# Patient Record
Sex: Female | Born: 1950
Health system: Southern US, Community
[De-identification: ages and names within clinical notes are randomized; demographics above are authoritative.]

## PROBLEM LIST (undated history)

## (undated) DIAGNOSIS — F419 Anxiety disorder, unspecified: Secondary | ICD-10-CM

## (undated) DIAGNOSIS — M1990 Unspecified osteoarthritis, unspecified site: Secondary | ICD-10-CM

## (undated) DIAGNOSIS — E785 Hyperlipidemia, unspecified: Secondary | ICD-10-CM

## (undated) DIAGNOSIS — T7840XA Allergy, unspecified, initial encounter: Secondary | ICD-10-CM

## (undated) DIAGNOSIS — Z86010 Personal history of colonic polyps: Principal | ICD-10-CM

## (undated) DIAGNOSIS — E079 Disorder of thyroid, unspecified: Secondary | ICD-10-CM

## (undated) DIAGNOSIS — R569 Unspecified convulsions: Secondary | ICD-10-CM

## (undated) DIAGNOSIS — R413 Other amnesia: Secondary | ICD-10-CM

## (undated) DIAGNOSIS — E119 Type 2 diabetes mellitus without complications: Secondary | ICD-10-CM

## (undated) DIAGNOSIS — I10 Essential (primary) hypertension: Secondary | ICD-10-CM

## (undated) DIAGNOSIS — F410 Panic disorder [episodic paroxysmal anxiety] without agoraphobia: Secondary | ICD-10-CM

## (undated) HISTORY — DX: Disorder of thyroid, unspecified: E07.9

## (undated) HISTORY — DX: Panic disorder (episodic paroxysmal anxiety): F41.0

## (undated) HISTORY — PX: BUNIONECTOMY: SHX129

## (undated) HISTORY — PX: POLYPECTOMY: SHX149

## (undated) HISTORY — PX: TONSILLECTOMY: SUR1361

## (undated) HISTORY — DX: Anxiety disorder, unspecified: F41.9

## (undated) HISTORY — DX: Other amnesia: R41.3

## (undated) HISTORY — DX: Hyperlipidemia, unspecified: E78.5

## (undated) HISTORY — PX: COLONOSCOPY: SHX174

## (undated) HISTORY — DX: Essential (primary) hypertension: I10

## (undated) HISTORY — DX: Unspecified osteoarthritis, unspecified site: M19.90

## (undated) HISTORY — DX: Allergy, unspecified, initial encounter: T78.40XA

## (undated) HISTORY — DX: Personal history of colonic polyps: Z86.010

## (undated) HISTORY — DX: Type 2 diabetes mellitus without complications: E11.9

## (undated) HISTORY — DX: Unspecified convulsions: R56.9

## (undated) HISTORY — PX: THYROIDECTOMY, PARTIAL: SHX18

---

## 1998-04-20 ENCOUNTER — Encounter: Admission: RE | Admit: 1998-04-20 | Discharge: 1998-04-20 | Payer: Self-pay | Admitting: Family Medicine

## 1998-05-25 ENCOUNTER — Encounter: Admission: RE | Admit: 1998-05-25 | Discharge: 1998-05-25 | Payer: Self-pay | Admitting: Family Medicine

## 1998-12-26 ENCOUNTER — Encounter: Admission: RE | Admit: 1998-12-26 | Discharge: 1998-12-26 | Payer: Self-pay | Admitting: Sports Medicine

## 1999-02-06 ENCOUNTER — Encounter: Admission: RE | Admit: 1999-02-06 | Discharge: 1999-02-06 | Payer: Self-pay | Admitting: Sports Medicine

## 1999-03-20 ENCOUNTER — Encounter: Admission: RE | Admit: 1999-03-20 | Discharge: 1999-03-20 | Payer: Self-pay | Admitting: Sports Medicine

## 1999-05-16 ENCOUNTER — Encounter: Admission: RE | Admit: 1999-05-16 | Discharge: 1999-05-16 | Payer: Self-pay | Admitting: Family Medicine

## 1999-08-01 ENCOUNTER — Encounter: Admission: RE | Admit: 1999-08-01 | Discharge: 1999-08-01 | Payer: Self-pay | Admitting: Sports Medicine

## 1999-10-12 ENCOUNTER — Encounter: Admission: RE | Admit: 1999-10-12 | Discharge: 1999-10-12 | Payer: Self-pay | Admitting: Family Medicine

## 2000-02-28 ENCOUNTER — Encounter: Admission: RE | Admit: 2000-02-28 | Discharge: 2000-02-28 | Payer: Self-pay | Admitting: Family Medicine

## 2000-10-23 ENCOUNTER — Encounter: Admission: RE | Admit: 2000-10-23 | Discharge: 2000-10-23 | Payer: Self-pay | Admitting: Family Medicine

## 2000-10-24 ENCOUNTER — Encounter: Admission: RE | Admit: 2000-10-24 | Discharge: 2000-10-24 | Payer: Self-pay | Admitting: Sports Medicine

## 2000-11-27 ENCOUNTER — Encounter (INDEPENDENT_AMBULATORY_CARE_PROVIDER_SITE_OTHER): Payer: Self-pay | Admitting: *Deleted

## 2000-11-27 LAB — CONVERTED CEMR LAB

## 2000-12-09 ENCOUNTER — Encounter: Admission: RE | Admit: 2000-12-09 | Discharge: 2000-12-09 | Payer: Self-pay | Admitting: Family Medicine

## 2000-12-31 ENCOUNTER — Encounter: Admission: RE | Admit: 2000-12-31 | Discharge: 2000-12-31 | Payer: Self-pay | Admitting: Sports Medicine

## 2001-01-28 ENCOUNTER — Encounter: Admission: RE | Admit: 2001-01-28 | Discharge: 2001-01-28 | Payer: Self-pay | Admitting: Family Medicine

## 2001-01-31 ENCOUNTER — Encounter: Payer: Self-pay | Admitting: Family Medicine

## 2001-01-31 ENCOUNTER — Encounter: Admission: RE | Admit: 2001-01-31 | Discharge: 2001-01-31 | Payer: Self-pay | Admitting: Family Medicine

## 2001-02-12 ENCOUNTER — Encounter: Admission: RE | Admit: 2001-02-12 | Discharge: 2001-02-12 | Payer: Self-pay | Admitting: Family Medicine

## 2001-07-23 ENCOUNTER — Encounter: Admission: RE | Admit: 2001-07-23 | Discharge: 2001-07-23 | Payer: Self-pay | Admitting: Family Medicine

## 2001-08-07 ENCOUNTER — Encounter: Admission: RE | Admit: 2001-08-07 | Discharge: 2001-08-07 | Payer: Self-pay | Admitting: Family Medicine

## 2001-10-28 ENCOUNTER — Encounter: Admission: RE | Admit: 2001-10-28 | Discharge: 2001-10-28 | Payer: Self-pay | Admitting: Family Medicine

## 2001-11-06 ENCOUNTER — Encounter: Admission: RE | Admit: 2001-11-06 | Discharge: 2001-11-06 | Payer: Self-pay | Admitting: Sports Medicine

## 2001-11-06 ENCOUNTER — Encounter: Payer: Self-pay | Admitting: Sports Medicine

## 2001-11-26 DIAGNOSIS — G9349 Other encephalopathy: Secondary | ICD-10-CM | POA: Insufficient documentation

## 2002-02-16 ENCOUNTER — Encounter: Admission: RE | Admit: 2002-02-16 | Discharge: 2002-02-16 | Payer: Self-pay | Admitting: Sports Medicine

## 2002-02-27 ENCOUNTER — Encounter: Admission: RE | Admit: 2002-02-27 | Discharge: 2002-02-27 | Payer: Self-pay | Admitting: Family Medicine

## 2002-03-02 ENCOUNTER — Encounter: Admission: RE | Admit: 2002-03-02 | Discharge: 2002-03-02 | Payer: Self-pay | Admitting: Family Medicine

## 2002-03-03 ENCOUNTER — Inpatient Hospital Stay (HOSPITAL_COMMUNITY): Admission: EM | Admit: 2002-03-03 | Discharge: 2002-03-07 | Payer: Self-pay | Admitting: Emergency Medicine

## 2002-03-03 ENCOUNTER — Encounter: Payer: Self-pay | Admitting: Emergency Medicine

## 2002-03-04 DIAGNOSIS — R413 Other amnesia: Secondary | ICD-10-CM

## 2002-03-04 HISTORY — DX: Other amnesia: R41.3

## 2002-03-06 ENCOUNTER — Encounter: Payer: Self-pay | Admitting: *Deleted

## 2002-03-12 ENCOUNTER — Encounter: Admission: RE | Admit: 2002-03-12 | Discharge: 2002-03-12 | Payer: Self-pay | Admitting: Sports Medicine

## 2002-08-19 ENCOUNTER — Encounter: Admission: RE | Admit: 2002-08-19 | Discharge: 2002-08-19 | Payer: Self-pay | Admitting: Family Medicine

## 2002-11-09 ENCOUNTER — Encounter: Admission: RE | Admit: 2002-11-09 | Discharge: 2002-11-09 | Payer: Self-pay | Admitting: Family Medicine

## 2002-12-03 ENCOUNTER — Encounter: Payer: Self-pay | Admitting: Sports Medicine

## 2002-12-03 ENCOUNTER — Encounter: Admission: RE | Admit: 2002-12-03 | Discharge: 2002-12-03 | Payer: Self-pay | Admitting: Sports Medicine

## 2002-12-08 ENCOUNTER — Encounter: Admission: RE | Admit: 2002-12-08 | Discharge: 2002-12-08 | Payer: Self-pay | Admitting: Sports Medicine

## 2003-02-04 ENCOUNTER — Encounter: Admission: RE | Admit: 2003-02-04 | Discharge: 2003-05-05 | Payer: Self-pay | Admitting: Sports Medicine

## 2003-03-25 ENCOUNTER — Encounter: Admission: RE | Admit: 2003-03-25 | Discharge: 2003-03-25 | Payer: Self-pay | Admitting: Family Medicine

## 2003-10-06 ENCOUNTER — Encounter: Admission: RE | Admit: 2003-10-06 | Discharge: 2003-10-06 | Payer: Self-pay | Admitting: Family Medicine

## 2004-01-19 ENCOUNTER — Encounter: Admission: RE | Admit: 2004-01-19 | Discharge: 2004-01-19 | Payer: Self-pay | Admitting: Family Medicine

## 2004-02-22 ENCOUNTER — Encounter: Admission: RE | Admit: 2004-02-22 | Discharge: 2004-02-22 | Payer: Self-pay | Admitting: Sports Medicine

## 2004-03-07 ENCOUNTER — Encounter: Admission: RE | Admit: 2004-03-07 | Discharge: 2004-03-07 | Payer: Self-pay | Admitting: Sports Medicine

## 2004-06-12 ENCOUNTER — Encounter: Admission: RE | Admit: 2004-06-12 | Discharge: 2004-06-12 | Payer: Self-pay | Admitting: Family Medicine

## 2004-07-10 ENCOUNTER — Emergency Department (HOSPITAL_COMMUNITY): Admission: EM | Admit: 2004-07-10 | Discharge: 2004-07-10 | Payer: Self-pay | Admitting: Podiatry

## 2004-08-17 ENCOUNTER — Ambulatory Visit: Payer: Self-pay | Admitting: Family Medicine

## 2005-07-06 ENCOUNTER — Ambulatory Visit: Payer: Self-pay | Admitting: Family Medicine

## 2005-08-25 ENCOUNTER — Inpatient Hospital Stay (HOSPITAL_COMMUNITY): Admission: EM | Admit: 2005-08-25 | Discharge: 2005-08-28 | Payer: Self-pay | Admitting: Emergency Medicine

## 2005-08-25 ENCOUNTER — Ambulatory Visit: Payer: Self-pay | Admitting: Family Medicine

## 2005-09-04 ENCOUNTER — Ambulatory Visit: Payer: Self-pay | Admitting: Sports Medicine

## 2006-05-09 ENCOUNTER — Ambulatory Visit: Payer: Self-pay | Admitting: Family Medicine

## 2006-12-04 ENCOUNTER — Ambulatory Visit: Payer: Self-pay | Admitting: Family Medicine

## 2006-12-25 ENCOUNTER — Ambulatory Visit: Payer: Self-pay | Admitting: Family Medicine

## 2007-01-23 DIAGNOSIS — G43909 Migraine, unspecified, not intractable, without status migrainosus: Secondary | ICD-10-CM | POA: Insufficient documentation

## 2007-01-23 DIAGNOSIS — I10 Essential (primary) hypertension: Secondary | ICD-10-CM | POA: Insufficient documentation

## 2007-01-23 DIAGNOSIS — E039 Hypothyroidism, unspecified: Secondary | ICD-10-CM | POA: Insufficient documentation

## 2007-01-23 DIAGNOSIS — M1990 Unspecified osteoarthritis, unspecified site: Secondary | ICD-10-CM | POA: Insufficient documentation

## 2007-01-23 DIAGNOSIS — D649 Anemia, unspecified: Secondary | ICD-10-CM | POA: Insufficient documentation

## 2007-01-23 DIAGNOSIS — I152 Hypertension secondary to endocrine disorders: Secondary | ICD-10-CM | POA: Insufficient documentation

## 2007-01-23 DIAGNOSIS — E785 Hyperlipidemia, unspecified: Secondary | ICD-10-CM | POA: Insufficient documentation

## 2007-01-23 DIAGNOSIS — R4189 Other symptoms and signs involving cognitive functions and awareness: Secondary | ICD-10-CM | POA: Insufficient documentation

## 2007-01-24 ENCOUNTER — Encounter (INDEPENDENT_AMBULATORY_CARE_PROVIDER_SITE_OTHER): Payer: Self-pay | Admitting: *Deleted

## 2007-03-24 ENCOUNTER — Telehealth (INDEPENDENT_AMBULATORY_CARE_PROVIDER_SITE_OTHER): Payer: Self-pay | Admitting: *Deleted

## 2007-03-25 ENCOUNTER — Ambulatory Visit: Payer: Self-pay | Admitting: Family Medicine

## 2007-04-15 ENCOUNTER — Ambulatory Visit: Payer: Self-pay | Admitting: Family Medicine

## 2007-04-17 ENCOUNTER — Ambulatory Visit: Payer: Self-pay | Admitting: Sports Medicine

## 2007-07-17 ENCOUNTER — Encounter: Payer: Self-pay | Admitting: *Deleted

## 2007-07-22 ENCOUNTER — Encounter (INDEPENDENT_AMBULATORY_CARE_PROVIDER_SITE_OTHER): Payer: Self-pay | Admitting: Family Medicine

## 2007-08-13 ENCOUNTER — Ambulatory Visit: Payer: Self-pay | Admitting: Family Medicine

## 2007-08-13 ENCOUNTER — Telehealth (INDEPENDENT_AMBULATORY_CARE_PROVIDER_SITE_OTHER): Payer: Self-pay | Admitting: *Deleted

## 2007-09-03 ENCOUNTER — Ambulatory Visit: Payer: Self-pay | Admitting: Family Medicine

## 2007-09-03 DIAGNOSIS — F411 Generalized anxiety disorder: Secondary | ICD-10-CM | POA: Insufficient documentation

## 2007-09-04 ENCOUNTER — Encounter: Payer: Self-pay | Admitting: *Deleted

## 2008-03-24 ENCOUNTER — Telehealth: Payer: Self-pay | Admitting: *Deleted

## 2008-03-25 ENCOUNTER — Ambulatory Visit: Payer: Self-pay | Admitting: Family Medicine

## 2008-03-30 ENCOUNTER — Telehealth (INDEPENDENT_AMBULATORY_CARE_PROVIDER_SITE_OTHER): Payer: Self-pay | Admitting: Family Medicine

## 2008-06-18 ENCOUNTER — Telehealth: Payer: Self-pay | Admitting: *Deleted

## 2008-06-30 ENCOUNTER — Encounter: Payer: Self-pay | Admitting: Family Medicine

## 2008-06-30 ENCOUNTER — Ambulatory Visit: Payer: Self-pay | Admitting: Family Medicine

## 2008-07-07 ENCOUNTER — Encounter: Payer: Self-pay | Admitting: Family Medicine

## 2009-01-01 ENCOUNTER — Inpatient Hospital Stay (HOSPITAL_COMMUNITY): Admission: EM | Admit: 2009-01-01 | Discharge: 2009-01-03 | Payer: Self-pay | Admitting: Emergency Medicine

## 2009-01-01 ENCOUNTER — Ambulatory Visit: Payer: Self-pay | Admitting: Family Medicine

## 2009-01-03 ENCOUNTER — Encounter: Payer: Self-pay | Admitting: Family Medicine

## 2009-01-13 ENCOUNTER — Ambulatory Visit: Payer: Self-pay | Admitting: Family Medicine

## 2009-04-18 ENCOUNTER — Telehealth: Payer: Self-pay | Admitting: Family Medicine

## 2009-05-02 ENCOUNTER — Telehealth: Payer: Self-pay | Admitting: Family Medicine

## 2009-05-16 ENCOUNTER — Telehealth: Payer: Self-pay | Admitting: Family Medicine

## 2009-05-19 ENCOUNTER — Telehealth: Payer: Self-pay | Admitting: *Deleted

## 2009-06-03 ENCOUNTER — Encounter: Payer: Self-pay | Admitting: *Deleted

## 2009-06-03 ENCOUNTER — Encounter: Payer: Self-pay | Admitting: Family Medicine

## 2009-06-03 ENCOUNTER — Ambulatory Visit: Payer: Self-pay | Admitting: Family Medicine

## 2009-06-03 LAB — CONVERTED CEMR LAB
BUN: 13 mg/dL (ref 6–23)
CO2: 29 meq/L (ref 19–32)
Calcium: 10 mg/dL (ref 8.4–10.5)
Chloride: 97 meq/L (ref 96–112)
Cholesterol: 320 mg/dL — ABNORMAL HIGH (ref 0–200)
Creatinine, Ser: 0.76 mg/dL (ref 0.40–1.20)
Glucose, Bld: 96 mg/dL (ref 70–99)
HDL: 69 mg/dL (ref >39)
LDL Cholesterol: 238 mg/dL — ABNORMAL HIGH (ref 0–99)
Potassium: 4.3 meq/L (ref 3.5–5.3)
Sodium: 138 meq/L (ref 135–145)
TSH: 8.355 microintl units/mL — ABNORMAL HIGH (ref 0.350–4.500)
Total CHOL/HDL Ratio: 4.6
Triglycerides: 63 mg/dL (ref <150)
VLDL: 13 mg/dL (ref 0–40)

## 2009-06-07 ENCOUNTER — Encounter: Payer: Self-pay | Admitting: Family Medicine

## 2009-09-13 ENCOUNTER — Ambulatory Visit: Payer: Self-pay | Admitting: Family Medicine

## 2009-09-13 DIAGNOSIS — L02519 Cutaneous abscess of unspecified hand: Secondary | ICD-10-CM | POA: Insufficient documentation

## 2009-09-13 DIAGNOSIS — F339 Major depressive disorder, recurrent, unspecified: Secondary | ICD-10-CM | POA: Insufficient documentation

## 2009-09-13 DIAGNOSIS — L03019 Cellulitis of unspecified finger: Secondary | ICD-10-CM

## 2009-09-13 DIAGNOSIS — F32A Depression, unspecified: Secondary | ICD-10-CM | POA: Insufficient documentation

## 2009-09-13 DIAGNOSIS — F329 Major depressive disorder, single episode, unspecified: Secondary | ICD-10-CM

## 2009-12-15 ENCOUNTER — Encounter: Payer: Self-pay | Admitting: Family Medicine

## 2009-12-15 ENCOUNTER — Ambulatory Visit: Payer: Self-pay | Admitting: Family Medicine

## 2009-12-15 ENCOUNTER — Telehealth (INDEPENDENT_AMBULATORY_CARE_PROVIDER_SITE_OTHER): Payer: Self-pay | Admitting: *Deleted

## 2009-12-19 ENCOUNTER — Ambulatory Visit (HOSPITAL_COMMUNITY): Admission: RE | Admit: 2009-12-19 | Discharge: 2009-12-19 | Payer: Self-pay | Admitting: Family Medicine

## 2009-12-19 LAB — CONVERTED CEMR LAB
ALT: 13 units/L (ref 0–35)
AST: 18 units/L (ref 0–37)
Albumin: 4.6 g/dL (ref 3.5–5.2)
Alkaline Phosphatase: 73 units/L (ref 39–117)
BUN: 11 mg/dL (ref 6–23)
Basophils Absolute: 0 10*3/uL (ref 0.0–0.1)
Basophils Relative: 0 % (ref 0–1)
CO2: 28 meq/L (ref 19–32)
Calcium: 9.7 mg/dL (ref 8.4–10.5)
Chloride: 95 meq/L — ABNORMAL LOW (ref 96–112)
Creatinine, Ser: 0.74 mg/dL (ref 0.40–1.20)
Eosinophils Absolute: 0.1 10*3/uL (ref 0.0–0.7)
Eosinophils Relative: 1 % (ref 0–5)
Glucose, Bld: 111 mg/dL — ABNORMAL HIGH (ref 70–99)
HCT: 43.2 % (ref 36.0–46.0)
Hemoglobin: 14 g/dL (ref 12.0–15.0)
Lymphocytes Relative: 10 % — ABNORMAL LOW (ref 12–46)
Lymphs Abs: 0.6 10*3/uL — ABNORMAL LOW (ref 0.7–4.0)
MCHC: 32.4 g/dL (ref 30.0–36.0)
MCV: 94.3 fL (ref 78.0–100.0)
Monocytes Absolute: 0.2 10*3/uL (ref 0.1–1.0)
Monocytes Relative: 4 % (ref 3–12)
Neutro Abs: 5.5 10*3/uL (ref 1.7–7.7)
Neutrophils Relative %: 85 % — ABNORMAL HIGH (ref 43–77)
Platelets: 278 10*3/uL (ref 150–400)
Potassium: 3.6 meq/L (ref 3.5–5.3)
RBC: 4.58 M/uL (ref 3.87–5.11)
RDW: 13.7 % (ref 11.5–15.5)
Sodium: 137 meq/L (ref 135–145)
TSH: 7.157 microintl units/mL — ABNORMAL HIGH (ref 0.350–4.500)
Total Bilirubin: 0.6 mg/dL (ref 0.3–1.2)
Total Protein: 8.3 g/dL (ref 6.0–8.3)
WBC: 6.4 10*3/uL (ref 4.0–10.5)

## 2010-01-05 ENCOUNTER — Ambulatory Visit: Payer: Self-pay | Admitting: Family Medicine

## 2010-01-05 DIAGNOSIS — G479 Sleep disorder, unspecified: Secondary | ICD-10-CM | POA: Insufficient documentation

## 2010-02-08 ENCOUNTER — Ambulatory Visit: Payer: Self-pay | Admitting: Family Medicine

## 2010-02-08 LAB — CONVERTED CEMR LAB: Pap Smear: NEGATIVE

## 2010-02-09 ENCOUNTER — Encounter: Payer: Self-pay | Admitting: Family Medicine

## 2010-02-20 ENCOUNTER — Telehealth: Payer: Self-pay | Admitting: Family Medicine

## 2010-02-23 ENCOUNTER — Ambulatory Visit: Payer: Self-pay | Admitting: Family Medicine

## 2010-02-23 DIAGNOSIS — R569 Unspecified convulsions: Secondary | ICD-10-CM

## 2010-02-23 HISTORY — DX: Unspecified convulsions: R56.9

## 2010-03-23 ENCOUNTER — Ambulatory Visit: Payer: Self-pay | Admitting: Family Medicine

## 2010-03-24 ENCOUNTER — Telehealth: Payer: Self-pay | Admitting: Family Medicine

## 2010-05-23 ENCOUNTER — Ambulatory Visit: Payer: Self-pay | Admitting: Family Medicine

## 2010-05-25 ENCOUNTER — Ambulatory Visit: Payer: Self-pay | Admitting: Family Medicine

## 2010-05-30 ENCOUNTER — Ambulatory Visit: Payer: Self-pay | Admitting: Family Medicine

## 2010-06-22 ENCOUNTER — Telehealth: Payer: Self-pay | Admitting: Family Medicine

## 2010-06-28 ENCOUNTER — Telehealth: Payer: Self-pay | Admitting: Family Medicine

## 2010-07-12 ENCOUNTER — Telehealth: Payer: Self-pay | Admitting: Family Medicine

## 2010-08-10 ENCOUNTER — Encounter: Payer: Self-pay | Admitting: Family Medicine

## 2010-10-06 ENCOUNTER — Encounter: Payer: Self-pay | Admitting: Family Medicine

## 2010-12-17 ENCOUNTER — Encounter: Payer: Self-pay | Admitting: Family Medicine

## 2010-12-17 ENCOUNTER — Encounter: Payer: Self-pay | Admitting: Sports Medicine

## 2010-12-26 NOTE — Discharge Summary (Signed)
Summary: Question of seziure activity  Date of admission: 01/01/2009 Date of discharge: 01/03/2009  Primary Care Physician: Dr. Angelena Sole, [facility]  Disharge Diagnoses:  1) Question of seizure disorder 2) Short term memory loss 3) Hypothyroidism 4) Anxiety 5) Depression 6) HTN 7) Hyperlipidemia  Discharge Medications: 1) HCTZ 25mg  PO qday 2) Zocor 80 mg PO qhs 3) Klonopin 0.5 mg PO TID  4) Tylenol 325 mg, two tabs PO q 4 hrs prn pain 5) Lexapro 20 mg PO qday 6) Seroquel 50 mg PO qhs 7) Albuterol 1-2 puffs q 4hrs prn wheezing, dyspnea  Discontinued medications: 1) Amlodipine 2) Imitrex  Consults: 1) Neurology - Dr. Christian Mate   Labs:  On admission: UDS +ve for benzodiazepines, otherwise negative. Electrolytes within normal limits except for K+ 3.1. TSH within normal limits at 1.329.  Hospital course: Electrolytes within normal limits on discharge with K+ 4.5 after supplementation.   Studies: 1) MRI brain with and without contrast: 01/02/09: Normal exam  2) EEG: 01/03/2009: Normal EEG activity except for medication side effect beta fast activity.  Brief synopsis: This is a 60 year old female PMH significant for short term memory loss, questionable seizure disorder, HTN, anxiety and depression, who presented following 5 episodes of generalized tonic clonic seizure like acticity on day of admission.   1) Question of seizure-like activity: At the top of the differential diagnosis based on patient history was benzodiazepine withdrawal given the fact that patient had run out of her Klonopin approximately 5 days ago. Primary seizure disorder was also on the differential although there is a question as to whether her prior seizure events are actual seizures or a manifestation of her anxiety. Patient has been worked up in the past for seizure like activity with previously negative results. Patient initially received Ativan x 1 dose in the ED, then was  restarted on her home dose of Klonopin as above. Patient's Imitrex, Lexapro and Seroquel were held on admission given their potential for lowering the seizure threshold. Neurology was consulted and EEG was recommended. Patient did not have any seizure-like episodes while in-house and had a negative EEG as above. Patient was restarted on her Lexapro and Seroquel but her Imitrex was held for discharge. Patient will follow up at [facility] for outpatient evaluation in one month.   2) Anxiety/Depression: Patienrt was restarted on Klonopin as above and though initially anxious on admission, was stable with regards to this problem on discharge. Also discharged on home dose seroquel, and lexapro for depression.   3) Short term memory loss: Patient has been worked up in the past for this issue but etiology remains unclear. This problem was stable over hospitalization.  4) HTN: Patient's BPs were stable on HCTZ over the course of her hospitalization.   5) HLD: Patient was continued on her home dose Zocor. This problem was stable over hospitalization.  6) Hypothyroidism history: TSH was within normal limits. No medications were started for this issue.   Follow up: Patient is to follow up with Dr. Lelon Perla on 01/13/09 at 2:10 PM Patient is to follow up with [facility] in one month for possible outptaient 24 hour EEG.   Patient was discharged to home in stable and improved condition.

## 2010-12-26 NOTE — Assessment & Plan Note (Signed)
Summary: finger swollen,tcb   Vital Signs:  Patient profile:   60 year old female Height:      63 inches Weight:      150 pounds BMI:     26.67 BSA:     1.71 Temp:     98.1 degrees F Pulse rate:   88 / minute BP sitting:   112 / 76  Vitals Entered By: Jone Baseman CMA (May 23, 2010 1:52 PM) CC: finger swelling  Is Patient Diabetic? No Pain Assessment Patient in pain? yes     Location: finger Intensity: 4   Primary Care Provider:  Angelena Sole MD  CC:  finger swelling .  History of Present Illness: 1) Finger swelling: Patient unsure as to how long swollen - initially reports "three days" then states "maybe two weeks". RIGHT hand, third digit. Reports that she picks at it when she is nervous. Reports that she noticed a "pus pocket" at the edge of the nail. Now she is having pain and swelling in the entire finger, her right hand and she just noticed redness and swelling in her forearm while in the office. Also reports fatigue and chills for "past 10 days". Denies nausea, emesis, diarrhea, swelling elsewhere. Review of history reveals that patient has been seen for this problem in the past - it appears to be more severe this time.   Habits & Providers  Alcohol-Tobacco-Diet     Tobacco Status: never  Current Medications (verified): 1)  Hydrochlorothiazide 25 Mg Tabs (Hydrochlorothiazide) .... Take 1 Tablet By Mouth Once A Day 2)  Imitrex 100 Mg Tabs (Sumatriptan Succinate) .... Take 1 Tablet By Mouth As Directed 3)  Klonopin 0.5 Mg Tabs (Clonazepam) .... Take 1 Tablet By Mouth Three Times A Day As Needed 4)  Lexapro 20 Mg Tabs (Escitalopram Oxalate) .... Take 1 Tablet By Mouth Once A Day 5)  Seroquel 50 Mg  Tabs (Quetiapine Fumarate) .... Take 1/2 Tablet For 3 Days At Bedtime For Sleep and Mood, Then Increase To 1 Tablet At Bedtime For 4 Days, If Still No Improvement Take 2 Tablets At Edtime. 6)  Synthroid 125 Mcg Tabs (Levothyroxine Sodium) .Marland Kitchen.. 1 Tablet By Mouth Daily  For Hypothyroidism 7)  Hydroxyzine Hcl 25 Mg Tabs (Hydroxyzine Hcl) .... Take 1 Tab By Mouth Every 6 Hours As Needed For Itching 8)  Eye Drops Allergy Relief 0.05-0.25 % Soln (Tetrahydrozoline-Zn Sulfate) .... Apply 2 Drops in Each Eye Every 6 Hours As Needed For Itching / Redness Dispo: Qs For 1 Month 9)  Hydrocortisone 2.5 % Crea (Hydrocortisone) .... Apply To Neck Two Times A Day.  Disp #1 Tube. 10)  Keflex 500 Mg Caps (Cephalexin) .... One Tab By Mouth Four Times A Day X 10 Days  Allergies (verified): No Known Drug Allergies  Review of Systems       as per HPI o/e negative   Physical Exam  General:  alert, well-developed, and well-nourished, overweight, NAD vitals reviewed.  Extremities:  RIGHT 3rd fingernail completely absent; partially healed ulcerations either side of nail bed. Entire finger is erythematous and extremely swollen - swelling extends into palm. Erythema extends to forearm (line drawn with skin marker) . No drainable  areas of fluctuance noted.    Impression & Recommendations:  Problem # 1:  CELLULITIS, FINGER (ICD-681.00) Assessment New  Will treat with Keflex (per recommendation of precptor Dr. Caryl Never) to cover for strep species. No drainable abscess. Follow up in two days. Red flags that would prompt return to care  reviewed. Wound covered to prevent further interference with healing by patient. Also consider chronic fungal infection as possible cause (given prior occurence), though less likely.   Her updated medication list for this problem includes:    Keflex 500 Mg Caps (Cephalexin) ..... One tab by mouth four times a day x 10 days  Orders: [facility]- Est Level  3 (04540)  Complete Medication List: 1)  Hydrochlorothiazide 25 Mg Tabs (Hydrochlorothiazide) .... Take 1 tablet by mouth once a day 2)  Imitrex 100 Mg Tabs (Sumatriptan succinate) .... Take 1 tablet by mouth as directed 3)  Klonopin 0.5 Mg Tabs (Clonazepam) .... Take 1 tablet by mouth three times a  day as needed 4)  Lexapro 20 Mg Tabs (Escitalopram oxalate) .... Take 1 tablet by mouth once a day 5)  Seroquel 50 Mg Tabs (Quetiapine fumarate) .... Take 1/2 tablet for 3 days at bedtime for sleep and mood, then increase to 1 tablet at bedtime for 4 days, if still no improvement take 2 tablets at edtime. 6)  Synthroid 125 Mcg Tabs (Levothyroxine sodium) .Marland Kitchen.. 1 tablet by mouth daily for hypothyroidism 7)  Hydroxyzine Hcl 25 Mg Tabs (Hydroxyzine hcl) .... Take 1 tab by mouth every 6 hours as needed for itching 8)  Eye Drops Allergy Relief 0.05-0.25 % Soln (Tetrahydrozoline-zn sulfate) .... Apply 2 drops in each eye every 6 hours as needed for itching / redness dispo: qs for 1 month 9)  Hydrocortisone 2.5 % Crea (Hydrocortisone) .... Apply to neck two times a day.  disp #1 tube. 10)  Keflex 500 Mg Caps (Cephalexin) .... One tab by mouth four times a day x 10 days  Patient Instructions: 1)  It was great to see you today!  2)  Make an appointment to be seen in two days. 3)  If you notice that the swelling and redness becomes worse then follow up tomorrow.  4)  Pick up the prescription for the antibiotic and take as directed.  5)  Stop picking at your finger - we will wrap it today to prevent you from picking at it further.  Prescriptions: KEFLEX 500 MG CAPS (CEPHALEXIN) one tab by mouth four times a day x 10 days  #40 x 0   Entered and Authorized by:   Bobby Rumpf  MD   Signed by:   Bobby Rumpf  MD on 05/23/2010   Method used:   Electronically to        [facility] [phone]* (retail)       [address]       [place], [place]  [address]       Ph: [phone]       Fax: [phone]   RxID:   [id number]

## 2010-12-26 NOTE — Assessment & Plan Note (Signed)
Summary: rash on neck,tcb   Vital Signs:  Patient profile:   60 year old female Height:      63 inches Weight:      153.3 pounds BMI:     27.25 Temp:     98.3 degrees F oral Pulse rate:   84 / minute BP sitting:   129 / 73  (left arm) Cuff size:   regular  Vitals Entered By: Jone Baseman CMA (March 23, 2010 9:51 AM) CC: rash on neck Is Patient Diabetic? No Pain Assessment Patient in pain? no        Primary Care Provider:  Angelena Sole MD  CC:  rash on neck.  History of Present Illness: PRURITUS (ICD-698.9) -- 3 month h/o pruritus and scratching on bilateral neck.  Scratches constantly.  Is applying multiple topicals to it daily at bedtime:  Scar Gel, Banish Cream, Mercuroclear, New Skin, Antifungal, Triamcinolone, Tacrolimus.  Was prescribed Hydroxyzine last month for pruritis of the arms, but isn't sure if she is taking this or not.  INJURY, FINGER (ICD-959.5) -- 3rd digit, RIGHT hand.  Duration = years.  Is getting better now, but has torn the nail off--is healing.  She is aware that she picks at it constantly and this is the cause.  In the past she has also done this to other nails, but not currently.  Habits & Providers  Alcohol-Tobacco-Diet     Tobacco Status: never  Allergies (verified): No Known Drug Allergies  Physical Exam  General:  Well-developed,well-nourished,in no acute distress; alert,appropriate and cooperative throughout examination Extremities:  RIGHT 3rd fingernail:  partially avulsed on all sides, well-healed scabs.  Distal phalanx is swollen, nontender. Skin:  Excoriations on bilateral neck to upper chest.  Dry, scaly, few papules.   Impression & Recommendations:  Problem # 1:  PRURITUS (ICD-698.9) Assessment Deteriorated Suspect strong nervous habit, exacerbated now by multiple topicals.  Advised stop all topicals.  Start Hydrocortisone 2.5% cream two times a day and Eucerine cream.  Hydroxyzine for pruritus.  Follow up 2  weeks. Orders: [facility]- Est Level  3 ([id number])  Problem # 2:  INJURY, FINGER (ICD-959.5) Assessment: Deteriorated Clearly strong nervous habit, as with #1 above.  Patient will try to find other distracting things to divert her from this habit.  No signs of infection. Orders: [facility]- Est Level  3 ([id number])  Complete Medication List: 1)  Hydrochlorothiazide 25 Mg Tabs (Hydrochlorothiazide) .... Take 1 tablet by mouth once a day 2)  Imitrex 100 Mg Tabs (Sumatriptan succinate) .... Take 1 tablet by mouth as directed 3)  Klonopin 0.5 Mg Tabs (Clonazepam) .... Take 1 tablet by mouth three times a day as needed 4)  Lexapro 20 Mg Tabs (Escitalopram oxalate) .... Take 1 tablet by mouth once a day 5)  Seroquel 50 Mg Tabs (Quetiapine fumarate) .... Take 1/2 tablet for 3 days at bedtime for sleep and mood, then increase to 1 tablet at bedtime for 4 days, if still no improvement take 2 tablets at edtime. 6)  Synthroid 125 Mcg Tabs (Levothyroxine sodium) .Marland Kitchen.. 1 tablet by mouth daily for hypothyroidism 7)  Hydroxyzine Hcl 25 Mg Tabs (Hydroxyzine hcl) .... Take 1 tab by mouth every 6 hours as needed for itching 8)  Eye Drops Allergy Relief 0.05-0.25 % Soln (Tetrahydrozoline-zn sulfate) .... Apply 2 drops in each eye every 6 hours as needed for itching / redness dispo: qs for 1 month 9)  Hydrocortisone 2.5 % Crea (Hydrocortisone) .... Apply to neck two times a day.  disp #1 tube.  Patient Instructions: 1)  Pleasure to meet you and your sister today. 2)  I would use Hydrocortisone Cream 2.5% (prescription sent) two times a day.  Then cover it with Eucerine cream and NOTHING ELSE!  Please put away all the other skin products you have been using. 3)  Continue to take the Hydroxyzine already prescribed by Dr. Aaron Mose can be very effective for itching. 4)  Please schedule a follow-up appointment in 2 weeks with Dr. Lelon Perla.  Prescriptions: HYDROCORTISONE 2.5 % CREA (HYDROCORTISONE) Apply to neck two times a day.   Disp #1 tube.  #1 x 1   Entered and Authorized by:   Romero Belling MD   Signed by:   Romero Belling MD on 03/23/2010   Method used:   Electronically to        [facility] [phone]* (retail)       [address]       [place], [place]  [address]       Ph: [phone]       Fax: [phone]   RxID:   [id number]

## 2010-12-26 NOTE — Progress Notes (Signed)
Summary: refill prob  Phone Note Refill Request Message from:  Patient  Refills Requested: Medication #1:  KLONOPIN 0.5 MG TABS Take 1 tablet by mouth three times a day as needed pt is going on vacation today and needs before she leaves.  was called in 2 days ago - not sure why is taking so long.  Initial call taken by: De Nurse,  March 24, 2010 8:57 AM  Follow-up for Phone Call        no answer on pt phone. she is due for refill today. to Dr. Leveda Anna to see if he will write it Follow-up by: Golden Circle RN,  March 24, 2010 9:06 AM  Additional Follow-up for Phone Call Additional follow up Details #1::        done Additional Follow-up by: Doralee Albino MD,  March 24, 2010 11:26 AM    Additional Follow-up for Phone Call Additional follow up Details #2::    LM with husband that rx is at pharmacy Follow-up by: Golden Circle RN,  March 24, 2010 11:42 AM

## 2010-12-26 NOTE — Assessment & Plan Note (Signed)
Summary: DNKA    Current Allergies: No known allergies         Complete Medication List: 1)  Albuterol 90 Mcg/act Aers (Albuterol) .... Inhale 1-2 puff as directed every four to six hours 2)  Hydrochlorothiazide 25 Mg Tabs (Hydrochlorothiazide) .... Take 1 tablet by mouth once a day 3)  Imitrex 100 Mg Tabs (Sumatriptan succinate) .... Take 1 tablet by mouth as directed 4)  Klonopin 0.5 Mg Tabs (Clonazepam) .... Take 1 tablet by mouth three times a day 5)  Lexapro 20 Mg Tabs (Escitalopram oxalate) .... Take 1 tablet by mouth once a day 6)  Synthroid 125 Mcg Tabs (Levothyroxine sodium) .... Take 1 tablet by mouth once a day 7)  Zocor 80 Mg Tabs (Simvastatin) .... Take 1/2 tablet by mouth every night 8)  Norvasc 5 Mg Tabs (Amlodipine besylate) .Marland Kitchen.. 1 tablet by mouth for blood pressure.

## 2010-12-26 NOTE — Assessment & Plan Note (Signed)
Summary: READ PPD/[name]  Nurse Visit      PPD Results    Date of reading: 04/17/2007    Results: < 5mm    Interpretation: negative

## 2010-12-26 NOTE — Progress Notes (Signed)
Summary: refill  Phone Note Refill Request Call back at Home Phone [phone] Message from:  Patient  Refills Requested: Medication #1:  KLONOPIN 0.5 MG TABS Take 1 tablet by mouth three times a day [facility] [address]  Initial call taken by: De Nurse,  May 16, 2009 10:05 AM    Dealt with in 6/24 document Pearlean Brownie MD  May 19, 2009 2:02 PM

## 2010-12-26 NOTE — Letter (Signed)
Summary: Generic Letter  [facility]  [address]   [place], [place] [address]   Phone: [phone]  Fax: [phone]    06/07/2009  [name] [address] [place], [place]  [address]  Dear Ms. Soltis,   Here is a copy of your lab test results.  Looks like we need to make a few changes.  Your cholesterol is too high and your TSH is also too high, which means you need more thyroid hormone.  Please call and schedule an appt. to discuss at your convenience.  Tests: (1) Basic Metabolic Panel (95638)   Sodium                    138 mEq/L                   135-145   Potassium                 4.3 mEq/L                   3.5-5.3   Chloride                  97 mEq/L                    96-112   CO2                       29 mEq/L                    19-32   Glucose                   96 mg/dL                    75-64   BUN                       13 mg/dL                    3-32   Creatinine                0.76 mg/dL                  0.40-1.20   Calcium                   10.0 mg/dL                  9.5-18.8  Tests: (2) Lipid Profile (41660)   Cholesterol          [H]  320 mg/dL                   6-301     ATP III Classification:           < 200        mg/dL        Desirable          200 - 239     mg/dL        Borderline High          >= 240        mg/dL        High         Triglyceride              63 mg/dL                    <  150   HDL Cholesterol           69 mg/dL                    >04   Total Chol/HDL Ratio      4.6 Ratio  VLDL Cholesterol (Calc)                             13 mg/dL                    5-40  LDL Cholesterol (Calc)                        [H]  238 mg/dL                   9-81           Total Cholesterol/HDL Ratio:CHD Risk                            Coronary Heart Disease Risk Table                                            Men       Women              1/2 Average Risk              3.4        3.3                  Average Risk               5.0        4.4              2 X Average Risk              9.6        7.1              3 X Average Risk             23.4       11.0     Use the calculated Patient Ratio above and the CHD Risk table      to determine the patient's CHD Risk.     ATP III Classification (LDL):           < 100        mg/dL         Optimal          100 - 129     mg/dL         Near or Above Optimal          130 - 159     mg/dL         Borderline High          160 - 189     mg/dL         High           > 190        mg/dL         Very High        Tests: (3) TSH (23280)   TSH                  [  H]  8.355 uIU/mL                0.350-4.500   Sincerely,   Angelena Sole MD  Appended Document: Generic Letter mailed

## 2010-12-26 NOTE — Progress Notes (Signed)
Summary: Rx Req  Phone Note Refill Request Message from:  Patient  Refills Requested: Medication #1:  KLONOPIN 0.5 MG TABS Take 1 tablet by mouth three times a day [facility] ON [address].  Initial call taken by: Clydell Hakim,  Apr 18, 2009 9:08 AM Caller: Patient  Follow-up for Phone Call        will forward message to MD. Follow-up by: Theresia Lo RN,  Apr 18, 2009 9:13 AM

## 2010-12-26 NOTE — Assessment & Plan Note (Signed)
Summary: anxiety,tcb   Vital Signs:  Patient profile:   60 year old female Height:      63 inches Weight:      145 pounds BMI:     25.78 BSA:     1.69 Temp:     98.4 degrees F Pulse rate:   70 / minute BP sitting:   127 / 83  Vitals Entered By: Jone Baseman CMA (June 03, 2009 9:49 AM) CC: anxiety & memory loss   Primary Care Provider:  Wilhemina Bonito  MD  CC:  anxiety & memory loss.  History of Present Illness: 1. Anxiety:  Pt. is doing well.  Not complaining of a lot of anxiety.  Well controlled with Lexapro and Klonopin.  Denies any panic attacks.  Denies depressive symptoms.  2. Memory loss: Not any better.  Still having significant problems remembering anything new.  Still remembers things from before 2003.  Has to keep notes on her hand and in her notebook.  Her husband helps her remember things.  She doesn't think that she has been to the Neurologist in many years.    3. Hypothyroidism:  Denies weight gain, constipation, problems sleeping.  She hasn't been taking her synthroid for a while now but she doesn't know how long.  4. Hyperlipidemia:  Not taking Simvastatin.  Denies chest pain, claudication.   Physical Exam: Vital signs reviewed Gen: alert in no acute distress CV: regular rate and rhythm without murmurs Resp: clear to auscultation bilaterally, normal work of breathing Abd: soft, non-tender, non-distended Ext: no lower extremity edema Psych: not depressed appearing Neuro: no focal deficits, normal gait  Current Problems (verified): 1)  Seizure Disorder  (ICD-780.39) 2)  Unspecified Visual Disturbance  (ICD-368.9) 3)  Screening For Malignant Neoplasm of The Cervix  (ICD-V76.2) 4)  Special Screening For Malignant Neoplasms Colon  (ICD-V76.51) 5)  Anxiety State Nos  (ICD-300.00) 6)  Disorder, Adjustment W/depressed Mood  (ICD-309.0) 7)  Chest Wall Pain, Anterior  (ICD-786.52) 8)  Asthma, Cough Variant  (ICD-493.82) 9)  Migraine, Unspec., w/o Intractable  Migraine  (ICD-346.90) 10)  Memory Loss  (ICD-310.1) 11)  Hypothyroidism, Unspecified  (ICD-244.9) 12)  Hypertension, Benign Systemic  (ICD-401.1) 13)  Hyperlipidemia  (ICD-272.4) 14)  Djd, Unspecified  (ICD-715.90) 15)  Anemia, Other, Unspecified  (ICD-285.9)  Allergies: No Known Drug Allergies  Social History: Reviewed history from 09/03/2007 and no changes required. no tob/etoh/drugs. 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 Married- [employer], now disabled due to memory loss   Impression & Recommendations:  Problem # 1:  MEMORY LOSS (ICD-310.1) Assessment Unchanged  Will refer back to Neurology to see if they have any further recommendations.  Seen by Dr. Felisa Bonier at [facility] so will send back to them.  Orders: [facility]- Est  Level 4 (82423)  Problem # 2:  ANXIETY STATE NOS (ICD-300.00) Assessment: Unchanged  Well controlled Her updated medication list for this problem includes:    Klonopin 0.5 Mg Tabs (Clonazepam) .Marland Kitchen... Take 1 tablet by mouth three times a day as needed    Lexapro 20 Mg Tabs (Escitalopram oxalate) .Marland Kitchen... Take 1 tablet by mouth once a day  Orders: [facility]- Est  Level 4 (53614)  Problem # 3:  HYPOTHYROIDISM, UNSPECIFIED (ICD-244.9) Assessment: Unchanged Not taking Synthroid.  Will check TSH to see if she should be taking it. The following medications were removed from the medication list:    Synthroid 125 Mcg Tabs (Levothyroxine sodium) .Marland Kitchen... Take 1 tablet by  mouth once a day  Orders: TSH-[facility] ([address]) [facility]- Est  Level 4 (09811)  Problem # 4:  HYPERLIPIDEMIA (ICD-272.4) Assessment: Unchanged Not taking Zocor will check Lipids even though she doesn't remember if she ate anything this morning to see where she is The following medications were removed from the medication list:    Zocor 80 Mg Tabs (Simvastatin) .Marland Kitchen... Take 1/2 tablet by mouth every night  Orders: Lipid-[facility] ([address]78-29562) Basic Met-[facility] ([address]86-57846) [facility]- Est   Level 4 (96295)  Complete Medication List: 1)  Albuterol 90 Mcg/act Aers (Albuterol) .... Inhale 1-2 puff as directed every four to six hours 2)  Hydrochlorothiazide 25 Mg Tabs (Hydrochlorothiazide) .... Take 1 tablet by mouth once a day 3)  Imitrex 100 Mg Tabs (Sumatriptan succinate) .... Take 1 tablet by mouth as directed 4)  Klonopin 0.5 Mg Tabs (Clonazepam) .... Take 1 tablet by mouth three times a day as needed 5)  Lexapro 20 Mg Tabs (Escitalopram oxalate) .... Take 1 tablet by mouth once a day 6)  Seroquel 50 Mg Tabs (Quetiapine fumarate) .... Take 1/2 tablet for 3 days at bedtime for sleep and mood, then increase to 1 tablet at bedtime for 4 days, if still no improvement take 2 tablets at edtime.  Patient Instructions: 1)  We will schedule you an appt. with [facility] to reevaluate your memory loss 2)  I have refilled all of your medications 3)  I am checking your thyroid to see if you still need to be taking a thyroid medicine Prescriptions: KLONOPIN 0.5 MG TABS (CLONAZEPAM) Take 1 tablet by mouth three times a day as needed  #80 x 2   Entered and Authorized by:   Angelena Sole MD   Signed by:   Angelena Sole MD on 06/03/2009   Method used:   Print then Give to Patient   RxID:   [id number] SEROQUEL 50 MG  TABS (QUETIAPINE FUMARATE) take 1/2 tablet for 3 days at bedtime for sleep and mood, then increase to 1 tablet at bedtime for 4 days, if still no improvement take 2 tablets at edtime.  #60 x 3   Entered and Authorized by:   Angelena Sole MD   Signed by:   Angelena Sole MD on 06/03/2009   Method used:   Print then Give to Patient   RxID:   [id number] LEXAPRO 20 MG TABS (ESCITALOPRAM OXALATE) Take 1 tablet by mouth once a day  #60 x 6   Entered and Authorized by:   Angelena Sole MD   Signed by:   Angelena Sole MD on 06/03/2009   Method used:   Electronically to        [facility] [phone]* (retail)       [address]        [place], [place]  [address]       Ph: [phone]       Fax: [phone]   RxID:   [id number] IMITREX 100 MG TABS (SUMATRIPTAN SUCCINATE) Take 1 tablet by mouth as directed  #30 x 4   Entered and Authorized by:   Angelena Sole MD   Signed by:   Angelena Sole MD on 06/03/2009   Method used:   Electronically to        [facility] [phone]* (retail)       [address]       [place], [place]  [address]       Ph: [phone]       Fax:  [phone]   RxID:   [id number] HYDROCHLOROTHIAZIDE 25 MG TABS (HYDROCHLOROTHIAZIDE) Take 1 tablet by mouth once a day  #30 x 6   Entered and Authorized by:   Angelena Sole MD   Signed by:   Angelena Sole MD on 06/03/2009   Method used:   Electronically to        [facility] [phone]* (retail)       [address]       [place], [place]  [address]       Ph: [phone]       Fax: [phone]   RxID:   [id number]

## 2010-12-26 NOTE — Assessment & Plan Note (Signed)
Summary: F/U HAND/KH   Vital Signs:  Patient profile:   60 year old female Height:      63 inches Weight:      154.7 pounds BMI:     27.50 Temp:     98.0 degrees F oral Pulse rate:   63 / minute BP sitting:   125 / 82  (left arm) Cuff size:   regular  Vitals Entered By: Garen Grams LPN (May 31, 271 8:54 AM) CC: f/u infected finger Is Patient Diabetic? No Pain Assessment Patient in pain? no        Primary Care Provider:  Angelena Sole MD  CC:  f/u infected finger.  History of Present Illness: 1. F/U right finger cellulitis:  Right hand, third digit.  Diagnosed with infection / cellulitis about 7 days ago.  This is her second follow up.  The cellulitis initially was involving the skin all the way past her elbow.  It is now greatly improved.  She still has some redness of her right arm but the swelling, warmth is much better.  She has been trying not to pick at her finger and keep it covered.  She has been taking her antibiotics as prescribed.      ROS: denies fever, n/v/d, chills, decreased ROM  Habits & Providers  Alcohol-Tobacco-Diet     Tobacco Status: never  Current Medications (verified): 1)  Hydrochlorothiazide 25 Mg Tabs (Hydrochlorothiazide) .... Take 1 Tablet By Mouth Once A Day 2)  Imitrex 100 Mg Tabs (Sumatriptan Succinate) .... Take 1 Tablet By Mouth As Directed 3)  Klonopin 0.5 Mg Tabs (Clonazepam) .... Take 1 Tablet By Mouth Three Times A Day As Needed 4)  Lexapro 20 Mg Tabs (Escitalopram Oxalate) .... Take 1 Tablet By Mouth Once A Day 5)  Seroquel 50 Mg  Tabs (Quetiapine Fumarate) .... Take 1/2 Tablet For 3 Days At Bedtime For Sleep and Mood, Then Increase To 1 Tablet At Bedtime For 4 Days, If Still No Improvement Take 2 Tablets At Edtime. 6)  Synthroid 125 Mcg Tabs (Levothyroxine Sodium) .Marland Kitchen.. 1 Tablet By Mouth Daily For Hypothyroidism 7)  Hydroxyzine Hcl 25 Mg Tabs (Hydroxyzine Hcl) .... Take 1 Tab By Mouth Every 6 Hours As Needed For Itching 8)  Eye  Drops Allergy Relief 0.05-0.25 % Soln (Tetrahydrozoline-Zn Sulfate) .... Apply 2 Drops in Each Eye Every 6 Hours As Needed For Itching / Redness Dispo: Qs For 1 Month 9)  Hydrocortisone 2.5 % Crea (Hydrocortisone) .... Apply To Neck Two Times A Day.  Disp #1 Tube. 10)  Keflex 500 Mg Caps (Cephalexin) .... One Tab By Mouth Four Times A Day X 10 Days  Allergies: No Known Drug Allergies  Past History:  Past Medical History: Reviewed history from 01/05/2010 and no changes required. severe short term memory loss since 4/03 CT 2006 negative for acute process  MRI brain in 2006 :showed mild atropy and sm vessel disease, no acute stroke. no focal abnormalities in temporal lobes.  no abnormal enhancement. MRI brain 2010 unchanged Normal EEG 2006-2010 Neurologist: Dr. [facility]. 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 no tob/etoh/drugs. 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  Vtials normal. Lungs:  Normal respiratory effort, chest expands symmetrically. Lungs are clear to auscultation, no crackles or wheezes. Heart:  Normal rate and regular rhythm. S1 and S2 normal without gallop, murmur, click, rub or other extra sounds. Msk:  right middle finger distal phalanx is not tender to palpation.  The skin and nail have been picked back.  No active bleeding or discharge.  No redness / warmth. Extremities:  RIGHT 3rd fingernail completely absent; partially healed ulcerations either side of nail bed. Finger is slightly swollen with minimal erythema at the MCP joint.  Erythema on  forearm is decreased.  No drainable  areas of fluctuance noted.    Impression & Recommendations:  Problem # 1:  CELLULITIS, FINGER (ICD-681.00) Assessment Improved  Continues to improve.  There still is some signs of continued infection with redness in her forearm.  The swelling is much improved.  She has full ROM with all joints and there is no joint tenderness.  Will extend course of antibiotics to 14 days.  Follow up in 1 week. Her updated medication list for this problem includes:    Keflex 500 Mg Caps (Cephalexin) ..... One tab by mouth four times a day x 10 days  Orders: [facility]- Est Level  3 ([address])  Complete Medication List: 1)  Hydrochlorothiazide 25 Mg Tabs (Hydrochlorothiazide) .... Take 1 tablet by mouth once a day 2)  Imitrex 100 Mg Tabs (Sumatriptan succinate) .... Take 1 tablet by mouth as directed 3)  Klonopin 0.5 Mg Tabs (Clonazepam) .... Take 1 tablet by mouth three times a day as needed 4)  Lexapro 20 Mg Tabs (Escitalopram oxalate) .... Take 1 tablet by mouth once a day 5)  Seroquel 50 Mg Tabs (Quetiapine fumarate) .... Take 1/2 tablet for 3 days at bedtime for sleep and mood, then increase to 1 tablet at bedtime for 4 days, if still no improvement take 2 tablets at edtime. 6)  Synthroid 125 Mcg Tabs (Levothyroxine sodium) .Marland Kitchen.. 1 tablet by mouth daily for hypothyroidism 7)  Hydroxyzine Hcl 25 Mg Tabs (Hydroxyzine hcl) .... Take 1 tab by mouth every 6 hours as needed for itching 8)  Eye Drops Allergy Relief 0.05-0.25 % Soln (Tetrahydrozoline-zn sulfate) .... Apply 2 drops in each eye every 6 hours as needed for itching / redness dispo: qs for 1 month 9)  Hydrocortisone 2.5 % Crea (Hydrocortisone) .... Apply to neck two times a day.  disp #1 tube. 10)  Keflex 500 Mg Caps (Cephalexin) .... One tab by mouth four times a day x 10 days  Patient Instructions: 1)  I'm glad that your finger is doing so much better 2)  I would like for the redness and swelling to be completely  gone. 3)  I am going to give you another 4 days of the antibiotics to take 4)  Please schedule a follow up appointment in 1 week to recheck the infection 5)  If you develop fever or worsening redness / swelling you need to be seen here right away or go to the Emergency Room Prescriptions: KEFLEX 500 MG CAPS (CEPHALEXIN) one tab by mouth four times a day x 10 days  #16 x 0   Entered and Authorized by:   Angelena Sole MD   Signed by:   Angelena Sole MD on 05/30/2010   Method used:   Electronically to        [facility] [phone]* (retail)       [address]  [place], [place]  [address]       Ph: [phone]       Fax: [phone]   RxID:   [id number] KEFLEX 500 MG CAPS (CEPHALEXIN) one tab by mouth four times a day x 10 days  #10 x 0   Entered and Authorized by:   Angelena Sole MD   Signed by:   Angelena Sole MD on 05/30/2010   Method used:   Electronically to        [facility] [phone]* (retail)       [address]       [place], [place]  [address]       Ph: [phone]       Fax: [phone]   RxID:   [id number]

## 2010-12-26 NOTE — Miscellaneous (Signed)
Summary: DENIED KLONOPIN REFILL   Rec'd paper refill authorization request for Klonopin 0.5 mg three times a day as needed, 90 tabs  Last fill date 07/22/10  Not time for refills. Also script appears to have been given on 08/02/10 in office as well (see prior notes)  DENIED  Will send to PCP.  Bobby Rumpf  MD  August 10, 2010 10:31 AM

## 2010-12-26 NOTE — Miscellaneous (Signed)
Summary: Neurology appt  Clinical Lists Changes  Pt has missed 2 appts @ [facility].  So she must pay a $25 rescheduling fee before another appt can be made.  Pt made aware and info wrote down for her to give to her dgt or husband due to her memory loss. ............................................... Shanda Bumps [facility] June 03, 2009 10:28 AM

## 2010-12-26 NOTE — Assessment & Plan Note (Signed)
Summary: f/u per carew/eo   Vital Signs:  Patient profile:   60 year old female Height:      63 inches Weight:      152 pounds BMI:     27.02 BSA:     1.72 Temp:     97.9 degrees F Pulse rate:   68 / minute BP sitting:   104 / 67  Vitals Entered By: Jone Baseman CMA (May 25, 2010 9:58 AM) CC: f/u finger Is Patient Diabetic? No Pain Assessment Patient in pain? yes     Location: finger Intensity: 1   Primary Care Provider:  Angelena Sole MD  CC:  f/u finger.  History of Present Illness: 60 yo female here for f/u of RIGHT middle finger cellulitis.  Seen for same 2 days ago by Dr. Wallene Huh and started on Keflex.  Erythema outlined and advised to f/u 2 days.  She is here today stating that she feels it is much improved, swelling and pain are much decreased, she denies fevers and feels the erythema is decreasing.  Taking antiobiotics as prescribed.  Habits & Providers  Alcohol-Tobacco-Diet     Tobacco Status: never  Allergies (verified): No Known Drug Allergies  Review of Systems       Per HPI.  Physical Exam  General:  Well-developed,well-nourished,in no acute distress; alert,appropriate and cooperative throughout examination.  Vtials normal. Extremities:  RIGHT 3rd fingernail completely absent; partially healed ulcerations either side of nail bed. Entire finger is erythematous and swollen - swelling extends into palm. Erythema on forearm is decreased and inside borders drawn 2 days ago. No drainable  areas of fluctuance noted.    Impression & Recommendations:  Problem # 1:  CELLULITIS, FINGER (ICD-681.00) Assessment Improved  Improving.  Continue Keflex.  Follow up on Tuesday.  Precepted with Dr. Leveda Anna.  Red flags discussed.. Her updated medication list for this problem includes:    Keflex 500 Mg Caps (Cephalexin) ..... One tab by mouth four times a day x 10 days  Orders: [facility]- Est Level  3 ([address]109)  Complete Medication List: 1)  Hydrochlorothiazide 25 Mg  Tabs (Hydrochlorothiazide) .... Take 1 tablet by mouth once a day 2)  Imitrex 100 Mg Tabs (Sumatriptan succinate) .... Take 1 tablet by mouth as directed 3)  Klonopin 0.5 Mg Tabs (Clonazepam) .... Take 1 tablet by mouth three times a day as needed 4)  Lexapro 20 Mg Tabs (Escitalopram oxalate) .... Take 1 tablet by mouth once a day 5)  Seroquel 50 Mg Tabs (Quetiapine fumarate) .... Take 1/2 tablet for 3 days at bedtime for sleep and mood, then increase to 1 tablet at bedtime for 4 days, if still no improvement take 2 tablets at edtime. 6)  Synthroid 125 Mcg Tabs (Levothyroxine sodium) .Marland Kitchen.. 1 tablet by mouth daily for hypothyroidism 7)  Hydroxyzine Hcl 25 Mg Tabs (Hydroxyzine hcl) .... Take 1 tab by mouth every 6 hours as needed for itching 8)  Eye Drops Allergy Relief 0.05-0.25 % Soln (Tetrahydrozoline-zn sulfate) .... Apply 2 drops in each eye every 6 hours as needed for itching / redness dispo: qs for 1 month 9)  Hydrocortisone 2.5 % Crea (Hydrocortisone) .... Apply to neck two times a day.  disp #1 tube. 10)  Keflex 500 Mg Caps (Cephalexin) .... One tab by mouth four times a day x 10 days  Patient Instructions: 1)  Make an appointment to be seen on Tuesday to recheck your hand. 2)  If you get a fever or notice that  the swelling and redness becomes worse go to the emergency room immediately. 3)  Continue taking your antibiotic. 4)  Stop picking at your finger - we will wrap it today to prevent you from picking at it further.

## 2010-12-26 NOTE — Assessment & Plan Note (Signed)
Summary: f/u office visit   Vital Signs:  Patient profile:   60 year old female Height:      63 inches Weight:      155.5 pounds BMI:     27.65 Temp:     97.8 degrees F oral Pulse rate:   67 / minute BP sitting:   128 / 82  (right arm) Cuff size:   regular  Vitals Entered By: Garen Grams LPN (January 05, 2010 2:20 PM) CC: Itching, Headache, finger pain, anxiety Is Patient Diabetic? No Pain Assessment Patient in pain? yes     Location: headache   Primary Care Provider:  Angelena Sole MD  CC:  Itching, Headache, finger pain, and anxiety.  History of Present Illness: 1. Itching:  Pt has been itching all over for a while now.  No new soaps or detergents.         ROS: no skin changes, no rashes  2. Headache: Pt with a hx of migraines.  Hasn't had migraines since she lost her memory in 2003.  The migraines are returning.  She used to take Imitrex which did help        ROS: endorses photophobia, phonophobia  3. Finger pain:  Has been picking at her right middle finger because of her nerves.  It has not gotten any better since last visit.  She continues to pick at it.          ROS: no drainage, redness, loss of motion  4. Anxiety: Still has a lot of anxiety.  It is better with Klonopin       ROS: endorses depressive symptoms  5. Sleep problems:  Still having problems with her sleep.  She doesn't know what she has tried to help with her sleep.  Listens to soothing music.         ROS: denies excessive daytime sleepiness  Habits & Providers  Alcohol-Tobacco-Diet     Tobacco Status: never  Pap Smear  Procedure date:  08/11/2008  Findings:         Current Problems (verified): 1)  Ataxia  (ICD-781.3) 2)  Dizziness  (ICD-780.4) 3)  Depression  (ICD-311) 4)  Anxiety State Nos  (ICD-300.00) 5)  Disorder, Adjustment W/depressed Mood  (ICD-309.0) 6)  Migraine, Unspec., w/o Intractable Migraine  (ICD-346.90) 7)  Memory Loss  (ICD-310.1) 8)  Hypothyroidism, Unspecified   (ICD-244.9) 9)  Hypertension, Benign Systemic  (ICD-401.1) 10)  Hyperlipidemia  (ICD-272.4) 11)  Djd, Unspecified  (ICD-715.90) 12)  Anemia, Other, Unspecified  (ICD-285.9)  Current Medications (verified): 1)  Hydrochlorothiazide 25 Mg Tabs (Hydrochlorothiazide) .... Take 1 Tablet By Mouth Once A Day 2)  Imitrex 100 Mg Tabs (Sumatriptan Succinate) .... Take 1 Tablet By Mouth As Directed 3)  Klonopin 0.5 Mg Tabs (Clonazepam) .... Take 1 Tablet By Mouth Three Times A Day As Needed 4)  Lexapro 20 Mg Tabs (Escitalopram Oxalate) .... Take 1 Tablet By Mouth Once A Day 5)  Seroquel 50 Mg  Tabs (Quetiapine Fumarate) .... Take 1/2 Tablet For 3 Days At Bedtime For Sleep and Mood, Then Increase To 1 Tablet At Bedtime For 4 Days, If Still No Improvement Take 2 Tablets At Edtime. 6)  Synthroid 125 Mcg Tabs (Levothyroxine Sodium) .Marland Kitchen.. 1 Tablet By Mouth Daily For Hypothyroidism  Allergies: No Known Drug Allergies  Past History:  Past Medical History: severe short term memory loss since 4/03 CT 2006 negative for acute process  MRI brain in 2006 :showed mild atropy and sm vessel disease,  no acute stroke. no focal abnormalities in temporal lobes.  no abnormal enhancement. MRI brain 2010 unchanged Normal EEG 2006-2010 Neurologist: Dr. [facility]. 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 no tob/etoh/drugs. 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 Married- Aiyannah Fayad, now disabled due to memory loss  Physical Exam  General:  VS Reviewed. Well appearing, NAD.    Head:  normocephalic, atraumatic.   Eyes:  EOMI PERRLA Limited fundoscopic exam- no obvious abnormalities Mouth:  pharynx pink and moist.  dentures Neck:  supple, full ROM, no goiter or mass  Lungs:   Normal respiratory effort, chest expands symmetrically. Lungs are clear to auscultation, no crackles or wheezes. Heart:  Normal rate and regular rhythm. S1 and S2 normal without gallop, murmur, click, rub or other extra sounds. Abdomen:  Soft, NT, ND, no HSM, active BS  Msk:  right middle finger distal phalanx is not tender to palpation.  The skin and nail have been picked back.  No active bleeding or discharge.  No redness / warmth. Extremities:  no edema Neurologic:  alert & oriented  X3, cranial nerves II-XII intact, strength normal in all extremities, sensation intact to light touch, sensation intact to pinprick, and DTRs symmetrical and normal.  normal gait    Skin:  no lesions, normal color Psych:  full affect   Impression & Recommendations:  Problem # 1:  SLEEP DISORDER (ICD-780.50) Assessment Unchanged  Unsure of cause.  She's not sure if she has been taking the Seroquel or not.  Recommended Tylenol PM  Orders: [facility]- Est  Level 4 (16109)  Problem # 2:  PRURITUS (ICD-698.9) Assessment: New  Reviewed CMET from last office visit and it was normal.  No signs of biliary causes.  Recommended anti-histamine.  Orders: [facility]- Est  Level 4 (60454)  Problem # 3:  ANXIETY STATE NOS (ICD-300.00) Assessment: Unchanged  Continue Klonopin and lexapro Her updated medication list for this problem includes:    Klonopin 0.5 Mg Tabs (Clonazepam) .Marland Kitchen... Take 1 tablet by mouth three times a day as needed    Lexapro 20 Mg Tabs (Escitalopram oxalate) .Marland Kitchen... Take 1 tablet by mouth once a day  Orders: [facility]- Est  Level 4 (09811)  Problem # 4:  INJURY, FINGER (ICD-959.5) Assessment: Unchanged  Continues to not heal because she continues to pick at it.  Does not appear infected.  It is not tender.  Orders: [facility]- Est  Level 4 (99214)  Problem # 5:  MEMORY LOSS (ICD-310.1) Assessment: Unchanged  She seems to be able to remember a little bit more compared to last encounter  Orders: [facility]- Est   Level 4 (99214)  Problem # 6:  MIGRAINE, UNSPEC., W/O INTRACTABLE MIGRAINE (ICD-346.90) Assessment: New  Having migraines again.  MRI from last month was normal.  Will refill Imitrex. Her updated medication list for this problem includes:    Imitrex 100 Mg Tabs (Sumatriptan succinate) .Marland Kitchen... Take 1 tablet by mouth as directed  Orders: [facility]- Est  Level 4 (91478)  Complete Medication List: 1)  Hydrochlorothiazide 25 Mg Tabs (Hydrochlorothiazide) .... Take 1 tablet by mouth once a day 2)  Imitrex 100 Mg Tabs (Sumatriptan succinate) .... Take 1 tablet by mouth as directed 3)  Klonopin 0.5 Mg Tabs (Clonazepam) .... Take 1 tablet by mouth three times a day  as needed 4)  Lexapro 20 Mg Tabs (Escitalopram oxalate) .... Take 1 tablet by mouth once a day 5)  Seroquel 50 Mg Tabs (Quetiapine fumarate) .... Take 1/2 tablet for 3 days at bedtime for sleep and mood, then increase to 1 tablet at bedtime for 4 days, if still no improvement take 2 tablets at edtime. 6)  Synthroid 125 Mcg Tabs (Levothyroxine sodium) .Marland Kitchen.. 1 tablet by mouth daily for hypothyroidism  Other Orders: Colonoscopy (Colon) Mammogram (Screening) (Mammo)  Patient Instructions: 1)  I am going to send in those prescriptions for you 2)  You can take Tylenol PM to help you with your sleep and your itching. 3)  You need to stop picking at your finger for it to get better 4)  We are going to set you up for a colonoscopy and mammogram. 5)  Please schedule an appt in 1 month for a pap smear Prescriptions: KLONOPIN 0.5 MG TABS (CLONAZEPAM) Take 1 tablet by mouth three times a day as needed  #90 x 0   Entered and Authorized by:   Angelena Sole MD   Signed by:   Angelena Sole MD on 01/05/2010   Method used:   Print then Give to Patient   RxID:   [id number] SYNTHROID 125 MCG TABS (LEVOTHYROXINE SODIUM) 1 tablet by mouth daily for hypothyroidism  #30 x 6   Entered and Authorized by:   Angelena Sole MD   Signed by:   Angelena Sole MD on 01/05/2010   Method used:   Electronically to        [id number] ([facility]       [address]       [place], [place]  [address]       Ph: [phone]       Fax: [phone]   RxID:   [id number] LEXAPRO 20 MG TABS (ESCITALOPRAM OXALATE) Take 1 tablet by mouth once a day  #30 x 6   Entered and Authorized by:   Angelena Sole MD   Signed by:   Angelena Sole MD on 01/05/2010   Method used:   Electronically to        [id number] ([facility]       [address]       [place], [place]  [address]       Ph: [phone]       Fax: [phone]   RxID:   [id number] IMITREX 100 MG TABS (SUMATRIPTAN SUCCINATE) Take 1 tablet by mouth as directed  #30 x 0   Entered and Authorized by:   Angelena Sole MD   Signed by:   Angelena Sole MD on 01/05/2010   Method used:   Electronically to        [id number]       [address]       [place], [place]  [address]       Ph: [phone]       Fax: [phone]   RxID:   [id number] HYDROCHLOROTHIAZIDE 25 MG TABS (HYDROCHLOROTHIAZIDE) Take 1 tablet by mouth once a day  #30 x 6   Entered and Authorized by:   Angelena Sole MD   Signed by:   Angelena Sole MD on 01/05/2010   Method used:   [facility] to  [id number]       [address]       [place], [place]  [address]       Ph: [phone]       Fax: [phone]   RxID:   [id number]   Prevention & Chronic Care Immunizations   Influenza vaccine: Not documented    Tetanus booster: 08/31/2005: Done.   Tetanus booster due: 09/01/2015    Pneumococcal vaccine: Not documented  Colorectal Screening   Hemoccult: Done.  (08/31/2005)   Hemoccult due: 08/31/2006    Colonoscopy: Not documented   Colonoscopy action/deferral: GI Referral  (01/05/2010)  Other Screening   Pap smear: Done.   (11/27/2000)   Pap smear due: 11/27/2001    Mammogram: Done.  (11/26/2002)   Mammogram action/deferral: Ordered  (01/05/2010)   Mammogram due: 11/27/2003   Smoking status: never  (01/05/2010)  Lipids   Total Cholesterol: 320  (06/03/2009)   LDL: 238  (06/03/2009)   LDL Direct: Not documented   HDL: 69  (06/03/2009)   Triglycerides: 63  (06/03/2009)    SGOT (AST): 18  (12/15/2009)   SGPT (ALT): 13  (12/15/2009)   Alkaline phosphatase: 73  (12/15/2009)   Total bilirubin: 0.6  (12/15/2009)    Lipid flowsheet reviewed?: Yes   Progress toward LDL goal: Unchanged  Hypertension   Last Blood Pressure: 128 / 82  (01/05/2010)   Serum creatinine: 0.74  (12/15/2009)   Serum potassium 3.6  (12/15/2009)    Hypertension flowsheet reviewed?: Yes   Progress toward BP goal: At goal  Self-Management Support :   Personal Goals (by the next clinic visit) :      Personal blood pressure goal: 130/80  (01/05/2010)     Personal LDL goal: 130  (01/05/2010)    Hypertension self-management support: Not documented    Lipid self-management support: Not documented    Nursing Instructions: Screening colonoscopy ordered Schedule screening mammogram (see order)

## 2010-12-26 NOTE — Progress Notes (Signed)
Summary: Rx Req  Phone Note Refill Request Call back at Home Phone [phone] Message from:  Patient  Refills Requested: Medication #1:  KLONOPIN 0.5 MG TABS Take 1 tablet by mouth three times a day PT TOOK LAST PILL THIS MORNING.    Initial call taken by: Clydell Hakim,  May 19, 2009 12:03 PM  Follow-up for Phone Call        will send message to preceptor, but if he thinks we need to page MD will do.. Follow-up by: Theresia Lo RN,  May 19, 2009 1:48 PM    New/Updated Medications: KLONOPIN 0.5 MG TABS (CLONAZEPAM) Take 1 tablet by mouth three times a day as needed He was supposed to come back in March Ok to refill for limited supply Needs appointment before more thanks Pearlean Brownie MD  May 19, 2009 1:57 PM   Prescriptions: KLONOPIN 0.5 MG TABS (CLONAZEPAM) Take 1 tablet by mouth three times a day as needed  #20 x 0   Entered and Authorized by:   Pearlean Brownie MD   Signed by:   Pearlean Brownie MD on 05/19/2009   Method used:   Telephoned to ...       [facility] [address] [id number]       [address]       [place], [place]  [address]       Ph: [phone]       Fax: [phone]   RxID:   [id number]  patient notified of above message and Rx called to [facility] [address] Theresia Lo RN  May 19, 2009 3:05 PM

## 2010-12-26 NOTE — Progress Notes (Signed)
Summary: Pharmacy med error  Phone Note From Pharmacy   Summary of Call: Recieved call from pharmacist at [facility], they had been mistakenly giving patient her old dose of synthroid ( ). And wanted to let MD know, told thm to start current dose of 125 mcg and we would call patient to come in for  ov. FYI to MD Initial call taken by: Garen Grams LPN,  July 12, 2010 2:03 PM

## 2010-12-26 NOTE — Assessment & Plan Note (Signed)
Summary: cough for months/ls   Vital Signs:  Patient Profile:   60 Years Old Female Height:     63 inches Weight:      153.44 pounds Temp:     98.6 degrees F Pulse rate:   80 / minute BP sitting:   113 / 68  Pt. in pain?   no  Vitals Entered By: Dennison Nancy RN (March 25, 2007 3:49 PM)                Chief Complaint:  Cough for past several months and difficulty sleeping.  History of Present Illness:  Cough      This is a 60 year old woman who presents with Cough.  The patient reports productive cough and wheezing, but denies fever.  Associated symtpoms include acid reflux symptoms.  The patient denies the following symptoms: cold/URI symptoms, sore throat, and nasal congestion.  The cough is worse with lying down.  Ineffective prior treatments have included PPI.  Risk factors include history of asthma and history of reflux.  This has been going on for 4-6 months.  Better with albuterol    Past Medical History:    Reviewed history from 01/23/2007 and no changes required:       ?lichen sclerosis--rx w/steroids 12/09/00, condyloma acuminatum 078.11, h/o abnormal pap, h/o ANA + 1:80. H/o arthritis--currently ok, h/o hematuria, Rt forearm lipoma, severe short term memory loss since 4/03    Risk Factors:  Tobacco use:  never   Review of Systems       See HPI   Physical Exam  General:     Well-developed,well-nourished,in no acute distress; alert,appropriate and cooperative throughout examination Head:     Normocephalic and atraumatic without obvious abnormalities. No apparent alopecia or balding. Eyes:     No corneal or conjunctival inflammation noted. EOMI. Perrla. Funduscopic exam benign, without hemorrhages, exudates or papilledema. Vision grossly normal. Ears:     External ear exam shows no significant lesions or deformities.  Otoscopic examination reveals clear canals, tympanic membranes are intact bilaterally without bulging, retraction, inflammation or  discharge. Hearing is grossly normal bilaterally. Nose:     External nasal examination shows no deformity or inflammation. Nasal mucosa are pink and moist without lesions or exudates. Mouth:     Oral mucosa and oropharynx without lesions or exudates.  Teeth in good repair. Neck:     No deformities, masses, or tenderness noted. Lungs:     Normal respiratory effort, chest expands symmetrically. Lungs are clear to auscultation, no crackles or wheezes. Heart:     Normal rate and regular rhythm. S1 and S2 normal without gallop, murmur, click, rub or other extra sounds. Skin:     Intact without suspicious lesions or rashes Psych:     Very anxious     Impression & Recommendations:  Problem # 1:  ASTHMA, COUGH VARIANT (ICD-493.82) Assessment: New I think this is likely related to asthma, but it could be seasonal allergies or reflux.  She is already on antireflux medication (prilosec 20mg ).  The history doesn't sound like seasonal allergies.  We will start flovent one puff two times a day in hopes of decreasing the frequency of her attacks.  If this therapy is unsuccessful, consider increasing PPI or starting flonase vs further workup (CXR etc).  She was also given a prescription for a spacer for her inhalers. Orders: [facility]- Est Level  3 (25956)

## 2010-12-26 NOTE — Progress Notes (Signed)
Summary: Triage  Phone Note Call from Patient Call back at Home Phone [phone]   Summary of Call: Pt needs to be seen tomorrow for fever and chills. Initial call taken by: Haydee Salter,  March 24, 2008 4:49 PM  Follow-up for Phone Call        started fever Tuesday pm. went up to 103 now is 100. tylenol q4 hrs. chills. no other symptoms per husband. appt made for tomorrow. told him to use urgent care if worse before appt time & to continue tylenol & encourage fluids Follow-up by: Golden Circle RN,  March 24, 2008 4:53 PM

## 2010-12-26 NOTE — Progress Notes (Signed)
Summary: refill  Phone Note Refill Request Call back at Home Phone [phone] Message from:  Patient  Refills Requested: Medication #1:  KLONOPIN 0.5 MG TABS Take 1 tablet by mouth three times a day as needed Initial call taken by: De Nurse,  June 28, 2010 9:27 AM

## 2010-12-26 NOTE — Progress Notes (Signed)
Summary: Rx request and called in to pharmacy/TS  Phone Note Call from Patient Call back at Home Phone [phone]   Summary of Call: Pt called [facility] on [address] 2 weeks ago for  a refill on bp medication, needs asap Initial call taken by: Haydee Salter,  June 18, 2008 10:31 AM  Follow-up for Phone Call        called pt. pt reports that she has been out of norvasc and has been on it for a long time. do not see in meds list. called [facility] [address] and per pharmacist pt has been on it for a year by dr. Efraim Kaufmann love. ok'd refill NORVASC 5 MG ONE DAILY # 30 WITH 3 REFILLS. called the pt and informed of the refill. advised her to sched. appt with her new pcp to go over all her meds. pt agreed. fwd. to Dr.Saunders  Follow-up by: Arlyss Repress CMA,,  June 18, 2008 11:11 AM

## 2010-12-26 NOTE — Assessment & Plan Note (Signed)
Summary: inf finger, thigh   Vital Signs:  Patient profile:   60 year old female Height:      63 inches Weight:      148.7 pounds BMI:     26.44 Temp:     98.3 degrees F oral Pulse rate:   90 / minute BP sitting:   132 / 75  (left arm) Cuff size:   regular  Vitals Entered By: Garen Grams LPN (September 13, 2009 1:38 PM) CC: infected finger and skin on thigh, anxiety, depression, memory loss Is Patient Diabetic? No Pain Assessment Patient in pain? no        Primary Care Provider:  MELISSA LOVE  MD  CC:  infected finger and skin on thigh, anxiety, depression, and memory loss.  History of Present Illness: 1. Infected finger:  Pt. has been picking at the skin around her finger nail on her third finger on her right hand.  She kept picking at it until it bleeds.  It then became red, swollen, and tender.  It has been like this for the past couple of weeks.  She has been using antiseptic solution and she thinks that overall it is getting better.  2. Left thigh infection:  She was trying on some clothes at the [employer] store and a couple days later she noticed a small red bump on the upper part of her thigh.  It got progressively bigger.  It also developed vesicles and was draining.  She keeps it bandaged and used the same antiseptic solution on it.  It too is getting better but is still warm, red, and tender.  3. Memory Loss:  Unchanged.  She still has severe short term memory loss.  We tried to send her to Neurology at last appt. but we where not able to make an appt. since she owed them a fee.  She doesn't think that it would be worth going back to see them since it has been since 2003 since she's been having this problem.  4. Anxiety:  Is having frequent panic attacks.  She takes Lexapro scheduled and Klonopin as needed.  She had 7 attacks today.  She used to go to a Psychiatrist but hasn't been to one in a long time.  She is not interested in going back to see one.  5. Depression:  Is  very depressed.  Denies SI.  Takes the Lexapro as scheduled.  Not interested in going to see a Psychiatrist.  Only feels comfortable in her room.  Husband is not very understanding.  Habits & Providers  Alcohol-Tobacco-Diet     Tobacco Status: never  Current Problems (verified): 1)  Seizure Disorder  (ICD-780.39) 2)  Unspecified Visual Disturbance  (ICD-368.9) 3)  Screening For Malignant Neoplasm of The Cervix  (ICD-V76.2) 4)  Special Screening For Malignant Neoplasms Colon  (ICD-V76.51) 5)  Anxiety State Nos  (ICD-300.00) 6)  Disorder, Adjustment W/depressed Mood  (ICD-309.0) 7)  Chest Wall Pain, Anterior  (ICD-786.52) 8)  Asthma, Cough Variant  (ICD-493.82) 9)  Migraine, Unspec., w/o Intractable Migraine  (ICD-346.90) 10)  Memory Loss  (ICD-310.1) 11)  Hypothyroidism, Unspecified  (ICD-244.9) 12)  Hypertension, Benign Systemic  (ICD-401.1) 13)  Hyperlipidemia  (ICD-272.4) 14)  Djd, Unspecified  (ICD-715.90) 15)  Anemia, Other, Unspecified  (ICD-285.9)  Current Medications (verified): 1)  Albuterol 90 Mcg/act Aers (Albuterol) .... Inhale 1-2 Puff As Directed Every Four To Six Hours 2)  Hydrochlorothiazide 25 Mg Tabs (Hydrochlorothiazide) .... Take 1 Tablet By Mouth Once A  Day 3)  Imitrex 100 Mg Tabs (Sumatriptan Succinate) .... Take 1 Tablet By Mouth As Directed 4)  Klonopin 0.5 Mg Tabs (Clonazepam) .... Take 1 Tablet By Mouth Three Times A Day As Needed 5)  Lexapro 20 Mg Tabs (Escitalopram Oxalate) .... Take 1 Tablet By Mouth Once A Day 6)  Seroquel 50 Mg  Tabs (Quetiapine Fumarate) .... Take 1/2 Tablet For 3 Days At Bedtime For Sleep and Mood, Then Increase To 1 Tablet At Bedtime For 4 Days, If Still No Improvement Take 2 Tablets At Edtime. 7)  Doxycycline Hyclate 100 Mg Tabs (Doxycycline Hyclate) .... Take 1 Tab By Mouth Twice A Day  Allergies: No Known Drug Allergies  Past History:  Past Medical History: severe short term memory loss since 4/03 CT 2006 negative for acute  process  MRI brain in 2006 :showed mild atropy and sm vessel disease, no acute stroke. no focal abnormalities in temporal lobes.  no abnormal enhancement. MRI brain 2010 unchanged Normal EEG 2006-2010 Neurologist: [facility]. last seen 09/13/2005- states has pseudoseizure vs seizure vs manifestation of pyschiatric disorder and mild static organic brain syndrome   Social History: Reviewed history from 09/03/2007 and no changes required. no tob/etoh/drugs. 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 Married- Kadasia Kassing, now disabled due to memory loss  Review of Systems  The patient denies fever, weight loss, and headaches.    Physical Exam  General:  alert.  no acute distress Head:  Normocephalic and atraumatic without obvious abnormalities.  Eyes:  EOMI Lungs:  normal respiratory effort and normal breath sounds.   Heart:  normal rate, regular rhythm, and no murmur.   Msk:  Left middle finger swollen, red and tender distally.  No focal fluid collection.  Able to flex finger fully across all joints.  Neurologic:  alert & oriented X3, cranial nerves II-XII intact, and strength normal in all extremities.   Skin:  Left thigh with a 8x10cm of cellulitis.  It is red, warm, and tender.  No focal areas of abscess.   Psych:  normally interactive.  tearful at times during encounter.  Not anxious appearing.   Impression & Recommendations:  Problem # 1:  CELLULITIS, FINGER (ICD-681.00) Assessment New  Treat with Doxy.  No signs of abscess to drain. Her updated medication list for this problem includes:    Doxycycline Hyclate 100 Mg Tabs (Doxycycline hyclate) .Marland Kitchen... Take 1 tab by mouth twice a day  Orders: [facility]- Est  Level 4 (62130)  Problem # 2:  CELLULITIS, THIGH, LEFT (ICD-682.6) Assessment: New  Treat with Doxy.  No signs of abscess to drain. Her updated medication list for this problem includes:    Doxycycline Hyclate 100 Mg Tabs  (Doxycycline hyclate) .Marland Kitchen... Take 1 tab by mouth twice a day  Orders: [facility]- Est  Level 4 (86578)  Problem # 3:  MEMORY LOSS (ICD-310.1) Assessment: Unchanged  Doesn't want to go back to Neurology.  Neuropsychiatry referral should be considered at some point.  Orders: [facility]- Est  Level 4 ([address]214)  Problem # 4:  ANXIETY STATE NOS (ICD-300.00) Assessment: Unchanged  Doesn't want Psychiatry referral.  Her updated medication list for this problem includes:    Klonopin 0.5 Mg Tabs (Clonazepam) .Marland Kitchen... Take 1 tablet by mouth three times a day as needed    Lexapro 20 Mg Tabs (Escitalopram oxalate) .Marland Kitchen... Take 1 tablet by mouth once a day  Orders: [facility]- Est  Level 4 (46962)  Problem # 5:  DEPRESSION (ICD-311) Assessment: Unchanged  Doesn't want Psychiatry referral.  Would increase Lexapro if unchanged at next visit. Her updated medication list for this problem includes:    Klonopin 0.5 Mg Tabs (Clonazepam) .Marland Kitchen... Take 1 tablet by mouth three times a day as needed    Lexapro 20 Mg Tabs (Escitalopram oxalate) .Marland Kitchen... Take 1 tablet by mouth once a day  Orders: [facility]- Est  Level 4 ([address])  Complete Medication List: 1)  Albuterol 90 Mcg/act Aers (Albuterol) .... Inhale 1-2 puff as directed every four to six hours 2)  Hydrochlorothiazide 25 Mg Tabs (Hydrochlorothiazide) .... Take 1 tablet by mouth once a day 3)  Imitrex 100 Mg Tabs (Sumatriptan succinate) .... Take 1 tablet by mouth as directed 4)  Klonopin 0.5 Mg Tabs (Clonazepam) .... Take 1 tablet by mouth three times a day as needed 5)  Lexapro 20 Mg Tabs (Escitalopram oxalate) .... Take 1 tablet by mouth once a day 6)  Seroquel 50 Mg Tabs (Quetiapine fumarate) .... Take 1/2 tablet for 3 days at bedtime for sleep and mood, then increase to 1 tablet at bedtime for 4 days, if still no improvement take 2 tablets at edtime. 7)  Doxycycline Hyclate 100 Mg Tabs (Doxycycline hyclate) .... Take 1 tab by mouth twice a day  Patient Instructions: 1)  You  have a skin infection of the finger and thigh. 2)  Cellulitis is an infection of the skin by bacteria. 3)  Your antibiotic is Doxycycline.  Take it for 7 days. 4)  Take the entire course of the antibiotic. 5)  If the area continues to spread rapidly despite the antibiotic, call our office or go to the ED. 6)  Make an appointment to return to the office in 1 week for a recheck. 7)    Prescriptions: DOXYCYCLINE HYCLATE 100 MG TABS (DOXYCYCLINE HYCLATE) take 1 tab by mouth twice a day  #14 x 0   Entered and Authorized by:   Angelena Sole MD   Signed by:   Angelena Sole MD on 09/13/2009   Method used:   Print then Give to Patient   RxID:   [id number]

## 2010-12-26 NOTE — Miscellaneous (Signed)
Summary: pharm refills  Clinical Lists Changes  Medications: Rx of ZOCOR 80 MG TABS (SIMVASTATIN) Take 1/2 tablet by mouth every night;  #30 x 11;  Signed;  Entered by: Wilhemina Bonito  MD;  Authorized by: Wilhemina Bonito  MD;  Method used: Electronic    Prescriptions: ZOCOR 80 MG TABS (SIMVASTATIN) Take 1/2 tablet by mouth every night  #30 x 11   Entered and Authorized by:   Wilhemina Bonito  MD   Signed by:   Wilhemina Bonito  MD on 07/22/2007   Method used:   Electronically sent to ...       [facility] [address]       [address]       [place], [place]  [address]       Ph:        Fax:    RxID:   [id number]  will fax request for klonopin 0.5mg  tablets.  #100 use 1 tablet by mouth three times a day as needed for anxiety, may take 2 tablets at bedtime for sleep or anxiety.  max 4 per day.   3 refills.

## 2010-12-26 NOTE — Assessment & Plan Note (Signed)
Summary: HFU, df   Vital Signs:  Patient Profile:   60 Years Old Female Height:     63 inches Weight:      152 pounds Pulse rate:   66 / minute BP sitting:   122 / 76  (left arm)  Pt. in pain?   no  Vitals Entered By: Arlyss Repress CMA, (January 13, 2009 1:54 PM)              Is Patient Diabetic? No     PCP:  Wilhemina Bonito  MD  Chief Complaint:  hospital f/up for seizures.  History of Present Illness: 1. Seizures:  History provided by pt. and daughter. Pt. has been having seizures for the past couple of months now, maybe longer.  Her husband does not tell anyone if she is having them or not.  She was recently hospitalized for seizures 2 weeks ago because her sister witnessed them.  She was evaluated by Neurology in the hospital and had a negative MRI and EEG.  There plan was to do a 24 hour EEG on pt. at follow up visit.  Since she was discharged she doesn't think that she has had any seizures.  She has a hx. of short term memory loss.    2. Anxiety attacks:  Also has episodes where she can't hold her saliva.  Neurology is aware and they think that they might be mini seizures.  Pt is on anti-depressant (Lexapro).  She doesn't think that she has had any anxiety attacks since discharge.    Current Allergies: No known allergies   Past Medical History:    Reviewed history from 01/01/2009 and no changes required:       ?lichen sclerosis--rx w/steroids 12/09/00,        condyloma acuminatum 078.11,        h/o abnormal pap, h/o ANA + 1:80.        H/o arthritis--currently ok,        h/o hematuria,        Rt forearm lipoma,        severe short term memory loss since 4/03       CT 2006 negative for acute process        MRI brain in 2006 :showed mild atropy and sm vessel disease, no acute stroke. no focal abnormalities in temporal lobes.  no abnormal enhancement.       MRI brain 2010 unchanged       Normal EEG 2006-2010       Neurologist: [facility]. last seen 09/13/2005- states has pseudoseizure vs seizure vs manifestation of pyschiatric disorder and mild static organic brain syndrome    Family History:    Reviewed history from 01/23/2007 and no changes required:       grandfather w/prostate Ca.  No other cancers.  No DM., siblings CAD, sister-CAD, deceased MI at age 17.  Social History:    Reviewed history from 09/03/2007 and no changes required:       no tob/etoh/drugs. 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 Married- Annalisia Ingber, now disabled due to memory loss     Physical Exam  General:     alert.  no acute distress Head:     Normocephalic and atraumatic without obvious abnormalities.  Eyes:     EOMI Mouth:     moist mucous membranes Lungs:     normal respiratory effort and normal breath sounds.   Heart:  normal rate, regular rhythm, and no murmur.   Abdomen:     soft and non-tender.   Extremities:     no lower extremity edema Neurologic:     alert & oriented X3, cranial nerves II-XII intact, and strength normal in all extremities.   Psych:     flat affect.      Impression & Recommendations:  Problem # 1:  SEIZURE DISORDER (ICD-780.39) Assessment: New Neurology did not make any changes to medications.  Plan to follow up with [facility] for 24 hour EEG will see them after that visit. Her updated medication list for this problem includes:    Klonopin 0.5 Mg Tabs (Clonazepam) .Marland Kitchen... Take 1 tablet by mouth three times a day  Orders: [facility]- Est Level  3 (95284)   Problem # 2:  ANXIETY STATE NOS (ICD-300.00) Assessment: Unchanged No changes at this time Her updated medication list for this problem includes:    Klonopin 0.5 Mg Tabs (Clonazepam) .Marland Kitchen... Take 1 tablet by mouth three times a day    Lexapro 20 Mg Tabs (Escitalopram oxalate) .Marland Kitchen... Take 1 tablet by mouth once a day  Orders: [facility]- Est Level  3 ([address]244)   Complete Medication List: 1)  Albuterol 90 Mcg/act Aers (Albuterol)  .... Inhale 1-2 puff as directed every four to six hours 2)  Hydrochlorothiazide 25 Mg Tabs (Hydrochlorothiazide) .... Take 1 tablet by mouth once a day 3)  Imitrex 100 Mg Tabs (Sumatriptan succinate) .... Take 1 tablet by mouth as directed 4)  Klonopin 0.5 Mg Tabs (Clonazepam) .... Take 1 tablet by mouth three times a day 5)  Lexapro 20 Mg Tabs (Escitalopram oxalate) .... Take 1 tablet by mouth once a day 6)  Synthroid 125 Mcg Tabs (Levothyroxine sodium) .... Take 1 tablet by mouth once a day 7)  Zocor 80 Mg Tabs (Simvastatin) .... Take 1/2 tablet by mouth every night 8)  Seroquel 50 Mg Tabs (Quetiapine fumarate) .... Take 1/2 tablet for 3 days at bedtime for sleep and mood, then increase to 1 tablet at bedtime for 4 days, if still no improvement take 2 tablets at edtime.   Patient Instructions: 1)  Please schedule a follow-up appointment in 1 month.

## 2010-12-26 NOTE — Assessment & Plan Note (Signed)
Summary: fever x 2 days  w/no other s/s   Vital Signs:  Patient Profile:   60 Years Old Female Height:     63 inches Weight:      167 pounds Temp:     99.6 degrees F Pulse rate:   78 / minute BP sitting:   116 / 70  Pt. in pain?   no  Vitals Entered By: Jone Baseman CMA (March 25, 2008 2:41 PM)                  PCP:  Wilhemina Bonito  MD  Chief Complaint:  fever x 3 days.  History of Present Illness: Pt is a 60 yo female with history of anxiety, panic attacks and profound short-term memory loss per her report who presents with fevers and chills since Monday. Pt become tearful and anxious when describing her situation with memory loss. Frequently consults a notebook full of personal notes to help herself with providing history. Husband is out in the lobby.  1. Fever Pt reports fever since monday with a high of 102 last night. States that she frequently has sweats and chills with these fevers. Has taken tylenol with little relief. States that her anxiety and panic attacks are worse with these symptoms. Also c/o dry mouth for which she has been drinking more water. Pt denies sore throat. Denies runny nose. Some vomiting and diarrhea a few days ago but none currently. Endorses some headache with these symptoms but says they are not typical of her migraines. Denies any rashes. Denies cough. Denies any urinary symptoms (frequency or urgency specifically). Does not think that she has been around any sick contacts.  Pt states that she feels quite a bit better today and came at the request of her husband. She would have preferred to not come to the doctor today since symptoms were improving.    Current Allergies: No known allergies      Review of Systems       The patient complains of fever.  The patient denies anorexia.         as per HPI otherwise   Physical Exam  General:     Tearful at times.alert, well-developed, well-nourished, and well-hydrated.   Head:  normocephalic and atraumatic.   Eyes:     Conjunctivae pink, sclearae clear. EOMI Ears:     External ear exam shows no significant lesions or deformities.  Otoscopic examination reveals clear canals, tympanic membranes are intact bilaterally without bulging, retraction, inflammation or discharge. Hearing is grossly normal bilaterally. Mouth:     Oral mucosa and oropharynx without lesions or exudates.  Teeth in good repair. Neck:     No deformities, masses, or tenderness noted. Lungs:     Normal respiratory effort, chest expands symmetrically. Lungs are clear to auscultation, no crackles or wheezes. Heart:     Normal rate and regular rhythm. S1 and S2 normal without gallop, murmur, click, rub or other extra sounds. Abdomen:     Bowel sounds positive,abdomen soft and non-tender without masses, organomegaly or hernias noted. No CVA tenderness. Pulses:     2+ DP and radial pulses Skin:     Intact without suspicious lesions or rashes Cervical Nodes:     no cervical or clavicular nodes. Psych:     Tearful. Answers questions appropriately but freqently has to consult notebook for answers to questions pertaining to history beyond today's events. Mildly anxious appearing. Full affect. Thought process and content are normal, linear, goal-oriented.  Impression & Recommendations:  Problem # 1:  FEVER UNSPECIFIED (ICD-780.60) Assessment: New Temp is 99.6. HR 78. Exam benign without lymphadenopathy. Pt tearful and somewhat anxious. Reassured pt that I did not suspect bacterial process. Given recent improvement in symptoms, this represents a likely viral process that should self-resolve. Advised plenty of fluids and addition of ibuprofen to tylenol to help treat fevers. Pt in agreement with this a seems to feel better with reassurance. Advised routin follow-up with Dr. Sandria Manly in 1-2 months and in the next few days if symptoms progress. Orders: [facility]- Est Level  3 ([address])   Complete Medication  List: 1)  Albuterol 90 Mcg/act Aers (Albuterol) .... Inhale 1-2 puff as directed every four to six hours 2)  Hydrochlorothiazide 25 Mg Tabs (Hydrochlorothiazide) .... Take 1 tablet by mouth once a day 3)  Imitrex 100 Mg Tabs (Sumatriptan succinate) .... Take 1 tablet by mouth as directed 4)  Klonopin 0.5 Mg Tabs (Clonazepam) .... Take 1 tablet by mouth three times a day 5)  Lexapro 20 Mg Tabs (Escitalopram oxalate) .... Take 1 tablet by mouth once a day 6)  Synthroid 125 Mcg Tabs (Levothyroxine sodium) .... Take 1 tablet by mouth once a day 7)  Zocor 80 Mg Tabs (Simvastatin) .... Take 1/2 tablet by mouth every night 8)  Seroquel 50 Mg Tabs (Quetiapine fumarate) .... Take 1/2 tablet for 3 days at bedtime for sleep and mood, then increase to 1 tablet at bedtime for 4 days, if still no improvement take 2 tablets at edtime.   Patient Instructions: 1)  Take ibuprofen over the counter - take 2-3 tablets every 6 hours.  2)  Take tylenol according to the bottle directions. 3)  Controlling your symptoms is most important. 4)  This is probably a virus and should resolve within 7-10 days. 5)  If you're not feeling better or worsen acutely in the next several days, come back in to be seen. 6)  Schedule a routine follow-up appointment with Dr. Sandria Manly in the next 1-2 months. 7)  Make sure you drink plenty of fluids to keep from getting dehydrated. 8)  Nice to meet you today.    ]

## 2010-12-26 NOTE — Assessment & Plan Note (Signed)
Summary: meet new md wp   Vital Signs:  Patient Profile:   60 Years Old Female Height:     63 inches Weight:      159.2 pounds Pulse rate:   65 / minute BP sitting:   124 / 82  (left arm)  Pt. in pain?   no  Vitals Entered By: Theresia Lo RN (June 30, 2008 1:34 PM)              Is Patient Diabetic? No      PCP:  MELISSA LOVE  MD  Chief Complaint:  meet new doctor, review meds, and routine check up.  History of Present Illness: Pt. is a 60 yo female who comes into clinic today for med check, preventive care, and a complaint of sensitivity to sunlight.  Pt. has had this sensitivity to sunlight for many months now.  It is only to sunlight not to any artificial lights.  Pt. describes it as a burning type pain.  Pt. also complained of redness and irritation of the eye but that has now resolved.  Her symptoms are greatly improved with the use of sunglasses.  Pt. has an appointment with an "eye doctor" and she was encouraged to keep that appointment.   Med review:  This is the first time meeting this patient.  Her medications appear appropriate given her list of problems.  Pt. does not see any need to make any adjustments to the medications.  I will give her prescriptions for those that need refilled.  Prevention: Will get lipid panel for screening.  A CMET because of her hypertension and current medications.  Also encouraged pt. to get a colonoscopy and mammogram which she agreed to.  Also performed pap smear.    Current Allergies: No known allergies   Past Medical History:    Reviewed history from 09/03/2007 and no changes required:       ?lichen sclerosis--rx w/steroids 12/09/00,        condyloma acuminatum 078.11,        h/o abnormal pap, h/o ANA + 1:80.        H/o arthritis--currently ok,        h/o hematuria,        Rt forearm lipoma,        severe short term memory loss since 4/03       MRI brain in 2006 :showed mild atropy and sm vessel disease, no acute stroke.  no focal abnormalities in temporal lobes.  no abnormal enhancement.       Neurologist: Dr. [facility]. last seen 09/13/2005- states has pseudoseizure vs seizure vs manifestation of pyschiatric disorder and mild static organic brain syndrome    Family History:    Reviewed history from 01/23/2007 and no changes required:       grandfather w/prostate Ca.  No other cancers.  No DM., siblings CAD, sister-CAD, deceased MI at age 39.     Physical Exam  General:     alert, well-hydrated, appropriate dress, normal appearance, and cooperative to examination.   Head:     normocephalic and atraumatic.   Eyes:     vision grossly intact, pupils equal, pupils round, pupils reactive to light, corneas and lenses clear, no optic disk abnormalities, no retinal abnormalitiies, and no nystagmus.   Ears:     R ear normal and L ear normal.   Nose:     no external deformity.   Lungs:     normal respiratory  effort and normal breath sounds.   Heart:     normal rate, regular rhythm, no murmur, and no gallop.   Abdomen:     soft, non-tender, normal bowel sounds, no distention, no masses, and no guarding.   Pulses:     R and L dorsalis pedis and posterior tibial pulses are full and equal bilaterally Neurologic:     cranial nerves II-XII intact.    Pelvic Exam  Vulva:      normal appearance.   Cervix:      normal, no lesions.       Impression & Recommendations:  Problem # 1:  MEMORY LOSS (ICD-310.1) Assessment: Unchanged Appears to be a chronic condition.  Will look over chart to make sure we are not missing anything.  Memory seems to be slightly improving.  Pt. asked about other alternative therapies and recommended to try if she would like but they will most likely not be of benefit. Orders: [facility]- Est Level  3 (99213)   Problem # 2:  SPECIAL SCREENING FOR MALIGNANT NEOPLASMS COLON (ICD-V76.51) Scheduled her for screening colonoscopy for preventive  care Orders: Gastroenterology Referral (GI)   Problem # 3:  SCREENING FOR MALIGNANT NEOPLASM OF THE CERVIX (ICD-V76.2) Pap smear completed will follow up on results Orders: Pap Smear-[facility] ([id number]) [facility]- Est Level  3 (09811)   Problem # 4:  UNSPECIFIED VISUAL DISTURBANCE (ICD-368.9) Pt. complains of increased sensitivity to sunlight.  Neurologically intact.  Fundoscopic exam was within normal limits.  Recommended keeping appointment with the opthalmologist and update me on any results.  Also recommended larg sunglasses to block out more of the sun.  Complete Medication List: 1)  Albuterol 90 Mcg/act Aers (Albuterol) .... Inhale 1-2 puff as directed every four to six hours 2)  Hydrochlorothiazide 25 Mg Tabs (Hydrochlorothiazide) .... Take 1 tablet by mouth once a day 3)  Imitrex 100 Mg Tabs (Sumatriptan succinate) .... Take 1 tablet by mouth as directed 4)  Klonopin 0.5 Mg Tabs (Clonazepam) .... Take 1 tablet by mouth three times a day 5)  Lexapro 20 Mg Tabs (Escitalopram oxalate) .... Take 1 tablet by mouth once a day 6)  Synthroid 125 Mcg Tabs (Levothyroxine sodium) .... Take 1 tablet by mouth once a day 7)  Zocor 80 Mg Tabs (Simvastatin) .... Take 1/2 tablet by mouth every night 8)  Seroquel 50 Mg Tabs (Quetiapine fumarate) .... Take 1/2 tablet for 3 days at bedtime for sleep and mood, then increase to 1 tablet at bedtime for 4 days, if still no improvement take 2 tablets at edtime.  Other Orders: Future Orders: Lipid-[facility] ([id number]) ... 06/29/2009 Comp Met-[facility] ([id number]) ... 06/29/2009   Patient Instructions: 1)  Please return to clinic when fasting to have blood drawn for labs.  Please make a follow up appointment for 1 month after your labs to go over result. 2)  Be sure to make an appointment for a mammogram and keep your appointment for the colonoscopy. 3)  Keep your appointment to see the eye doctor and let me know of any results. 4)  I have sent the refill for your  prescriptions to the pharmacy so they should be there for you. 5)  For your records today we did a full physical exam and pap smear.   Prescriptions: LEXAPRO 20 MG TABS (ESCITALOPRAM OXALATE) Take 1 tablet by mouth once a day  #30 x 6   Entered and Authorized by:   Angelena Sole MD   Signed by:   Vickki Muff  Forbes Loll MD on 06/30/2008   Method used:   Faxed to ...       [address]       [address]       [place], [place]  [address]       Ph: [phone]       Fax: [phone]   RxID:   [id number] KLONOPIN 0.5 MG TABS (CLONAZEPAM) Take 1 tablet by mouth three times a day  #90 x 1   Entered and Authorized by:   Angelena Sole MD   Signed by:   Angelena Sole MD on 06/30/2008   Method used:   Faxed to ...       [address]       [address]       [place], [place]  [address]       Ph: [phone]       Fax: [phone]   RxID:   [id number]  ]

## 2010-12-26 NOTE — Progress Notes (Signed)
Summary: Rx Req  Phone Note Refill Request Call back at Home Phone [phone] Message from:  Patient  Refills Requested: Medication #1:  KLONOPIN 0.5 MG TABS Take 1 tablet by mouth three times a day as needed [facility] [address]  Initial call taken by: Clydell Hakim,  June 22, 2010 10:42 AM      Rx is already ready to be picked up when she called last week Angelena Sole MD  June 22, 2010 11:14 AM

## 2010-12-26 NOTE — Progress Notes (Signed)
Summary: triage  Phone Note Call from Patient Call back at Home Phone [phone]   Caller: spouse-Willie Summary of Call: Pt has been very dizzy and off balance. Initial call taken by: Clydell Hakim,  December 15, 2009 10:26 AM  Follow-up for Phone Call        spoke with spouse. started yesterday afternoon. got up to void & had to hold onto walls. it has persisted. also out of Klonopin. appt today as a work in at 11am Follow-up by: Golden Circle RN,  December 15, 2009 10:35 AM

## 2010-12-26 NOTE — Assessment & Plan Note (Signed)
Summary: migraine/ls   Vital Signs:  Patient Profile:   60 Years Old Female Height:     63 inches Weight:      163.1 pounds BMI:     29.00 Temp:     98.8 degrees F Pulse rate:   67 / minute BP sitting:   129 / 86  Pt. in pain?   yes    Location:   head    Intensity:   8  Vitals Entered By: Golden Circle RN (August 13, 2007 10:25 AM)                  Chief Complaint:   R breast pain / MIGRAINE.  History of Present Illness: 60 yo  with hx of migraines c/o:  1- HA. She used to be on imitrex until 2003. Since 2003 ,after her memory loss problem she has not had HA until a week ago. Two HA's in the last wk. Located  behing the eyes ( bil) , 8/10.   +  photophobia,  nausea.  Improved in a dark room and some sleep. Denies sonophobia, vomiting, fever, neck pain, motor / sensory deficits.   There is no visual changes . Her Tunel vision has improved. She drinks  six cups of coffee  (decaf) , diet sodas x3.No tobacco.   2- R breast pain: 1 wk hx of R sided chest pain, located bellow the R breast in an area of  ~ 6 cm , achy in character. Pain is > on ther R costosternal area. Denies trauma. She has been lifting and moving furnitures around for the last week . Pt is Right handed. Pain increased with palpation , R arm movement and deep breath.  It is relieved with ibuprofen and rest.  Denies palpitations, SOB, sweating, nausea. Pt does not have hx of CAD. + FHX of CAD.     Current Allergies: No known allergies       Physical Exam  General:     Well-developed,well-nourished,in no acute distress; alert,appropriate and cooperative throughout examination. Some memory problems ( husband is in the room helping with questions) Chest Wall:     costochondrial tenderness  > chest wall tenderness  at rib 8. No erythema/ edema/ calor/ rash Lungs:     Normal respiratory effort, chest expands symmetrically. Lungs are clear to auscultation, no crackles or wheezes. Heart:     Normal rate  and regular rhythm. S1 and S2 normal without gallop, murmur, click, rub or other extra sounds. Neurologic:     alert & oriented X3, cranial nerves II-XII intact, strength normal in all extremities, sensation intact to light touch, and gait normal.   Skin:     Intact without suspicious lesions or rashes Psych:     Oriented X3.      Impression & Recommendations:  Problem # 1:  MIGRAINE, UNSPEC., W/O INTRACTABLE MIGRAINE (ICD-346.90) Pt was migraine free for  ~ 5 years. Now she has had two episodes in a week. HA's are relived by ibuprofen / rest. Pt is to write a HA diary and f/u with pcp in 1 month.  After HA's diary is reviewed , medication will be chosen based on migraine frequency ( preventive vs acute attack treatment) .  Pt did well in the past with Imitrex.  Her updated medication list for this problem includes:    Imitrex 100 Mg Tabs (Sumatriptan succinate) .Marland Kitchen... Take 1 tablet by mouth as directed  Orders: [facility]- Est Level  3 ([address]109)  Problem # 2:  CHEST WALL PAIN, ANTERIOR (ICD-786.52) Despite pt's strong FHX of CAD, this pt presentation indicates clearly a chest wall pain . Most likely costochondrits. Also consider small tear pectoral muscle. Ibuprofen , heat pad ( pt states that pain improves with it). F/u in 2 wks with pcp.  Orders: [facility]- Est Level  3 (16109)   Complete Medication List: 1)  Albuterol 90 Mcg/act Aers (Albuterol) .... Inhale 1-2 puff as directed every four to six hours 2)  Hydrochlorothiazide 25 Mg Tabs (Hydrochlorothiazide) .... Take 1 tablet by mouth once a day 3)  Imitrex 100 Mg Tabs (Sumatriptan succinate) .... Take 1 tablet by mouth as directed 4)  Klonopin 0.5 Mg Tabs (Clonazepam) .... Take 1 tablet by mouth three times a day 5)  Lexapro 20 Mg Tabs (Escitalopram oxalate) .... Take 1 tablet by mouth once a day 6)  Synthroid 125 Mcg Tabs (Levothyroxine sodium) .... Take 1 tablet by mouth once a day 7)  Zocor 80 Mg Tabs (Simvastatin) .... Take 1/2 tablet  by mouth every night   Patient Instructions: 1)  Diary of the headache 2)  Ibuprofen ( 200 mg) 3 pills every 6 hours or 4 pills three times a day. 3)  Use heating pad. 4)  Avoid lifting weight or move furnitures x 1 wk. 5)  F/u in 2 weeks with Dr. Sandria Manly    ]

## 2010-12-26 NOTE — Miscellaneous (Signed)
  Clinical Lists Changes  Problems: Removed problem of SCREENING FOR MALIGNANT NEOPLASM OF THE CERVIX (ICD-V76.2) Removed problem of INJURY, FINGER (ICD-959.5) Removed problem of PRURITUS (ICD-698.9) Removed problem of ATAXIA (ICD-781.3) Removed problem of DIZZINESS (ICD-780.4) Removed problem of DISORDER, ADJUSTMENT W/DEPRESSED MOOD (ICD-309.0)

## 2010-12-26 NOTE — Assessment & Plan Note (Signed)
Summary: tb test/Desmon Hitchner/el  Nurse Visit       PPD Application    Vaccine Type: PPD    Site: left forearm    Mfr: Sanofi Pasteur    Dose: 0.1 ml    Route: ID    Given by: Altamese Dilling CMA,    Exp. Date: 01/23/2009    Lot #: [id number]  PPD Results    Date of reading: 04/17/2007    Results: < 5mm    Interpretation: negative PPD placed at 10:00 am and Pt. informed to return in 48-72 hours to have site read. ...................................................................Altamese Dilling CMA,  Apr 15, 2007 10:10 AM   Orders Added: 1)  TB Skin Test [phone] 2)  Admin 1st Vaccine [phone]

## 2010-12-26 NOTE — Assessment & Plan Note (Signed)
Summary: ? seizure,tcb   Vital Signs:  Patient profile:   60 year old female Weight:      152.7 pounds BMI:     27.15 Temp:     97.7 degrees F Pulse rate:   78 / minute BP sitting:   145 / 93  Vitals Entered By: Pearlean Brownie MD (February 23, 2010 11:03 AM) CC: seizure Is Patient Diabetic? No Pain Assessment Patient in pain? no        Primary Care Provider:  Angelena Sole MD  CC:  seizure.  History of Present Illness: Seizure? According to patients husband she awakened him from sleep approximately midnight with shaking of all her extremieties foaming at her mouth which lasted approximately 45 seconds. She was confused for another 20-30 minutes then back to her normal baseline.  No incontinence but did bite her R lower lip.  Had been acting normal when went to bed.  Missed about 24 hours of clonazepam when ran out although got it filled this AM and took one then.   No fever or trauma or visual changes or focal weakness or trauma.   Has had seizures before  PMH frequent Panic attacks and memory problems for which she has been worked up by neurology  ROS - as above PMH - Medications reviewed and updated in medication list.  Smoking Status noted in VS form    Habits & Providers  Alcohol-Tobacco-Diet     Tobacco Status: never  Allergies: No Known Drug Allergies  Physical Exam  General:  Well-developed,well-nourished,in no acute distress; alert,appropriate and cooperative throughout examination Writes down suggestions and has a notebook she refers to for past events  Eyes:  No corneal or conjunctival inflammation noted. EOMI. Perrla. Funduscopic exam benign, without hemorrhages, exudates or papilledema. Vision grossly normal. Mouth:  Oral mucosa and oropharynx without lesions or exudates. Small abrsion innner R lower lip Neck:  No deformities, masses, or tenderness noted.  supple Lungs:  Normal respiratory effort, chest expands symmetrically. Lungs are clear to  auscultation, no crackles or wheezes. Heart:  Normal rate and regular rhythm. S1 and S2 normal without gallop, murmur, click, rub or other extra sounds. Extremities:  No clubbing, cyanosis, edema, or deformity noted with normal full range of motion of all joints.   Neurologic:  CN 2-7 Normal.  Upper Extremity strength 5/5.  Able to walk on tip toes and heels.  Romberg normal.  Finger to Nose normal  Skin:  Intact without suspicious lesions or rashes Psych:  Cognition and judgment appear intact. Alert and cooperative with normal attention span and concentration. 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  No signs of infection or mass lesion or cva.   Have her take mediciations regularly and monitor her symptoms  Her updated medication list for this problem includes:    Klonopin 0.5 Mg Tabs (Clonazepam) .Marland Kitchen... Take 1 tablet by mouth three times a day as needed  Orders: [facility]- Est  Level 4 (46962)  Complete Medication List: 1)  Hydrochlorothiazide 25 Mg Tabs (Hydrochlorothiazide) .... Take 1 tablet by mouth once a day 2)  Imitrex 100 Mg Tabs (Sumatriptan succinate) .... Take 1 tablet by mouth as directed 3)  Klonopin 0.5 Mg Tabs (Clonazepam) .... Take 1 tablet by mouth three times a day as needed 4)  Lexapro 20 Mg Tabs (Escitalopram oxalate) .... Take 1 tablet  by mouth once a day 5)  Seroquel 50 Mg Tabs (Quetiapine fumarate) .... Take 1/2 tablet for 3 days at bedtime for sleep and mood, then increase to 1 tablet at bedtime for 4 days, if still no improvement take 2 tablets at edtime. 6)  Synthroid 125 Mcg Tabs (Levothyroxine sodium) .Marland Kitchen.. 1 tablet by mouth daily for hypothyroidism 7)  Hydroxyzine Hcl 25 Mg Tabs (Hydroxyzine hcl) .... Take 1 tab by mouth every 6 hours as needed for itching 8)  Eye Drops Allergy Relief 0.05-0.25 %  Soln (Tetrahydrozoline-zn sulfate) .... Apply 2 drops in each eye every 6 hours as needed for itching / redness dispo: qs for 1 month  Patient Instructions: 1)  Keep your appointment with Dr Lelon Perla 2)  Take your clonazepam regularly call if you are going to run out - give [place] 2-3 days to get it filled 3)  If you have another seizure or more tongue problems or problems speaking or moving an arm or leg or seeing double that happen suddenly you should go to the ER

## 2010-12-26 NOTE — Progress Notes (Signed)
Summary: wi request  Phone Note Call from Patient Call back at Home Phone [phone]   Caller: husband Reason for Call: Talk to Nurse Summary of Call: pt is requesting wi appt for a cough that's she's had for a while Initial call taken by: ERIN LEVAN,  March 24, 2007 1:33 PM  Follow-up for Phone Call        Spoke with pt's husband, he states an appt had already been made for pt for tomorrow.  Appt made by Myrlene Broker, RN. Follow-up by: Jerson Furukawa MARTIN RN,  March 24, 2007 2:59 PM
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
## 2010-12-26 NOTE — Progress Notes (Signed)
Summary: WI request  Phone Note Call from Patient   Caller: Husband Summary of Call: Pt is requesting to be seen today for a migrane.  Pt is coming in at 10:30am this morning. Initial call taken by: Haydee Salter,  August 13, 2007 8:31 AM

## 2010-12-26 NOTE — Assessment & Plan Note (Signed)
Summary: pap/eo   Vital Signs:  Patient profile:   60 year old female Height:      63 inches Weight:      153 pounds BMI:     27.20 BSA:     1.73 Temp:     98.1 degrees F Pulse rate:   81 / minute BP sitting:   122 / 80  Vitals Entered By: Jone Baseman CMA (February 08, 2010 8:59 AM) CC: Pap Is Patient Diabetic? No Pain Assessment Patient in pain? no        Primary Care Provider:  Angelena Sole MD  CC:  Pap.  History of Present Illness: 1. Itching: She has been itching for the past couple of months.  She is not sure if there is anything new that she could be exposed to.  The only relatively new medicine is the Synthroid.  She has tried some over the counter medicines which have helped.  2. Memory Loss:  unchanged.  Vision is improved.  She used to not have any peripheral vision and now she has full visual fields.  3. Preventive care:  due for PAP smear today  Habits & Providers  Alcohol-Tobacco-Diet     Tobacco Status: never  Current Medications (verified): 1)  Hydrochlorothiazide 25 Mg Tabs (Hydrochlorothiazide) .... Take 1 Tablet By Mouth Once A Day 2)  Imitrex 100 Mg Tabs (Sumatriptan Succinate) .... Take 1 Tablet By Mouth As Directed 3)  Klonopin 0.5 Mg Tabs (Clonazepam) .... Take 1 Tablet By Mouth Three Times A Day As Needed 4)  Lexapro 20 Mg Tabs (Escitalopram Oxalate) .... Take 1 Tablet By Mouth Once A Day 5)  Seroquel 50 Mg  Tabs (Quetiapine Fumarate) .... Take 1/2 Tablet For 3 Days At Bedtime For Sleep and Mood, Then Increase To 1 Tablet At Bedtime For 4 Days, If Still No Improvement Take 2 Tablets At Edtime. 6)  Synthroid 125 Mcg Tabs (Levothyroxine Sodium) .Marland Kitchen.. 1 Tablet By Mouth Daily For Hypothyroidism 7)  Hydroxyzine Hcl 25 Mg Tabs (Hydroxyzine Hcl) .... Take 1 Tab By Mouth Every 6 Hours As Needed For Itching 8)  Eye Drops Allergy Relief 0.05-0.25 % Soln (Tetrahydrozoline-Zn Sulfate) .... Apply 2 Drops in Each Eye Every 6 Hours As Needed For Itching /  Redness Dispo: Qs For 1 Month  Allergies: No Known Drug Allergies  Past History:  Past Medical History: Reviewed history from 01/05/2010 and no changes required. severe short term memory loss since 4/03 CT 2006 negative for acute process  MRI brain in 2006 :showed mild atropy and sm vessel disease, no acute stroke. no focal abnormalities in temporal lobes.  no abnormal enhancement. MRI brain 2010 unchanged Normal EEG 2006-2010 Neurologist: [facility]. 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 no tob/etoh/drugs. 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 Well appearing, NAD.    Head:  normocephalic, atraumatic.   Eyes:  EOMI PERRLA Limited fundoscopic exam- no obvious abnormalities Neck:  supple, full ROM, no goiter or mass  Lungs:  Normal respiratory effort, chest expands symmetrically. Lungs are clear to auscultation, no crackles or wheezes. Heart:  Normal rate and regular rhythm. S1 and S2 normal without gallop, murmur, click, rub or other extra sounds. Extremities:  no edema Neurologic:  alert & oriented  X3, cranial nerves II-XII intact, strength normal in all extremities, sensation intact to light touch, sensation intact to pinprick, and DTRs symmetrical and normal.  normal gait    Skin:  excoriations from itching on upper extremities and lower extremities. Psych:  full affect   Impression & Recommendations:  Problem # 1:  PRURITUS (ICD-698.9) Assessment Deteriorated  Unsure of precipitating factor.  No signs of jaundice.  Will add Hydroxyzine.  Orders: [facility] - Est  40-64 yrs ([address])  Problem # 2:  Screening Cervical Cancer  (ICD-V76.2) Assessment: Unchanged PAP smear today  Complete Medication List: 1)  Hydrochlorothiazide 25 Mg Tabs (Hydrochlorothiazide) .... Take 1 tablet by mouth once a day 2)  Imitrex 100 Mg Tabs (Sumatriptan succinate) .... Take 1 tablet by mouth as directed 3)  Klonopin 0.5 Mg Tabs (Clonazepam) .... Take 1 tablet by mouth three times a day as needed 4)  Lexapro 20 Mg Tabs (Escitalopram oxalate) .... Take 1 tablet by mouth once a day 5)  Seroquel 50 Mg Tabs (Quetiapine fumarate) .... Take 1/2 tablet for 3 days at bedtime for sleep and mood, then increase to 1 tablet at bedtime for 4 days, if still no improvement take 2 tablets at edtime. 6)  Synthroid 125 Mcg Tabs (Levothyroxine sodium) .Marland Kitchen.. 1 tablet by mouth daily for hypothyroidism 7)  Hydroxyzine Hcl 25 Mg Tabs (Hydroxyzine hcl) .... Take 1 tab by mouth every 6 hours as needed for itching 8)  Eye Drops Allergy Relief 0.05-0.25 % Soln (Tetrahydrozoline-zn sulfate) .... Apply 2 drops in each eye every 6 hours as needed for itching / redness dispo: qs for 1 month  Other Orders: Pap Smear-[facility] ([id number])  Patient Instructions: 1)  You had a PAP smear done today 2)  We will let you know of the results 3)  For your itching I have prescribed Hydroxyzine and Allergy eye drops. 4)  Please schedule a follow up appointment in 2 months to follow up on your itching Prescriptions: EYE DROPS ALLERGY RELIEF 0.05-0.25 % SOLN (TETRAHYDROZOLINE-ZN SULFATE) Apply 2 drops in each eye every 6 hours as needed for itching / redness Dispo: QS for 1 month  #1 x 3   Entered and Authorized by:   Angelena Sole MD   Signed by:   Angelena Sole MD on 02/08/2010   Method used:   Electronically to        [facility] [id number] (retail)       [address]       [place], [place]  [address]       Ph: [phone]       Fax: [phone]   RxID:   [id number] HYDROXYZINE HCL 25 MG TABS (HYDROXYZINE HCL) Take 1 tab by mouth every 6  hours as needed for itching  #60 x 3   Entered and Authorized by:   Angelena Sole MD   Signed by:   Angelena Sole MD on 02/08/2010   Method used:   Electronically to        [facility]  [address] [phone]* (retail)       [address]       [place], [place]  [address]       Ph: [phone]       Fax: [phone]   RxID:   [id number]

## 2010-12-26 NOTE — Progress Notes (Signed)
Summary: refill  Phone Note Refill Request Call back at Home Phone [phone] Message from:  Patient  Refills Requested: Medication #1:  HYDROCHLOROTHIAZIDE 25 MG TABS Take 1 tablet by mouth once a day  Medication #2:  IMITREX 100 MG TABS Take 1 tablet by mouth as directed [facility]  Initial call taken by: De Nurse,  May 02, 2009 9:03 AM  Follow-up for Phone Call        Refilled Rx.      Prescriptions: IMITREX 100 MG TABS (SUMATRIPTAN SUCCINATE) Take 1 tablet by mouth as directed  #12 x 4   Entered and Authorized by:   Angelena Sole MD   Signed by:   Angelena Sole MD on 05/02/2009   Method used:   Electronically to        [facility] [phone]* (retail)       [address]       [place], [place]  [address]       Ph: [phone]       Fax: [phone]   RxID:   [id number] HYDROCHLOROTHIAZIDE 25 MG TABS (HYDROCHLOROTHIAZIDE) Take 1 tablet by mouth once a day  #30 x 0   Entered by:   Theresia Lo RN   Authorized by:   Angelena Sole MD   Signed by:   Theresia Lo RN on 05/02/2009   Method used:   Electronically to        [facility] [phone]* (retail)       [address]       [place], [place]  [address]       Ph: [phone]       Fax: [phone]   RxID:   [id number]  refilled HCTZ , but will need to ask MD about Imitrex before  sending in. last rx 08/2007.  message left with husband that patient needs follow up appointment. he states she has had problem with headache all weekend. offered appointment but he states she has just gone to sleep. he will tell her to call back if appointment is needed.  Theresia Lo RN  May 02, 2009 9:12 AM

## 2010-12-26 NOTE — Progress Notes (Signed)
Summary: refill  Phone Note Refill Request Call back at Home Phone [phone] Message from:  Patient  Refills Requested: Medication #1:  KLONOPIN 0.5 MG TABS Take 1 tablet by mouth three times a day as needed  Medication #2:  HYDROCHLOROTHIAZIDE 25 MG TABS Take 1 tablet by mouth once a day  Medication #3:  LEXAPRO 20 MG TABS Take 1 tablet by mouth once a day  Medication #4:  SEROQUEL 50 MG  TABS take 1/2 tablet for 3 days at bedtime for sleep and mood did not get the eye drop nor the Hydroxyzine - was called to wrong pharm  Initial call taken by: De Nurse,  February 20, 2010 2:05 PM  Follow-up for Phone Call        Rx sent to pharmacy Klonopin and Seroquel available for pick up Follow-up by: Angelena Sole MD,  February 21, 2010 1:44 PM  Additional Follow-up for Phone Call Additional follow up Details #1::        [facility] sent the rx for eye drops back. needs clarification. placed in pcp chart box.Golden Circle RN  February 22, 2010 9:32 AM  Additional Follow-up by: Golden Circle RN,  February 22, 2010 9:31 AM    Prescriptions: SEROQUEL 50 MG  TABS (QUETIAPINE FUMARATE) take 1/2 tablet for 3 days at bedtime for sleep and mood, then increase to 1 tablet at bedtime for 4 days, if still no improvement take 2 tablets at edtime.  #60 x 3   Entered and Authorized by:   Angelena Sole MD   Signed by:   Angelena Sole MD on 02/21/2010   Method used:   Print then Give to Patient   RxID:   [id number] KLONOPIN 0.5 MG TABS (CLONAZEPAM) Take 1 tablet by mouth three times a day as needed  #90 x 0   Entered and Authorized by:   Angelena Sole MD   Signed by:   Angelena Sole MD on 02/21/2010   Method used:   Print then Give to Patient   RxID:   [id number] EYE DROPS ALLERGY RELIEF 0.05-0.25 % SOLN (TETRAHYDROZOLINE-ZN SULFATE) Apply 2 drops in each eye every 6 hours as needed for itching / redness Dispo: QS for 1 month  #1 x 3   Entered and Authorized by:   Angelena Sole  MD   Signed by:   Angelena Sole MD on 02/21/2010   Method used:   Electronically to        [facility] [phone]* (retail)       [address]       [place], [place]  [address]       Ph: [phone]       Fax: [phone]   RxID:   [id number] HYDROXYZINE HCL 25 MG TABS (HYDROXYZINE HCL) Take 1 tab by mouth every 6 hours as needed for itching  #60 x 3   Entered and Authorized by:   Angelena Sole MD   Signed by:   Angelena Sole MD on 02/21/2010   Method used:   Electronically to        [facility] [phone]* (retail)       [address]       [place], [place]  [address]       Ph: [phone]       Fax: [phone]   RxID:   [id number] LEXAPRO 20 MG TABS (ESCITALOPRAM OXALATE) Take 1 tablet by mouth once a day  #30 x 6   Entered and Authorized by:  Angelena Sole MD   Signed by:   Angelena Sole MD on 02/21/2010   Method used:   Electronically to        [facility] [phone]* (retail)       [address]       [place], [place]  [address]       Ph: [phone]       Fax: [phone]   RxID:   [id number] HYDROCHLOROTHIAZIDE 25 MG TABS (HYDROCHLOROTHIAZIDE) Take 1 tablet by mouth once a day  #30 x 6   Entered and Authorized by:   Angelena Sole MD   Signed by:   Angelena Sole MD on 02/21/2010   Method used:   Electronically to        [facility] [phone]* (retail)       [address]       [place], [place]  [address]       Ph: [phone]       Fax: [phone]   RxID:   [id number]

## 2010-12-26 NOTE — Miscellaneous (Signed)
Summary: appt with neurologist  Clinical Lists Changes pt's husband will sched. appt. if unable to sched. appt he will call us back. ...................................................................Cleto Claggett CMA,  September 04, 2007 5:22 PM

## 2010-12-26 NOTE — Letter (Signed)
Summary: Generic Letter  [facility]  [address]   [place], [place] [address]   Phone: [phone]  Fax: [phone]    02/09/2010  DAMA HEDGEPETH [address] [place], [place]  [address]  Dear Ms. Tellefsen,    Your PAP smear was normal     Sincerely,   Angelena Sole MD  Appended Document: Generic Letter mailed.  Appended Document: Generic Letter    Clinical Lists Changes  Orders: Added new Test order of [facility]- Est  Level 4 ([address]) - Signed

## 2010-12-26 NOTE — Assessment & Plan Note (Signed)
Summary: atypical new neurological symptoms   Vital Signs:  Patient profile:   60 year old female Height:      63 inches Weight:      152 pounds BMI:     27.02 BSA:     1.72 Temp:     97.9 degrees F Pulse rate:   69 / minute Pulse (ortho):   74 / minute BP sitting:   109 / 72 BP standing:   101 / 70  Vitals Entered By: Jone Baseman CMA (December 15, 2009 11:28 AM)  Serial Vital Signs/Assessments:  Time      Position  BP       Pulse  Resp  Temp     By 11:29 AM  Lying LA  119/78   66                    Jessica Fleeger CMA 11:29 AM  Sitting   112/76   69                    Jessica Fleeger CMA 11:29 AM  Standing  101/70   74                    Jessica Fleeger CMA  CC: dizzy, loss of bowel/bladder control and weak x 1 day Pain Assessment Patient in pain? no        Primary Care Provider:  Angelena Sole MD  CC:  dizzy and loss of bowel/bladder control and weak x 1 day.  History of Present Illness: 60yo F w/ chronic amnesia here w/ new unsteady gait, dizziness, incontinence  New neurologic symptoms: Husband and pt states that she was in her nl state of health yesterday morning and around 11am became dizziness, unsteady walking, and experienced mild degree of bowel/bladder incontinence. Dizziness is more feeling unsteady than true vertigo.  Denies the room spinning.   Denies any fevers, head trauma, focal weakness, difficulty with speech, or numbness.  No CP, palpitations, vision changes.  Has intermittent migraines but not currently experiencing migraines.  No new medications.  Able to eat and sleep since initial symptoms.    Habits & Providers  Alcohol-Tobacco-Diet     Tobacco Status: never  Allergies: No Known Drug Allergies  Review of Systems        Denies any fevers, head trauma, focal weakness, difficulty with speech, or numbness.  No CP, palpitations, vision changes.    Physical Exam  General:  VS Reviewed. Well appearing, NAD.  Unable to ambulate with  assistance  Head:  atraumatic.   Eyes:  EOMI PERRLA Limited fundoscopic exam- no obvious abnormalities Mouth:  no lesions Neck:  supple, full ROM, no goiter or mass  Lungs:  Normal respiratory effort, chest expands symmetrically. Lungs are clear to auscultation, no crackles or wheezes. Heart:  Normal rate and regular rhythm. S1 and S2 normal without gallop, murmur, click, rub or other extra sounds. Abdomen:  Soft, NT, ND, no HSM, active BS  Msk:  no deformities Extremities:  no edema Neurologic:  alert & oriented to person and place (unable to completely recall information)alert & oriented X3, cranial nerves II-XII intact, strength normal in all extremities, sensation intact to light touch, sensation intact to pinprick, and DTRs symmetrical and normal. Atypical gait- pt unsteady but able to catch herself    Skin:  no lesions Psych:  strange affect and demeanor inappropriate comments at times pt would sometimes alter her history   Impression &  Recommendations:  Problem # 1:  DIZZINESS (ICD-780.4) Assessment New Unclear of etiology at this time.  Does not appear to be infectious or positional or vertigo.  Given the Adam's triad of symptoms, concerning for possible NPH.  Plan to obtain MRI of the head w/ and w/o contrast to evaluate for enlarged ventricles, brain lesion or mass.  Previous MRI on 12/2008 was nl.  Previous EEG's have been nl.   No acute symptoms suggestive of stroke.  No focal deficits on exam. Also concern for possible conversion d/o given history and atypical exam. Orthostatics negative. Will check for HIV, RPR, CBC, TSH, and CMET. She is to f/u with Dr. Lelon Perla next week after she obtains her MRI. Pt d/w and examined by Dr. Mauricio Po.   Orders: MRI with & without Contrast (MRI w&w/o Contrast) Comp Met-[facility] (16109-60454) CBC w/Diff-[facility] (09811) TSH-[facility] (91478-29562) RPR-[facility] (13086-57846) HIV-[facility] (96295-28413) Urinalysis-[facility] (00000) [facility]- Est  Level 4  (24401)  Problem # 2:  HYPOTHYROIDISM, UNSPECIFIED (ICD-244.9) Assessment: Unchanged  Quite possible that abnl thyroid could contribute to some of her symptoms.  Not currently on any medications.  Will check a TSH today and treat appropriately.  Orders: [facility]- Est  Level 4 (02725)  Problem # 3:  ANXIETY STATE NOS (ICD-300.00) Assessment: Comment Only  Pt admitted that her anxiety state will sometimes manifest as physical symptoms.  Denies being anxious at this time, but quite possible that her anxiety is contributing to acute symptoms.  No changes to current meds.  Her updated medication list for this problem includes:    Klonopin 0.5 Mg Tabs (Clonazepam) .Marland Kitchen... Take 1 tablet by mouth three times a day as needed    Lexapro 20 Mg Tabs (Escitalopram oxalate) .Marland Kitchen... Take 1 tablet by mouth once a day  Orders: [facility]- Est  Level 4 (36644)  Complete Medication List: 1)  Hydrochlorothiazide 25 Mg Tabs (Hydrochlorothiazide) .... Take 1 tablet by mouth once a day 2)  Imitrex 100 Mg Tabs (Sumatriptan succinate) .... Take 1 tablet by mouth as directed 3)  Klonopin 0.5 Mg Tabs (Clonazepam) .... Take 1 tablet by mouth three times a day as needed 4)  Lexapro 20 Mg Tabs (Escitalopram oxalate) .... Take 1 tablet by mouth once a day 5)  Seroquel 50 Mg Tabs (Quetiapine fumarate) .... Take 1/2 tablet for 3 days at bedtime for sleep and mood, then increase to 1 tablet at bedtime for 4 days, if still no improvement take 2 tablets at edtime.  Patient Instructions: 1)  Follow up with Dr. Lelon Perla next week after her MRI on Monday. 2)  If you develop any signs or symptoms of a stroke, please go to the Emergency Room to be evaluated. 3)  If your symptoms worsen, please call us or go to the emergency room to be evaluated. 4)  We are checking blood work and setting you up for a MRI.  Appended Document: urine report Reviewed.......Marland KitchenMarisue Ivan, MD   Lab Visit  Laboratory Results   Urine  Tests  Date/Time Received: December 15, 2009 12:30 PM  Date/Time Reported: December 15, 2009 1:52 PM   Routine Urinalysis   Color: yellow Appearance: Clear Glucose: negative   (Normal Range: Negative) Bilirubin: negative   (Normal Range: Negative) Ketone: negative   (Normal Range: Negative) Spec. Gravity: 1.015   (Normal Range: 1.003-1.035) Blood: negative   (Normal Range: Negative) pH: 7.0   (Normal Range: 5.0-8.0) Protein: trace   (Normal Range: Negative) Urobilinogen: 1.0   (Normal Range: 0-1) Nitrite: negative   (  Normal Range: Negative) Leukocyte Esterace: negative   (Normal Range: Negative)    Comments: ...............test performed by......Marland KitchenBonnie A. [place], MLS (ASCP)cm    Orders Today:

## 2010-12-26 NOTE — Progress Notes (Signed)
Summary: Triage  Phone Note Call from Patient Call back at Home Phone [phone]   Reason for Call: Talk to Nurse Summary of Call: Pt wants to be seen today for memory problems.  She states it has been happening a lot lately and is very concerned. Initial call taken by: Haydee Salter,  Mar 30, 2008 10:59 AM  Follow-up for Phone Call        wanted to be seen today. no appt available. appt with her pcp tomorrow 1:30. she forgot the appt during the time we talked. I asked her to write it down. stated she was & husband was there & listening Follow-up by: Golden Circle RN,  Mar 30, 2008 11:01 AM  Additional Follow-up for Phone Call Additional follow up Details #1::        pt has chronic memory problems of this nature and severity.  this is not new.  pt gets very frustrated.  pt is on medications.  will be happy to see tomorrow, but husband must be present.  Additional Follow-up by: Wilhemina Bonito  MD,  Mar 30, 2008 12:05 PM         Appended Document: Triage spoke with husband. memory problem is "a little worse" than it has been, but otherwise it is the same problem she has been seen for. Told him that the md suggested she be seen by neurologist for this, rather than come back in here. He agreed & will call the neuro md..

## 2010-12-26 NOTE — Consult Note (Signed)
Summary: [facility]  [facility]   Imported By: Knox Royalty 08/13/2008 13:21:10  _____________________________________________________________________  External Attachment:    Type:   Image     Comment:   External Document

## 2011-03-01 ENCOUNTER — Encounter: Payer: Self-pay | Admitting: Family Medicine

## 2011-03-01 ENCOUNTER — Ambulatory Visit (INDEPENDENT_AMBULATORY_CARE_PROVIDER_SITE_OTHER): Payer: Medicare Other | Admitting: Family Medicine

## 2011-03-01 VITALS — BP 133/74 | HR 87 | Temp 98.1°F | Wt 156.9 lb

## 2011-03-01 DIAGNOSIS — E039 Hypothyroidism, unspecified: Secondary | ICD-10-CM

## 2011-03-01 DIAGNOSIS — F070 Personality change due to known physiological condition: Secondary | ICD-10-CM

## 2011-03-01 DIAGNOSIS — F411 Generalized anxiety disorder: Secondary | ICD-10-CM

## 2011-03-01 LAB — TSH: TSH: 0.01 u[IU]/mL — ABNORMAL LOW (ref 0.350–4.500)

## 2011-03-01 MED ORDER — CLONAZEPAM 0.5 MG PO TABS: 0.5000 mg | ORAL_TABLET | Freq: Three times a day (TID) | ORAL | Status: DC | PRN

## 2011-03-01 MED ORDER — LEVOTHYROXINE SODIUM 125 MCG PO TABS: 125.0000 ug | ORAL_TABLET | Freq: Every day | ORAL | Status: DC

## 2011-03-01 MED ORDER — ESCITALOPRAM OXALATE 20 MG PO TABS: 20.0000 mg | ORAL_TABLET | Freq: Every day | ORAL | Status: DC

## 2011-03-01 MED ORDER — HYDROCHLOROTHIAZIDE 25 MG PO TABS: 25.0000 mg | ORAL_TABLET | Freq: Every day | ORAL | Status: DC

## 2011-03-01 MED ORDER — QUETIAPINE FUMARATE 50 MG PO TABS: 50.0000 mg | ORAL_TABLET | Freq: Every day | ORAL | Status: DC

## 2011-03-01 MED ORDER — SUMATRIPTAN SUCCINATE 100 MG PO TABS: 100.0000 mg | ORAL_TABLET | ORAL | Status: DC

## 2011-03-01 NOTE — Assessment & Plan Note (Signed)
Unchanged.  Stable with current regimen

## 2011-03-01 NOTE — Progress Notes (Signed)
  Subjective:    Patient ID: [name], female    DOB: June 19, 1951, 60 y.o.   MRN: [id number]  HPI 1. Memory Loss:  Remains unchanged.  Started in 2003.  She still cannot remember anything long term.  She does remember my face but that is about it.  Has to take notes on everything to get through the day  2. Hypothyroidism:  Taking the Synthroid as directed.  3. Anxiety:  Controlled with the xanax and Lexapro   Review of Systems Denies headaches, vision changes, numbness / weakness.  Denies constipation, fatigue.  Denies SI.    Objective:   Physical Exam  Constitutional: She appears well-nourished. No distress.  Eyes: Conjunctivae and EOM are normal.  Neck: Normal range of motion. Neck supple. No tracheal deviation present. No thyromegaly present.       Thyroid surgery scar  Cardiovascular: Normal rate, regular rhythm and normal heart sounds.   No murmur heard. Pulmonary/Chest: Effort normal and breath sounds normal. No respiratory distress.  Abdominal: Soft. Bowel sounds are normal. She exhibits no distension.  Musculoskeletal: Normal range of motion. She exhibits no edema.  Skin: Skin is warm and dry. No rash noted.  Psychiatric:       Unchanged.            Assessment & Plan:

## 2011-03-01 NOTE — Assessment & Plan Note (Signed)
Unchanged.  Advised her to continue to challenge herself.  Will work on optimizing thyroid to see if that makes a difference.

## 2011-03-01 NOTE — Assessment & Plan Note (Signed)
Last TSH elevated.  Will recheck today.  If remains elevated will titrate up the dose.

## 2011-03-01 NOTE — Patient Instructions (Signed)
It was good to see you again today I have refilled all of your medications I am going to check your thyroid today Please schedule a follow up appointment in 1 month

## 2011-03-13 LAB — CBC
HCT: 33.9 % — ABNORMAL LOW (ref 36.0–46.0)
HCT: 34.5 % — ABNORMAL LOW (ref 36.0–46.0)
Hemoglobin: 11.5 g/dL — ABNORMAL LOW (ref 12.0–15.0)
Hemoglobin: 11.8 g/dL — ABNORMAL LOW (ref 12.0–15.0)
MCHC: 33.9 g/dL (ref 30.0–36.0)
MCHC: 34.2 g/dL (ref 30.0–36.0)
MCV: 97.3 fL (ref 78.0–100.0)
MCV: 98.9 fL (ref 78.0–100.0)
Platelets: 216 10*3/uL (ref 150–400)
Platelets: 243 10*3/uL (ref 150–400)
RBC: 3.43 MIL/uL — ABNORMAL LOW (ref 3.87–5.11)
RBC: 3.55 MIL/uL — ABNORMAL LOW (ref 3.87–5.11)
RDW: 14.3 % (ref 11.5–15.5)
RDW: 14.5 % (ref 11.5–15.5)
WBC: 4.2 10*3/uL (ref 4.0–10.5)
WBC: 7.7 10*3/uL (ref 4.0–10.5)

## 2011-03-13 LAB — COMPREHENSIVE METABOLIC PANEL
ALT: 20 U/L (ref 0–35)
AST: 33 U/L (ref 0–37)
Albumin: 3.6 g/dL (ref 3.5–5.2)
Alkaline Phosphatase: 51 U/L (ref 39–117)
BUN: 6 mg/dL (ref 6–23)
CO2: 27 mEq/L (ref 19–32)
Calcium: 8.3 mg/dL — ABNORMAL LOW (ref 8.4–10.5)
Chloride: 98 mEq/L (ref 96–112)
Creatinine, Ser: 0.74 mg/dL (ref 0.4–1.2)
GFR calc Af Amer: >60 mL/min (ref >60)
GFR calc non Af Amer: >60 mL/min (ref >60)
Glucose, Bld: 99 mg/dL (ref 70–99)
Potassium: 2.8 mEq/L — ABNORMAL LOW (ref 3.5–5.1)
Sodium: 133 mEq/L — ABNORMAL LOW (ref 135–145)
Total Bilirubin: 1.2 mg/dL (ref 0.3–1.2)
Total Protein: 6.8 g/dL (ref 6.0–8.3)

## 2011-03-13 LAB — URINE MICROSCOPIC-ADD ON

## 2011-03-13 LAB — BASIC METABOLIC PANEL
BUN: 5 mg/dL — ABNORMAL LOW (ref 6–23)
BUN: 7 mg/dL (ref 6–23)
BUN: 9 mg/dL (ref 6–23)
CO2: 26 mEq/L (ref 19–32)
CO2: 27 mEq/L (ref 19–32)
CO2: 28 mEq/L (ref 19–32)
Calcium: 9 mg/dL (ref 8.4–10.5)
Calcium: 9 mg/dL (ref 8.4–10.5)
Calcium: 9.5 mg/dL (ref 8.4–10.5)
Chloride: 102 mEq/L (ref 96–112)
Chloride: 105 mEq/L (ref 96–112)
Chloride: 99 mEq/L (ref 96–112)
Creatinine, Ser: 0.65 mg/dL (ref 0.4–1.2)
Creatinine, Ser: 0.69 mg/dL (ref 0.4–1.2)
Creatinine, Ser: 0.83 mg/dL (ref 0.4–1.2)
GFR calc Af Amer: >60 mL/min (ref >60)
GFR calc Af Amer: >60 mL/min (ref >60)
GFR calc Af Amer: >60 mL/min (ref >60)
GFR calc non Af Amer: >60 mL/min (ref >60)
GFR calc non Af Amer: >60 mL/min (ref >60)
GFR calc non Af Amer: >60 mL/min (ref >60)
Glucose, Bld: 117 mg/dL — ABNORMAL HIGH (ref 70–99)
Glucose, Bld: 143 mg/dL — ABNORMAL HIGH (ref 70–99)
Glucose, Bld: 97 mg/dL (ref 70–99)
Potassium: 3.1 mEq/L — ABNORMAL LOW (ref 3.5–5.1)
Potassium: 4.2 mEq/L (ref 3.5–5.1)
Potassium: 4.5 mEq/L (ref 3.5–5.1)
Sodium: 136 mEq/L (ref 135–145)
Sodium: 137 mEq/L (ref 135–145)
Sodium: 137 mEq/L (ref 135–145)

## 2011-03-13 LAB — GLUCOSE, CAPILLARY
Comment 1: 707
Glucose-Capillary: 166 mg/dL — ABNORMAL HIGH (ref 70–99)

## 2011-03-13 LAB — URINALYSIS, ROUTINE W REFLEX MICROSCOPIC
Bilirubin Urine: NEGATIVE
Glucose, UA: NEGATIVE mg/dL
Hgb urine dipstick: NEGATIVE
Ketones, ur: NEGATIVE mg/dL
Nitrite: NEGATIVE
Protein, ur: NEGATIVE mg/dL
Specific Gravity, Urine: 1.012 (ref 1.005–1.030)
Urobilinogen, UA: 0.2 mg/dL (ref 0.0–1.0)
pH: 6 (ref 5.0–8.0)

## 2011-03-13 LAB — RAPID URINE DRUG SCREEN, HOSP PERFORMED
Amphetamines: NOT DETECTED
Barbiturates: NOT DETECTED
Benzodiazepines: POSITIVE — AB
Cocaine: NOT DETECTED
Opiates: NOT DETECTED
Tetrahydrocannabinol: NOT DETECTED

## 2011-03-13 LAB — TSH: TSH: 1.329 u[IU]/mL (ref 0.350–4.500)

## 2011-04-05 ENCOUNTER — Encounter: Payer: Self-pay | Admitting: Family Medicine

## 2011-04-10 NOTE — Procedures (Signed)
EEG NUMBER:  [id number]   The patient is a female currently in room 3010.   TECHNICIAN:  Harriett Sine.   PHYSICIAN:  Dr. Roseanne Reno.   This is an awake and drowsy EEG, 16-channel recording with one channel  representing heart rate and rhythm exclusively for this 60 year old  female who is described as awake and drowsy by the technician and was  exposed to hyperventilation and photic stimulation maneuvers.   MEDICATIONS:  Hydrochlorothiazide, Zocor, Klonopin, heparin, Tylenol,  and Ventolin.   The patient is admitted for seizure and level of consciousness changes.  She supposedly experienced 5 generalized tonic-clonic seizures all  witnessed at home.  The patient had been on clonazepam for anxiety and  Seroquel and had run out of medication several days prior to the  seizure, so a withdrawal reaction is also in the differential.  She has  a history of hypertension, depression, anxiety, and subjective memory  deficits, a question of early dementia with a pseudodementia was also  raised.   A posterior dominant background rhythm is difficult to establish in this  EEG, which shows beta fast activity as well as alpha intrusion, as a  side effect of benzodiazepine use.  With eye opening and eye closure,  there is almost no difference in the posterior frequencies.  It appears  that 9 Hz is the main posterior dominant rhythm.  The EKG remains on  normal sinus rhythm at 72 beats per minute plus or minus 3.  The patient  responded to photic stimulation with photic entrainment from 5 Hz onward  but does not show epileptiform activity as a result.  Hyperventilation  was performed and amplitude buildup is noted.  However, no epileptiform  activity is seen here either.  The patient was described as alert and  awake and drowsy but not asleep during the recording.   CONCLUSION:  This is a normal EEG for the patient's age and conscious  state except for the beta fast activity, which is the medication side  effect and not strictly a brain wave abnormality.      Melvyn Novas, M.D.  Electronically Signed     [id number]  D:  01/03/2009 18:07:34  T:  01/04/2009 02:59:07  Job #:  [id number]

## 2011-04-10 NOTE — Consult Note (Signed)
[name], [name] NO.:  [id number]   MEDICAL RECORD NO.:  [id number]          PATIENT TYPE:  INP   LOCATION:  3010                         FACILITY:  [facility]   PHYSICIAN:  Noel Christmas, MD    DATE OF BIRTH:  1951/03/02   DATE OF CONSULTATION:  01/01/2009  DATE OF DISCHARGE:                                 CONSULTATION   REFERRING PHYSICIAN:  Pearlean Brownie, MD   REASON FOR CONSULTATION:  Recurrent grand mal seizures.   HISTORY OF PRESENT ILLNESS:  This is a 60 year old African American lady  who was brought to the emergency room after experiencing about 5  generalized tonic-clonic seizures, which were witnessed at home.  Seizures began on the previous evening and occurred during the night and  early this morning.  EMS was called eventually and the patient was  brought to the emergency room.  Family members gave a history of the  patient having ran out of her clonazepam and Seroquel about 3 days prior  to the onset of seizure activity.  She apparently has been on these  medications for anxiety.  She has an equivocal history of partial  seizure disorder versus anxiety attacks.  She has had normal EEG as well  as MRI studies several years ago.  She has not been on any  anticonvulsants medications.  She has had no further seizures since  arriving here.  She was given Ativan 2 mg IV.  CT scan of her head  showed no acute intracranial abnormality.  The patient has a history of  confusion and memory loss, particularly short-term memory difficulty.  Memory problems began acutely with a febrile illness about 7-8 years  ago.  No etiology was ever determined.  There has been no progression of  loss of mental faculties since the onset.   PAST MEDICAL HISTORY:  Remarkable for hypertension, hyperlipidemia,  depression and anxiety, and memory difficulty.   CURRENT MEDICATIONS:  1. Clonazepam 0.5 mg (frequency unclear).  2. Simvastatin 80 mg per day.  3. Seroquel  50 mg with (frequency clear).  4. Lexapro 20 mg per day.  5. Amlodipine 5 mg per day.  6. Hydrochlorothiazide 25 mg per day.   FAMILY HISTORY:  Noncontributory.   PHYSICAL EXAMINATION:  GENERAL:  Appearance is that of middle-aged lady  of medium build who was lethargic, but fairly easy to arouse.  She was  disoriented to time as well as place.  She was cooperative and in no  acute distress.  HEENT:  Pupils were equal and reactive normally to light.  Extraocular  movements were full and conjugate.  Visual fields were intact and  normal.  There was no facial weakness.  Hearing was normal.  Speech and  palatal movement were normal.  MUSCULOSKELETAL:  Strength and muscle tone were normal throughout.  Coordination was normal.  Deep tendon reflexes were normal and  symmetrical including high-grade reflexes.  Plantar responses were  flexor.  Sensory examination was normal.  Carotid auscultation was  normal.   CLINICAL IMPRESSION:  1. Recurrent generalized tonic-clonic seizures most likely secondary  to withdrawal from clonazepam and possibly Seroquel as well.  2. Cannot rule out primary generalized seizure disorder at this point.  3. Recurrent spells of panic attacks.  It is unclear as well whether      these spells may be manifestations of partial seizure activity as      well.   RECOMMENDATIONS:  1. MRI of the brain without and with contrast.  2. EEG, routine, to rule out possible seizure disorder.  If routine      EEG is unremarkable, I recommend a 24-hour prolonged EEG recording      particularly with a history of frequent panic attacks.  3. Resume clonazepam and Seroquel as per usual doses.  4. No anticonvulsant medication recommended unless the patient has      recurrent seizures on routine medications or EEG indicates      underlying seizure disorder.   Thank you for asking me to evaluate [name].      Noel Christmas, MD  Electronically Signed     CS/MEDQ   D:  01/01/2009  T:  01/02/2009  Job:  [id number]

## 2011-04-13 NOTE — Discharge Summary (Signed)
[name], [name] NO.:  [id number]   MEDICAL RECORD NO.:  [id number]          PATIENT TYPE:  INP   LOCATION:  3010                         FACILITY:  [facility]   PHYSICIAN:  Pearlean Brownie, M.D.DATE OF BIRTH:  1951/10/12   DATE OF ADMISSION:  01/01/2009  DATE OF DISCHARGE:  01/03/2009                               DISCHARGE SUMMARY   DISCHARGE ATTENDING:  Pearlean Brownie, MD.   PRIMARY CARE PHYSICIAN:  Angelena Sole, MD, [facility].   DISCHARGE DIAGNOSES:  1. Question of seizure disorder.  2. Short-term memory loss.  3. Hypothyroidism.  4. Anxiety.  5. Depression.  6. Hypertension.  7. Hyperlipidemia.   DISCHARGE MEDICATIONS:  1. Hydrochlorothiazide 25 mg p.o. daily.  2. Zocor 80 mg p.o. nightly.  3. Klonopin 0.5 mg p.o. t.i.d.  4. Tylenol 325 mg 2 tablets p.o. q.4 h. p.r.n. pain.  5. Lexapro 20 mg p.o. daily.  6. Seroquel 50 mg p.o. nightly.  7. Albuterol 1-2 puffs q.4 h. p.r.n. wheezing and dyspnea.   DISCONTINUE MEDICATIONS:  1. Amlodipine.  2. Imitrex.   CONSULTS:  Neurology, Dr. Noel Christmas of [facility].   LABORATORY DATA:  On admission, urine drug screen was positive for  benzodiazepines, otherwise negative.  Electrolytes within normal limits  except for potassium of 3.1, TSH within normal limits at 1.329.  Electrolytes within normal limits on discharge with potassium of 4.5  after supplementation.   STUDIES:  1. MRI of brain with and without contrast on January 02, 2009, showed      normal exam.  2. EEG January 03, 2009, showed normal EEG activity except for      medication side effects beta fast activity.   BRIEF SYNOPSIS:  This is 60 year old female with past medical history  significant for short-term memory loss, questionable seizure disorder,  hypertension, anxiety, and depression who presented following 5 episodes  of generalized tonic-clonic seizure like activity on day of  admission.  1. Question of seizure activity. Differential diagnosis based on      patient history with benzodiazepine withdrawal given the fact that      the patient had ran out of her Klonopin approximately 5 days ago.      Primary seizure disorder was also on the differential, although      there is a question as to whether her prior seizure events are      actual seizures or a manifestation of her anxiety disorder.  The      patient has been worked up in the past for seizure-like activity      with previously negative results.  The patient initially received      Ativan x1 dose in the ED, then was restarted on a home dose of      Klonopin as above.  The patient's Imitrex, Lexapro, and Seroquel      were held on admission given potential for lowering seizure      threshold.  Neurology was consulted and EEG was recommended.  The      patient did not have any seizure-like episodes while in-house  and      had a negative EEG as above.  The patient was restarted on her      Lexapro and Seroquel, but her Imitrex was held for discharge.  The      patient will follow up at [facility] for      outpatient evaluation in 1 month.  2. Anxiety/depression.  The patient was restarted on Klonopin as above      and though initially anxious on admission, was stable with regards      to this problem on discharge.  The patient was also discharged home      on Seroquel and Lexapro for depression.  3. Short-term memory loss.  The patient has been worked up in the past      for this but the etiology remains unclear.  This problem was stable      over hospitalization.  4. Hypertension.  The patient's blood pressures were stable on      hydrochlorothiazide over the course of her hospitalization.  5. Hyperlipidemia.  The patient was continued on her home dose of      Zocor.  This problem was stable over the course of her      hospitalization.  6. Hypothyroidism history.  TSH was within  normal limits.  No      medications were started for this issue.   FOLLOWUP:  1. The patient is to follow up with Dr. Lelon Perla on January 13, 2009,      at 2:10 p.m.  2. The patient is to follow up with [facility] in      1 month for possible outpatient 24-hour EEG.   The patient was discharged to home in stable and improved condition.      Bobby Rumpf, MD  Electronically Signed      Pearlean Brownie, M.D.  Electronically Signed    KC/MEDQ  D:  01/13/2009  T:  01/14/2009  Job:  [id number]   cc:   Angelena Sole, MD  [facility]

## 2011-04-13 NOTE — Discharge Summary (Signed)
[name], FLYNN             ACCOUNT NO.:  [id number]   MEDICAL RECORD NO.:  [id number]          PATIENT TYPE:  INP   LOCATION:  4715                         FACILITY:  [facility]   PHYSICIAN:  Pearlean Brownie, M.D.DATE OF BIRTH:  25-Sep-1951   DATE OF ADMISSION:  08/25/2005  DATE OF DISCHARGE:  08/28/2005                                 DISCHARGE SUMMARY   DISCHARGE DIAGNOSES:  1.  Seizure-like activity.  2.  Hypertension.  3.  Anxiety.  4.  Hyperlipidemia.  5.  Hypothyroidism.  6.  Hypokalemia.   PROCEDURES:  Patient had an EEG done on Tuesday, August 28, 2005 which was  read as nonfocal.   DISCHARGE MEDICATIONS:  1.  Hydrochlorothiazide 25 mg daily.  2.  Lexapro 10 mg daily.  3.  Synthroid 125 mcg daily.  4.  Zocor 20 mg daily.  5.  K-Dur 20 mEq daily.  6.  Aspirin 81 mg daily.   HOSPITAL COURSE:  Patient presented to the emergency department on August 25, 2005 after her husband woke up next to the patient with the bed shaking  as well as the patient shaking.  This lasted for a few minutes and  subsequently she was disoriented and did not recognize her husband.  After  about five minutes she was more coherent and able to recognize family  members.  She had no further episodes after that initial incident.  She has  been having severe difficulties with short-term memory since April 2003 but  at that time had a normal neurologic work-up.  At this point the patient is  responsive with a normal physical examination and neurologic was called to  see her in the ED.  In addition to neurology's consult patient received  cardiac enzymes in order to evaluate the seizure-like activity which were  negative x3.  She also received a drug screen that was only positive for  benzos.   REVIEW OF SYSTEMS:  Positive for shaking spells and anxiety, but negative  for chest pain, cough, or diarrhea.   PHYSICAL EXAMINATION:  VITAL SIGNS:  In the ED the patient was afebrile with  a  pulse of 77, respirations of 18, and blood pressure of 119/72.  CARDIAC:  Regular rate and rhythm with no murmurs or gallops.  LUNGS:  Clear to auscultation.  ABDOMEN:  Soft, nontender, nondistended.  HEENT:  Head was NCAT.  NECK:  Without bruit.  NEUROLOGIC:  She is awake, alert, and cooperative.  There is no aphasia,  apraxia, or dysarthria.  Pupils are equal, round, and reactive to light and  accommodation.  Her visual acuity is intact.  Her face is symmetric.  Palatal movements are normal.  Tongue is midline.  The patient, however,  does have decreased short-term memory recalling 0/3 objects.  She has some  decreased animal naming, but can follow three-step commands.  She can name  and comprehend well.  Motor system reveals symmetric strength, tone  reflexes, coordination, and sensation.  Her gait was normal and her reflexes  are symmetric.   LABORATORY DATA:  CT scan of head is unremarkable.  WBC was  4.1, hemoglobin  11.9, hematocrit 35.5, platelets of 241.  Sodium was 138, potassium was 3,  chloride 105, bicarbonate 29, BUN 5, creatinine 0.8, glucose 120, calcium  8.9.  Magnesium 2.2, T-bili 0.4, B-bilirubin less than 0.1, alkaline  phosphatase 68, AST 25, ALT 14, T-protein 6.5, albumin 3.5.   HOSPITAL COURSE:  #1 - SEIZURE-LIKE ACTIVITY:  Patient was admitted for  evaluation and work-up of her seizure-like activity that occurred while at  home with her husband.  Once she arrived at the hospital she had no further  seizure-like episodes.  Her head CT was read as normal and her EEG was  nonfocal.  Dr. Pearlean Brownie from neurology felt that the patient was safe to go  home without any antiseizure medication and she was scheduled to follow up  with Dr. Orlin Hilding in neurology on Wednesday, October 18 at 8:30 a.m.   #2 - HYPERTENSION:  The patient was admitted with a history of hypertension;  however, her blood pressures while she was an inpatient were well controlled  on her  hydrochlorothiazide 25 mg and since this issue remained stable  throughout her hospitalization she was discharged home on her home dose.   #3 - DEPRESSION/ANXIETY:  Patient has been struggling with  depression/anxiety since April 2003 when she developed problems with her  short-term memory.  Her mood and affect appeared to be within normal limits  on this admission so she was continued on her home dose of Lexapro which is  10 mg daily and she will continue to follow this up as an outpatient.   #4 - HYPERLIPIDEMIA:  The patient has a known history of high cholesterol.  This was not an issue during this hospitalization and she was maintained on  her home dose of Zocor 20 mg daily.   #5 - HYPOTHYROIDISM:  Patient has a known history of hypothyroidism for  which she takes Synthroid 125 mcg.  Her TSH was within normal limits at  2.817 so no changes were made to her medication at this time.   #6 - HYPOKALEMIA:  On admission patient was found to have a potassium of 3.  Because of this she was started on K-Dur 20 mEq and on discharge her  potassium had rebounded to 3.7.   DISCHARGE LABORATORIES:  The patient's discharge laboratories that were done  on August 27, 2005 are as follows:  White count 4.6, hemoglobin 12.3,  hematocrit 36.3, and platelets of 233.  Sodium was 139, potassium 3.7,  chloride was 104, bicarbonate was 29, BUN was 8, creatinine 0.7, glucose was  102, and calcium was 8.9.   FOLLOW-UP ITEMS:  1.  Patient is to return to her primary care physician, Dr. Larina Bras, on      Tuesday, October 10 at 2 p.m.  2.  Patient is to return to Dr. Orlin Hilding in neurology on Wednesday, October      18 at 8:30 in the morning.  3.  Patient is to continue taking her medication as indicated on her      discharge instructions.     ______________________________  Neena Rhymes, M.D.    ______________________________  Pearlean Brownie, M.D.   KT/MEDQ  D:  09/06/2005  T:  09/06/2005  Job:   [id number]   cc:   Ace Gins, MD  Fax: [phone]   Pramod P. Pearlean Brownie, MD  Fax: [phone]

## 2011-04-13 NOTE — Consult Note (Signed)
NAMEMALLERY, HARSHMAN             ACCOUNT NO.:  [id number]   MEDICAL RECORD NO.:  [id number]          PATIENT TYPE:  INP   LOCATION:  1825                         FACILITY:  [facility]   PHYSICIAN:  Pramod P. Pearlean Brownie, MD    DATE OF BIRTH:  [birth year]   DATE OF CONSULTATION:  08/25/2005  DATE OF DISCHARGE:                                   CONSULTATION   REFERRING PHYSICIAN:  Family practice teaching service.   REASON FOR CONSULTATION:  Seizure.   HISTORY OF PRESENT ILLNESS:  Mrs. Pflieger is a 60 year old African American  lady who was brought to the emergency room by her family for possible  unwitnessed seizures.  The patient was unable to provide any history as she  does not remember the episode.  The patient's husband apparently woke up  with the bed shaking and the patient shaking.  This lasted apparently nor  more than a few minutes. Subsequently she was disoriented and did not  recognize her husband.  After about five minutes, she was more coherent and  could recognize family members.  Since then she has had no further episodes.  The patient's daughter who was with her in the emergency room noticed an  episode of the patient holding onto her hand tightly and saying she is not  feeling well and having a glazed look in her eyes.  When the doctor spoke to  her, she was responsive and this episode did not last more than a minute or  so.  The patient has had a history of recurrent stereotypical episodes for  the last three years which have been evaluated in the past for possible  seizures but no clear diagnosis has been made.  She was initially admitted  in April 2003 with altered mental status and weak following a febrile  illness.  At that time she had predominant short-term memory difficulties  and attention problems on exam.  CT scan of the head was unremarkable.  MRI  scan of the brain at that time and showed only white matter changes.  Spinal  tap showed slightly elevated protein  but no cells.  EEG showed no seizure  activity.  The patient was seen by neurology and the etiology of her mental  changes was not clear.  Psychiatry saw her and thought she had severe  depression and she was started on medications for that.  Then the family  stated that she had some improvement in her long-term memory but she still  has had significant short-term memory problems which persisted.  She has had  extensive outpatient evaluation as well by neurology including EEG and MRI  scan which were unremarkable.  No definite diagnosis of seizures was made.  However, the patient has had episodes which are stereotypical that occur at  a frequency of once every two to three months.  The patient's sister and  daughter describe these episodes as the patient complaining of an aura of a  strange smell followed by numbness in her head and she holds on to people  and has slight trembling.  This goes on for a few  minutes.  The patient has  a glazed look on her face, but at times can communicate and speak when  spoken to.  She is not particularly confused or sleepy following the  episodes.  There has been no witnessed tonic clonic activity in any of these  episodes.   PAST MEDICAL HISTORY:  1.  Hyperlipidemia.  2.  Anxiety.  3.  Hypertension.  4.  Migraine.  5.  Hypothyroidism.   HOME MEDICATIONS:  Aspirin, Lexapro, hydrochlorothiazide, triamterene,  Risperdal, Lipitor, clonazepam, Synthroid.   SOCIAL HISTORY:  The patient is married.  Lives with her husband.   REVIEW OF SYSTEMS:  Significant for shaking spells, anxiety.  No chest pain,  cough, or diarrheal illness.   PHYSICAL EXAMINATION:  GENERAL:  Middle-aged, anxious-looking African  American lady who is not in distress.  VITAL SIGNS:  She is afebrile.  Pulse rate 77 per minute, regular.  Respiratory rate 18 per minute.  Blood pressure 119/72.  Distal pulses were  well felt.  HEAD:  Atraumatic.  EENT exam unremarkable.  NECK:   Supple without bruit.  CARDIAC: Unremarkable.  No murmur or gallop.  LUNGS:  Clear to auscultation.  NEUROLOGIC:  The patient is pleasant, awake, alert, cooperative.  There is  no aphasia, apraxia, or dysarthria.  Pupils are equal, round and reactive to  light and accommodation.  Visual acuity intact.  Face is symmetric.  Palatal  movements are normal.  Tongue is midline.  On __________ exam, patient has  decreased recall, 0 out of 3.  She has some decreased animal naming but can  follow three-step commands.  She can name and comprehend very well.  Motor  system exam reveals symmetric strength, tone, reflexes, coordination, and  sensation.  Her gait was not tested.  Reflexes are symmetric.  Plantars are  downgoing.  Coordination is normal.   LABORATORY DATA:  CT scan of the head is unremarkable.  Previous discharge  summary from April 2003 as well as MRI findings from that time were  reviewed.   IMPRESSION:  A 60 year old lady with episode of brief altered consciousness  and extremity shaking of unclear etiology.  She has had recurrent multiple  stereotypical episodes in the past which have been evaluated extensively for  seizures and no definite diagnosis has been made.  Perhaps episodes are  related to stress and anxiety.   PLAN:  At the present time, I will hold off on anticonvulsant therapy unless  more definite evidence of seizures is obtained.  Check EEG as well as MRI  scan of the brain.  Urine drug screen.  Continue present psychiatric  medications.  I would like to review her previous neurological testing  records if available.  I would be happy to follow her.  Kindly call for  questions.           ______________________________  Sunny Schlein. Pearlean Brownie, MD     PPS/MEDQ  D:  08/25/2005  T:  08/25/2005  Job:  [id number]

## 2011-04-13 NOTE — Procedures (Signed)
EEG NUMBER:  [id number].   EEG ORDERED BY:  Delia Heady, MD.   REASON:  Uncertain; the patient has baseline mild memory difficulties.   DESCRIPTION OF PROCEDURE:  This is a 60 year old woman.  This was a routine  17-channel EEG with 1 channel devoted to EKG utilizing the International  10/20 Lead Placement System.  The patient was described as being awake  clinically.  Electrographically, she appeared to be awake through the  majority of the recording, but occasionally was drowsy.  While well-awake,  the background consisted of a well-organized, well-developed, well-modulated  10-Hz alpha activity which was predominant in the posterior head regions and  reactive to eye opening.  There was a little bit of attenuation a few times  into the high theta/ low alpha range with some attenuation in the background  consistent with drowsiness.  Activation procedures were performed, but there  was no significant change in the background activity with either  hyperventilation or photic stimulation, with the exception of the patient  actually appearing to be somewhat more alert with any hyperventilation.  No  interhemispheric asymmetry was identified and no definite epileptiform  discharges were seen.  The EKG monitor reveals relatively regular rhythm  with a rate of 60 beats per minute.   CONCLUSION:  Essentially normal awake and drowsy EEG with no seizure  activity or focal abnormality seen during the course of today's recording.  Clinical correlation is recommended.      Catherine A. Orlin Hilding, M.D.  Electronically Signed     ZOX:WRUE  D:  08/28/2005 19:03:11  T:  08/29/2005 06:35:51  Job #:  [id number]   cc:   Pramod P. Pearlean Brownie, MD  Fax: [phone]   Pearlean Brownie, M.D.  Fax: [phone]

## 2011-04-13 NOTE — Discharge Summary (Signed)
[place]  Patient:    [name], [name] Visit Number: [id number] MRN: [id number]          Service Type: MED Location: 3000 [id number] Attending Physician:  Sanjuana Letters Dictated by:   Michell Heinrich, M.D. Admit Date:  03/03/2002 Discharge Date: 03/07/2002                             Discharge Summary  ADMISSION DIAGNOSES: 1. Delirium. 2. History of febrile illness. 3. Hyponatremia. 4. Hypokalemia. 5. Transaminasemia.  DISCHARGE DIAGNOSES: 1. Delirium. 2. History of febrile illness. 3. Hyponatremia. 4. Hypokalemia. 5. Transaminasemia. 6. Psychotic disorder, not otherwise specified.  DISCHARGE MEDICATIONS: 1. Abilify 10 mg tablets, 1 tab p.o. q.d. at 6 p.m. 2. Synthroid 125 mcg per day. 3. Hydrochlorothiazide 25 mg q.d. 4. Aspirin 81 mg q.d.  CONSULTS: 1. Neurology on March 04, 2002. 2. Psychiatry on March 06, 2002.  PROCEDURES:  Lumbar puncture on March 04, 2002.  INDICATIONS:  Fever and delirium.  COMPLICATIONS:  None.  HISTORY AND PHYSICAL:  For complete history and physical, please see the resident history and physical in the chart.  Briefly, this is a 60 year old African-American female with a history of hypertension and hypothyroidism who was admitted with acute mental status changes after a 5-6 day course of afebrile illness.  Her main problems were short-term and long-term memory dysfunction and deficit in attention.  She was afebrile on admission and neurologic exam was nonfocal, and mini-mental status exam was 19/30. Laboratory work revealed a white blood cell count of 3.1, otherwise CBC was within normal limits.  Basic metabolic panel revealed a sodium 133 and a potassium of 3.3, and otherwise was normal.  Hepatic panel showed AST 83, ALT 82, and was otherwise normal.  UA was negative and a head CT was negative. the patient was admitted for further observation and workup.  HOSPITAL COURSE:  MENTAL  STATUS CHANGES:  Initial labs on physical exam did not reveal an obvious etiology.  Lumbar puncture results showed a mildly elevated protein at 56, but was otherwise normal with no white blood cells, no organisms on gram stain, and normal glucose and no red blood cells.  The patient continued to have short-term and some long-term memory dysfunction and a neurologic exam which does not reveal any deficits.  Neurology was consulted and they felt that subclinical seizures may be a possibility and an EEG was done, which was negative.  They then recommended an MRI of the brain which showed some mild paraventricular white matter disease but, otherwise, was normal.  Since there was still no clear organic cause to her mental status changes, she continued to display memory defects in addition to some paranoia and withdrawn behavior. A psychiatric consult was obtained to rule out psychosis.  Dr. Jeanie Sewer saw her and his impression was that she had major depressive disorder with psychotic features.  He recommended starting her on Abilify at 5 mg daily and this was done.  She tolerated this medicine without problem and did still demonstrate some of the deficits that she was admitted with.  She was revisited by Dr. Jeanie Sewer and it was his recommendations that she have her Abilify increased to 10 mg per day and he did recommend inpatient psychiatric treatment.  However, the family and the patient were adamant that she just go with intensive outpatient therapy and this is the direction that was taken.  DISPOSITION:  The  patient was discharged to home in improved condition and recommended to take her previously mentioned discharge medications.  They were instructed to call EMS if any agitation or distress.  Followup was arranged for March 09, 2002 at 2 p.m. at behavioral health for inpatient psychiatric evaluation.  She was instructed to call Dr. Lazarus Salines at [facility] for further  followup.  PERTINENT LABORATORY DATA: 1. Pending at discharge were antidouble strata DNA, ______ antibody, and    ANA titers. 2. CMET results March 06, 2002 were sodium 130, potassium 3.7, chloride 97,    bicarbonate 27, BUN 7, creatinine 0.6, glucose 117, AST 62, ALT 71. Dictated by:   Michell Heinrich, M.D. Attending Physician:  Sanjuana Letters DD:  03/07/02 TD:  03/09/02 Job: [id number]

## 2011-04-14 ENCOUNTER — Other Ambulatory Visit: Payer: Self-pay | Admitting: Family Medicine

## 2011-04-15 NOTE — Telephone Encounter (Signed)
Refill request

## 2011-04-25 NOTE — Telephone Encounter (Signed)
RX was in file in front office , husband calls requesting rx be faxed, faxed as requested.

## 2011-04-27 ENCOUNTER — Telehealth: Payer: Self-pay | Admitting: Family Medicine

## 2011-04-27 NOTE — Telephone Encounter (Signed)
Asking to speak with RN about pts meds, normally gets 90 per month since she takes it 3 x daily & last Rx was only written for 40 tablets.

## 2011-04-27 NOTE — Telephone Encounter (Addendum)
Husband states patient just had klonopin refilled and she takes three times daily but MD only gave her # 40 tabs. Will send message to MD to please advises and redo RX if indicated. RN will call patient back when MD has done this. Explained to husband that it will be Monday when I call back and he is agreeable.

## 2011-04-30 ENCOUNTER — Encounter: Payer: Self-pay | Admitting: *Deleted

## 2011-04-30 DIAGNOSIS — I10 Essential (primary) hypertension: Secondary | ICD-10-CM

## 2011-04-30 MED ORDER — CLONAZEPAM 0.5 MG PO TABS: 0.5000 mg | ORAL_TABLET | Freq: Three times a day (TID) | ORAL | Status: DC | PRN

## 2011-04-30 NOTE — Telephone Encounter (Signed)
This encounter was created in error - please disregard.

## 2011-04-30 NOTE — Telephone Encounter (Signed)
Updated prescription is available up front.  Since they got 40 and then 90, the next refill will not be due until 1.5 months.

## 2011-06-08 ENCOUNTER — Other Ambulatory Visit: Payer: Self-pay | Admitting: Family Medicine

## 2011-06-08 MED ORDER — CLONAZEPAM 0.5 MG PO TABS: 0.5000 mg | ORAL_TABLET | Freq: Three times a day (TID) | ORAL | Status: DC | PRN

## 2011-06-08 NOTE — Telephone Encounter (Signed)
To MD

## 2011-06-08 NOTE — Telephone Encounter (Signed)
pts husband calling requesting refill on klonopin.

## 2011-06-08 NOTE — Telephone Encounter (Signed)
Reviewed notes regarding Klonopin. Pt now due for refill for one mos supply. Will approve refill.

## 2011-06-11 ENCOUNTER — Ambulatory Visit (INDEPENDENT_AMBULATORY_CARE_PROVIDER_SITE_OTHER): Payer: Medicare Other | Admitting: Family Medicine

## 2011-06-11 VITALS — BP 110/74 | HR 64 | Temp 99.3°F | Ht 64.0 in | Wt 152.4 lb

## 2011-06-11 DIAGNOSIS — E039 Hypothyroidism, unspecified: Secondary | ICD-10-CM

## 2011-06-11 DIAGNOSIS — G40309 Generalized idiopathic epilepsy and epileptic syndromes, not intractable, without status epilepticus: Secondary | ICD-10-CM

## 2011-06-11 DIAGNOSIS — F070 Personality change due to known physiological condition: Secondary | ICD-10-CM

## 2011-06-11 LAB — CBC
HCT: 36.8 % (ref 36.0–46.0)
Hemoglobin: 12.8 g/dL (ref 12.0–15.0)
MCH: 31 pg (ref 26.0–34.0)
MCHC: 34.8 g/dL (ref 30.0–36.0)
MCV: 89.1 fL (ref 78.0–100.0)
Platelets: 275 10*3/uL (ref 150–400)
RBC: 4.13 MIL/uL (ref 3.87–5.11)
RDW: 14.5 % (ref 11.5–15.5)
WBC: 4 10*3/uL (ref 4.0–10.5)

## 2011-06-11 LAB — BASIC METABOLIC PANEL
BUN: 10 mg/dL (ref 6–23)
CO2: 27 mEq/L (ref 19–32)
Calcium: 9.7 mg/dL (ref 8.4–10.5)
Chloride: 96 mEq/L (ref 96–112)
Creat: 0.71 mg/dL (ref 0.50–1.10)
Glucose, Bld: 117 mg/dL — ABNORMAL HIGH (ref 70–99)
Potassium: 3.3 mEq/L — ABNORMAL LOW (ref 3.5–5.3)
Sodium: 135 mEq/L (ref 135–145)

## 2011-06-11 LAB — TSH: TSH: 0.013 u[IU]/mL — ABNORMAL LOW (ref 0.350–4.500)

## 2011-06-11 NOTE — Patient Instructions (Addendum)
We are drawing some labs today. Based on these labs, I will let you know what the new dose of Synthroid should be; it will need to be decreased, I am just not sure by how much yet. I also think that Roylene should be on an anti-seizure medicine.   Let me know when you will be able to get seen by [facility] and we will decide if we should start an anti-seizure medicine before that appointment.  Please follow up with Dr. Armen Pickup in the next 4-6 weeks.

## 2011-06-12 ENCOUNTER — Telehealth: Payer: Self-pay | Admitting: Family Medicine

## 2011-06-12 ENCOUNTER — Encounter: Payer: Self-pay | Admitting: Family Medicine

## 2011-06-12 NOTE — Telephone Encounter (Signed)
Yes, most likely we should go ahead and start her on Keppra.  Either Josalyn can decide on a dose since this is her regular patient, or I can talk with the preceptors tomorrow morning when I am in clinic about a dose.  In addition, she needs to DECREASE her synthroid as we discussed.  Current dose is 125, will likely decrease to 75, but again, will defer to [facility] if she would like or will d/w preceptors tomorrow.

## 2011-06-12 NOTE — Telephone Encounter (Signed)
Was told that she needed to check to see if she can get back in at [facility] for her seizures and they found out it will be 6-8 months before she can get in.  Does the doctor want to go ahead a prescribe anti-seizure meds?

## 2011-06-12 NOTE — Assessment & Plan Note (Signed)
Last TSH low; will recheck TSH and likely decrease synthroid dose; current dose is 125.  Could be potential contributor to lowered seizure threshold.

## 2011-06-12 NOTE — Assessment & Plan Note (Signed)
Recurrence x2 last night, lasting 3-4 min with post-ictal state, now back at neuro baseline.  Likely no cyanosis or aspiration based on husband's story.  Will have pt's husband call [facility] and he would like them to become pt's primary neurologist.  If unable to be seen in a timely fashion in the next few months, will likely need to start Keppra before being seen; otherwise, will wait for [facility] to start anti-epileptic meds. Given seizure precautions. Pt is not driving.

## 2011-06-12 NOTE — Telephone Encounter (Signed)
Fwd to PCP .[name]  

## 2011-06-12 NOTE — Telephone Encounter (Signed)
Fwd to Dr.McGill and PCP

## 2011-06-13 ENCOUNTER — Telehealth: Payer: Self-pay | Admitting: Family Medicine

## 2011-06-13 NOTE — Assessment & Plan Note (Signed)
Stable, at her baseline per her husband.  Negative w/u in the past.  No changes needed.

## 2011-06-13 NOTE — Progress Notes (Signed)
S: Pt comes in today for seizure last night.  SEIZURE Pt with known sz d/o, last sz ~6-8 months ago; not being followed by neurologist (sounds like she was being followed, but husband would like to be seen by [facility]).  Has never been on anti-sz meds in the past. Per husband, had a normal EEG ~1 year ago. Per pt's husband, around 1am pt had a seizure, generalized violent shaking in bilateral upper and lower extremities then fell into a deep sleep snoring; this happened again around 1:30am.  Episodes lasted 3-4 min, ended spontaneously w/o intervention.  Per husband, no cyanosis or vomiting.  No fevers or recent illnesses, no recent med changes (Seroquel dose was decreased in April but that was last med change); "came out of no where."  Is back to baseline today (pt w/ known memory loss).  HYPOTHYROIDISM Last TSH in April was low; synthroid dose was never changed.  No s/s of hyperthyroidism, including no heat intolerance, skin/hair/nail changes, weight loss.  Is having difficulty sleeping, but husband contributes this to decreased seroquel dose.  Could have contributed to decreased seizure threshold.    ROS: Per HPI  History  Smoking status  . Never Smoker   Smokeless tobacco  . Not on file    O:  Filed Vitals:   06/11/11 1324  BP: 110/74  Pulse: 64  Temp: 99.3 F (37.4 C)    Gen: NAD; obvious memory loss, asked repetitive questions.  HEENT: EOMI, PERRLA, MMM, no pharyngeal erythema or exudate; no papilledema, no scleral icterus, no cervical LAD CV: RRR, no murmur Pulm: CTA bilat, no wheezes or crackles Ext: Warm, no chronic skin changes, no edema Neuro: CN 2-12 intact; strength 5/5 bilat upper and lower ext; sensation intact throughout   A/P: 60 y.o. female w/ known seizure d/o p/w seizure -See problem list -f/u at [facility] w/ PCP in next 2-4 weeks if unable to be seen by Neuro w/in the next 2 months

## 2011-06-13 NOTE — Telephone Encounter (Signed)
To MD.  The med was actually printed but im not sure where it was placed after that.  Will forward to MD for clarification Satya Buttram, Maryjo Rochester

## 2011-06-13 NOTE — Telephone Encounter (Signed)
Calling re: pts rx for klonopin, according to medlist it was sent to the pharmacy on 7/13 but husband says he has been to the pharmacy several times and they do not have it.

## 2011-06-14 NOTE — Telephone Encounter (Signed)
MD paged. [name]  

## 2011-06-14 NOTE — Telephone Encounter (Signed)
pts husband is calling back, is asking for [place] to page the MD, says pt is taking her last pill and needs this today.

## 2011-06-14 NOTE — Telephone Encounter (Signed)
Unable to Reach MD, Rx called in verbally to [facility].  Pts husband informed. Deshanti Adcox, Maryjo Rochester

## 2011-06-15 ENCOUNTER — Telehealth: Payer: Self-pay | Admitting: Family Medicine

## 2011-06-15 DIAGNOSIS — E039 Hypothyroidism, unspecified: Secondary | ICD-10-CM

## 2011-06-15 MED ORDER — LEVOTHYROXINE SODIUM 100 MCG PO TABS: 100.0000 ug | ORAL_TABLET | Freq: Every day | ORAL | Status: DC

## 2011-06-15 NOTE — Assessment & Plan Note (Signed)
Reviewed TSH, low. Will decrease synthroid to 100 mcg and recheck TSH in 1-2 months.

## 2011-06-15 NOTE — Telephone Encounter (Signed)
Message copied by Dessa Phi on Fri Jun 15, 2011  2:03 PM ------      Message from: Demetria Pore A      Created: Thu Jun 14, 2011  3:00 PM                   ----- Message -----         From: [facility] [place] Interface         Sent: 06/11/2011  10:29 PM           To: Demetria Pore

## 2011-06-18 NOTE — Telephone Encounter (Signed)
Called pt at home. Phone call completed. Pt has picked up Klonopin and new script for synthroid 100 mcg qD.  Asked pt to call in and schedule f/u with me to discuss initiation of anti-epileptic medication.

## 2011-06-26 ENCOUNTER — Ambulatory Visit (INDEPENDENT_AMBULATORY_CARE_PROVIDER_SITE_OTHER): Payer: Medicare Other | Admitting: Family Medicine

## 2011-06-26 ENCOUNTER — Encounter: Payer: Self-pay | Admitting: Family Medicine

## 2011-06-26 DIAGNOSIS — G40309 Generalized idiopathic epilepsy and epileptic syndromes, not intractable, without status epilepticus: Secondary | ICD-10-CM

## 2011-06-26 DIAGNOSIS — F329 Major depressive disorder, single episode, unspecified: Secondary | ICD-10-CM

## 2011-06-26 DIAGNOSIS — F411 Generalized anxiety disorder: Secondary | ICD-10-CM

## 2011-06-26 DIAGNOSIS — F3289 Other specified depressive episodes: Secondary | ICD-10-CM

## 2011-06-26 MED ORDER — ESCITALOPRAM OXALATE 20 MG PO TABS: 20.0000 mg | ORAL_TABLET | Freq: Every day | ORAL | Status: DC

## 2011-06-26 MED ORDER — CLONAZEPAM 0.5 MG PO TABS: 0.5000 mg | ORAL_TABLET | Freq: Three times a day (TID) | ORAL | Status: DC | PRN

## 2011-06-26 MED ORDER — HYDROCHLOROTHIAZIDE 25 MG PO TABS: 25.0000 mg | ORAL_TABLET | Freq: Every day | ORAL | Status: DC

## 2011-06-26 MED ORDER — LEVOTHYROXINE SODIUM 100 MCG PO TABS: 100.0000 ug | ORAL_TABLET | Freq: Every day | ORAL | Status: DC

## 2011-06-26 MED ORDER — SUMATRIPTAN SUCCINATE 100 MG PO TABS: 100.0000 mg | ORAL_TABLET | ORAL | Status: DC

## 2011-06-26 NOTE — Patient Instructions (Signed)
Please see front desk to schedule with Dr. Pascal Lux for mood disorder clinic to address anxiety.   -Dr. Armen Pickup

## 2011-06-26 NOTE — Progress Notes (Signed)
  Subjective:    Patient ID: [name], female    DOB: 24-Nov-1951, 60 y.o.   MRN: [id number]  HPI Pt is here accompanied by husband to discuss recent seizure activity and anxiety.  Seizure activity: see previous office note for details. No repeat events. Pt on waiting list to be evaluated by [facility]. Has been seen by [facility] in the past. Most recently she was seen by Dr. Pearlean Brownie as a consult in the ED in early May 2012.  Never on medication for seizure prophylaxis. Last seizure characterized by generalized tonic clonic with eye rolling. Seizures have occurred at sleep and during rest. One seizure occurred in the neurologist office per husband.   Anxiety: pt tearful and upset when talking about he medical history. She is annoyed with her short term memory loss and her family speaking for her. She reports worsening anxiety attacks. She reports 7 episodes today. Episodes characterized by weakness and sweating and drooling. She denies CP, SOB, N/V, palpitations.   Hypothyroidism: pt picked up new script for decreased dose of synthroid.      Review of Systems As per HPI    Objective:   Physical Exam  Nursing note and vitals reviewed. Constitutional: She is oriented to person, place, and time. She appears well-developed and well-nourished.  HENT:  Head: Normocephalic and atraumatic.  Mouth/Throat: Oropharynx is clear and moist.  Neck: Normal range of motion. Neck supple.  Cardiovascular: Normal rate, regular rhythm and normal heart sounds.   Pulmonary/Chest: Effort normal and breath sounds normal.  Abdominal: Soft. Bowel sounds are normal.  Musculoskeletal: Normal range of motion.  Neurological: She is alert and oriented to person, place, and time. No cranial nerve deficit. She exhibits normal muscle tone. Coordination normal.  Skin: Skin is warm and dry.  Psychiatric: Her speech is normal. Judgment and thought content normal. Her mood appears not anxious. She is not agitated.  She does not exhibit a depressed mood. She exhibits abnormal recent memory.          Assessment & Plan:   No problem-specific assessment & plan notes found for this encounter.

## 2011-06-28 NOTE — Assessment & Plan Note (Addendum)
Reviewed previous history related to seizure activity with Dr. Jennette Kettle. Pt with normal CT head x 3, normal EEG, normal MIRI, normal LP. Followed by neurology in the past, documented "seizure-like activity" but no evidence of true seizure. Discussed with Dr. Jennette Kettle, managing seizures is not within the realm of my specialty. Discussed with pt and husband. Encouraged f/u with Dr. Pearlean Brownie at [facility] neurology since he was willing to follow-up with pt.vs. waiting for [facility]. Pt and husband desire to wait. Recommended seizure precautions. Also recommended pt and husband present to ED if seizures recur.

## 2011-06-28 NOTE — Assessment & Plan Note (Signed)
Pt with history of depression. No recent changes in mood. Plan: continue lexapro. Refer to mood disorder clinic.

## 2011-06-28 NOTE — Assessment & Plan Note (Signed)
Worsening. Continue current regimen.  Refer to mood disorder clinic for cognitive behavioral therapy.

## 2011-08-17 ENCOUNTER — Encounter: Payer: Self-pay | Admitting: Family Medicine

## 2011-08-17 ENCOUNTER — Ambulatory Visit (INDEPENDENT_AMBULATORY_CARE_PROVIDER_SITE_OTHER): Payer: Medicare Other | Admitting: Family Medicine

## 2011-08-17 VITALS — BP 135/73 | HR 67 | Temp 98.3°F | Ht 64.0 in | Wt 152.9 lb

## 2011-08-17 DIAGNOSIS — R21 Rash and other nonspecific skin eruption: Secondary | ICD-10-CM

## 2011-08-17 DIAGNOSIS — Z1239 Encounter for other screening for malignant neoplasm of breast: Secondary | ICD-10-CM

## 2011-08-17 DIAGNOSIS — Z1231 Encounter for screening mammogram for malignant neoplasm of breast: Secondary | ICD-10-CM

## 2011-08-17 LAB — POCT SKIN KOH: Skin KOH, POC: NEGATIVE

## 2011-08-17 MED ORDER — TRIAMCINOLONE ACETONIDE 0.1 % EX OINT: TOPICAL_OINTMENT | Freq: Two times a day (BID) | CUTANEOUS | Status: DC

## 2011-08-17 NOTE — Progress Notes (Signed)
  Subjective:    Patient ID: [name], female    DOB: 1951/07/11, 60 y.o.   MRN: [id number]  HPI Pruritic rash on neck for 2 months.  She has been using antifungals without relief    Review of Systems     Objective:   Physical Exam Dry scaley, rash covering 50% of anterior neck.   KOH neg        Assessment & Plan:

## 2011-08-17 NOTE — Patient Instructions (Signed)
You have eczema There is a prescription for an ointment that you should use two times each day.  Use only a small amount Keep using the ointment until the rash clears up and for 3 more days. See [place] again in 2 weeks if the rash is not clearing up.  If the rash is clearing up, we don't need to see you again for this problem.

## 2011-08-25 ENCOUNTER — Other Ambulatory Visit: Payer: Self-pay | Admitting: Family Medicine

## 2011-08-25 NOTE — Telephone Encounter (Signed)
Refill request

## 2011-10-02 ENCOUNTER — Other Ambulatory Visit: Payer: Self-pay | Admitting: Family Medicine

## 2011-10-03 MED ORDER — HYDROXYZINE HCL 25 MG PO TABS: 25.0000 mg | ORAL_TABLET | Freq: Four times a day (QID) | ORAL | Status: DC | PRN

## 2011-10-03 MED ORDER — CLONAZEPAM 0.5 MG PO TABS: 0.5000 mg | ORAL_TABLET | Freq: Three times a day (TID) | ORAL | Status: DC | PRN

## 2011-10-03 NOTE — Telephone Encounter (Signed)
Hydroxyzine, seroquel and clonazepam refilled. Clonazepam called in.

## 2011-10-03 NOTE — Telephone Encounter (Signed)
Refill request

## 2011-10-12 ENCOUNTER — Other Ambulatory Visit: Payer: Self-pay | Admitting: Family Medicine

## 2011-10-12 MED ORDER — CLONAZEPAM 0.5 MG PO TABS: 0.5000 mg | ORAL_TABLET | Freq: Two times a day (BID) | ORAL | Status: DC | PRN

## 2011-10-12 MED ORDER — HYDROCHLOROTHIAZIDE 25 MG PO TABS: 25.0000 mg | ORAL_TABLET | Freq: Every day | ORAL | Status: DC

## 2011-10-12 NOTE — Telephone Encounter (Signed)
Received refill request sent in refills, changed clonazepam to BID prn. Pt called about change.

## 2012-01-08 ENCOUNTER — Other Ambulatory Visit: Payer: Self-pay | Admitting: Family Medicine

## 2012-01-08 MED ORDER — QUETIAPINE FUMARATE 50 MG PO TABS: 50.0000 mg | ORAL_TABLET | Freq: Every day | ORAL | Status: DC

## 2012-01-16 ENCOUNTER — Ambulatory Visit: Payer: Medicare Other | Admitting: Family Medicine

## 2012-02-19 ENCOUNTER — Telehealth: Payer: Self-pay | Admitting: Family Medicine

## 2012-02-19 NOTE — Telephone Encounter (Signed)
Need refill for Clonazepam.  Please call [name] when ready for pickup

## 2012-02-20 MED ORDER — CLONAZEPAM 0.5 MG PO TABS: 0.5000 mg | ORAL_TABLET | Freq: Two times a day (BID) | ORAL | Status: DC | PRN

## 2012-02-20 MED ORDER — HYDROXYZINE HCL 25 MG PO TABS: 25.0000 mg | ORAL_TABLET | Freq: Two times a day (BID) | ORAL | Status: DC | PRN

## 2012-02-20 NOTE — Telephone Encounter (Signed)
Spoke to patient's husband. He reports that stepping down klonopin to BID from TID has worsened anxiety. He feels that TID was better.  He reports that Lexapro did seem to help with anxiety.  We discussed plan to add another once daily (preferably) non-habit forming medication to help with anxiety (like Buspar) and keeping klonopin BID for now with ultimate goal of weaning to off. Will have to do this with history of seizures in mind. He reports that she is almost out of hydroxyzine as well.    Called pharmacy E scribed hydroxyzine. Called in klonopin 60 with 3 refills.

## 2012-04-16 ENCOUNTER — Other Ambulatory Visit: Payer: Self-pay | Admitting: Family Medicine

## 2012-04-16 MED ORDER — QUETIAPINE FUMARATE 50 MG PO TABS: 50.0000 mg | ORAL_TABLET | Freq: Every day | ORAL | Status: DC

## 2012-04-17 ENCOUNTER — Other Ambulatory Visit: Payer: Self-pay | Admitting: Family Medicine

## 2012-04-17 MED ORDER — ESCITALOPRAM OXALATE 20 MG PO TABS: 20.0000 mg | ORAL_TABLET | Freq: Every day | ORAL | Status: DC

## 2012-05-16 ENCOUNTER — Telehealth: Payer: Self-pay | Admitting: Family Medicine

## 2012-05-16 NOTE — Telephone Encounter (Signed)
Requesting rx for Clonazepam.  Please call when ready for pickup

## 2012-05-20 MED ORDER — CLONAZEPAM 0.5 MG PO TABS: 0.5000 mg | ORAL_TABLET | Freq: Two times a day (BID) | ORAL | Status: DC | PRN

## 2012-05-20 NOTE — Telephone Encounter (Signed)
Called refill in to [facility]'s [facility], [facility] with 2 refills. Left VM.  Called patient to inform. Spoke to patient [name] who will let her husband know that he has to pick up the prescription from the pharmacy.

## 2012-06-03 ENCOUNTER — Other Ambulatory Visit: Payer: Self-pay | Admitting: *Deleted

## 2012-06-03 MED ORDER — TRIAMCINOLONE ACETONIDE 0.1 % EX OINT: TOPICAL_OINTMENT | Freq: Two times a day (BID) | CUTANEOUS | Status: DC

## 2012-06-17 ENCOUNTER — Other Ambulatory Visit: Payer: Self-pay | Admitting: *Deleted

## 2012-06-17 MED ORDER — LEVOTHYROXINE SODIUM 100 MCG PO TABS: 100.0000 ug | ORAL_TABLET | Freq: Every day | ORAL | Status: DC

## 2012-07-04 ENCOUNTER — Ambulatory Visit (INDEPENDENT_AMBULATORY_CARE_PROVIDER_SITE_OTHER): Payer: Medicare Other | Admitting: Family Medicine

## 2012-07-04 ENCOUNTER — Encounter: Payer: Self-pay | Admitting: Family Medicine

## 2012-07-04 VITALS — BP 137/86 | HR 63 | Temp 98.3°F | Ht 64.0 in | Wt 154.0 lb

## 2012-07-04 DIAGNOSIS — F411 Generalized anxiety disorder: Secondary | ICD-10-CM

## 2012-07-04 DIAGNOSIS — E039 Hypothyroidism, unspecified: Secondary | ICD-10-CM

## 2012-07-04 DIAGNOSIS — E785 Hyperlipidemia, unspecified: Secondary | ICD-10-CM

## 2012-07-04 DIAGNOSIS — Z0000 Encounter for general adult medical examination without abnormal findings: Secondary | ICD-10-CM

## 2012-07-04 DIAGNOSIS — I10 Essential (primary) hypertension: Secondary | ICD-10-CM

## 2012-07-04 MED ORDER — CLONAZEPAM 0.5 MG PO TABS
0.5000 mg | ORAL_TABLET | Freq: Two times a day (BID) | ORAL | Status: DC | PRN
Start: 2012-07-04 — End: 2012-11-05

## 2012-07-04 MED ORDER — ZOSTER VACCINE LIVE 19400 UNT/0.65ML ~~LOC~~ SOLR: 0.6500 mL | Freq: Once | SUBCUTANEOUS | Status: AC

## 2012-07-04 NOTE — Progress Notes (Signed)
Subjective:     Patient ID: [name], female   DOB: March 03, 1951, 61 y.o.   MRN: [id number]  HPI 61 yo F presents for f/u physical and to discuss the following:  1. Anxiety: increasing. Has been bad for the past 3 days. Improves when she take klonopin. Marked by feeling fearful, feeling like she want to run but cannot move. 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 She spends most of her time at home with her husband. On good days she drives to church and to her sister's house. She feels that prayer and exercise help. She was receiving counseling in [place] in the past but has not sought counseling in many years.   2. HTN: well controlled. Denies CP, SOB, LE edema (except ankles and bunions).   3. Hypothyroidism: taking synthroid. No weight change, palpations or GI upset.   4. Health maintenance: no history of abnormal pap smears. Due for mammogram, colonoscopy and zostavax. Qualifies for wellness exam. Lives at home with her husband. She is not sexually active with him.   Review of Systems As per HPI     Objective:   Physical Exam BP 137/86  Pulse 63  Temp 98.3 F (36.8 C) (Oral)  Ht 5\' 4"  (1.626 m)  Wt 154 lb (69.854 kg)  BMI 26.43 kg/m2 General appearance: alert, cooperative and no distress Neck: no adenopathy, no carotid bruit, no JVD, supple, symmetrical, trachea midline and thyroid not enlarged, symmetric, no tenderness/mass/nodules Lungs: clear to auscultation bilaterally Heart: regular rate and rhythm, S1, S2 normal, no murmur, click, rub or gallop Extremities: extremities normal, atraumatic, no cyanosis or edema Neurologic: Grossly normal    Assessment and Plan:

## 2012-07-04 NOTE — Assessment & Plan Note (Signed)
A: declined with increase social isolation/lack of outdoor activity P: encourage volunteer work. Patient has already been thinking about volunteering at a nursing home.

## 2012-07-04 NOTE — Assessment & Plan Note (Signed)
Due for pap in 2014. Gave script/self referral for zostavx, mammogram and colonoscopy.

## 2012-07-04 NOTE — Patient Instructions (Addendum)
Mrs. Burgueno,  Thank you for coming in today,  Please continue all medications as prescribed.  1. Get zosters vaccine 2. Schedule mammogram 3. Schedule colonoscopy.  4. Come back for nurse visit for flu shot at the end of September   For anxiety: Continue medications Please find healthy outlets: exercise, volunteer work.  Schedule your wellness visit with Pamelia Hoit on your way out. F/u with me in 6 months.   Dr. Armen Pickup

## 2012-07-04 NOTE — Assessment & Plan Note (Signed)
A: BP normal Meds: compliant and tolerating. P: continue current regimen and check electrolytes

## 2012-07-04 NOTE — Assessment & Plan Note (Signed)
A: no evidence of hypo or hyperthyroidism on exam. Anxiety may be related to relative hyperthyroidism P:  recheck TSH and adjust dose as needed.

## 2012-07-05 LAB — LDL CHOLESTEROL, DIRECT: Direct LDL: 217 mg/dL — ABNORMAL HIGH

## 2012-07-05 LAB — TSH: TSH: 0.061 u[IU]/mL — ABNORMAL LOW (ref 0.350–4.500)

## 2012-07-05 LAB — COMPREHENSIVE METABOLIC PANEL
ALT: 12 U/L (ref 0–35)
AST: 18 U/L (ref 0–37)
Albumin: 4.3 g/dL (ref 3.5–5.2)
Alkaline Phosphatase: 84 U/L (ref 39–117)
BUN: 8 mg/dL (ref 6–23)
CO2: 31 mEq/L (ref 19–32)
Calcium: 9.9 mg/dL (ref 8.4–10.5)
Chloride: 95 mEq/L — ABNORMAL LOW (ref 96–112)
Creat: 0.7 mg/dL (ref 0.50–1.10)
Glucose, Bld: 102 mg/dL — ABNORMAL HIGH (ref 70–99)
Potassium: 3.4 mEq/L — ABNORMAL LOW (ref 3.5–5.3)
Sodium: 136 mEq/L (ref 135–145)
Total Bilirubin: 0.5 mg/dL (ref 0.3–1.2)
Total Protein: 7.7 g/dL (ref 6.0–8.3)

## 2012-07-09 ENCOUNTER — Telehealth: Payer: Self-pay | Admitting: Family Medicine

## 2012-07-09 DIAGNOSIS — E7800 Pure hypercholesterolemia, unspecified: Secondary | ICD-10-CM

## 2012-07-09 MED ORDER — LEVOTHYROXINE SODIUM 88 MCG PO TABS: 88.0000 ug | ORAL_TABLET | Freq: Every day | ORAL | Status: DC

## 2012-07-09 NOTE — Telephone Encounter (Signed)
Called patient.  1. Decreased synthroid to 88 mcg daily. Script sent.  2. LDL elevated. Asked patient to call in for lab visit for fasting LDL. Ordered.   She understood instructions and voiced understanding.

## 2012-08-20 ENCOUNTER — Other Ambulatory Visit: Payer: Self-pay | Admitting: *Deleted

## 2012-08-20 MED ORDER — HYDROXYZINE HCL 25 MG PO TABS: 25.0000 mg | ORAL_TABLET | Freq: Two times a day (BID) | ORAL | Status: DC | PRN

## 2012-09-29 ENCOUNTER — Ambulatory Visit: Payer: Medicare Other | Admitting: Family Medicine

## 2012-10-01 ENCOUNTER — Other Ambulatory Visit: Payer: Self-pay | Admitting: *Deleted

## 2012-10-04 MED ORDER — QUETIAPINE FUMARATE 50 MG PO TABS: 50.0000 mg | ORAL_TABLET | Freq: Every day | ORAL | Status: DC

## 2012-10-27 ENCOUNTER — Other Ambulatory Visit: Payer: Self-pay | Admitting: *Deleted

## 2012-10-27 MED ORDER — HYDROCHLOROTHIAZIDE 25 MG PO TABS: 25.0000 mg | ORAL_TABLET | Freq: Every day | ORAL | Status: DC

## 2012-10-27 MED ORDER — LEVOTHYROXINE SODIUM 88 MCG PO TABS: 88.0000 ug | ORAL_TABLET | Freq: Every day | ORAL | Status: DC

## 2012-11-05 ENCOUNTER — Other Ambulatory Visit: Payer: Self-pay | Admitting: Family Medicine

## 2012-11-05 MED ORDER — CLONAZEPAM 0.5 MG PO TABS
0.5000 mg | ORAL_TABLET | Freq: Two times a day (BID) | ORAL | Status: DC | PRN
Start: 2012-11-05 — End: 2013-04-21

## 2012-11-05 NOTE — Telephone Encounter (Signed)
Wants to know if Dr Armen Pickup has rec'd refill request on her clonazepam

## 2012-11-05 NOTE — Telephone Encounter (Signed)
No refill request. Will print refill to be faxed.

## 2012-11-11 ENCOUNTER — Other Ambulatory Visit: Payer: Self-pay | Admitting: *Deleted

## 2012-11-12 MED ORDER — LEVOTHYROXINE SODIUM 88 MCG PO TABS: 88.0000 ug | ORAL_TABLET | Freq: Every day | ORAL | Status: DC

## 2013-01-05 ENCOUNTER — Other Ambulatory Visit: Payer: Self-pay | Admitting: *Deleted

## 2013-01-05 MED ORDER — QUETIAPINE FUMARATE 50 MG PO TABS: 50.0000 mg | ORAL_TABLET | Freq: Every day | ORAL | Status: DC

## 2013-01-20 ENCOUNTER — Telehealth: Payer: Self-pay | Admitting: Family Medicine

## 2013-01-20 NOTE — Telephone Encounter (Signed)
Patient's husband calls with concerns about wife's memory loss. Would like to speak to the nurse about concerns.

## 2013-01-20 NOTE — Telephone Encounter (Signed)
Husband reports memory loss for 11 years. He wonders if patient has ever been checked for herpes encephalopathy. Advised can schedule patient an appointment to come in to discuss with MD.  Appointment scheduled 03/04 with PCP.

## 2013-01-27 ENCOUNTER — Ambulatory Visit: Payer: Medicare Other | Admitting: Family Medicine

## 2013-02-02 ENCOUNTER — Encounter: Payer: Self-pay | Admitting: Family Medicine

## 2013-02-02 ENCOUNTER — Ambulatory Visit (INDEPENDENT_AMBULATORY_CARE_PROVIDER_SITE_OTHER): Payer: Medicare Other | Admitting: Family Medicine

## 2013-02-02 ENCOUNTER — Telehealth: Payer: Self-pay | Admitting: *Deleted

## 2013-02-02 VITALS — BP 124/73 | HR 67 | Temp 98.4°F | Ht 64.0 in | Wt 145.0 lb

## 2013-02-02 DIAGNOSIS — F070 Personality change due to known physiological condition: Secondary | ICD-10-CM

## 2013-02-02 DIAGNOSIS — R413 Other amnesia: Secondary | ICD-10-CM

## 2013-02-02 DIAGNOSIS — E039 Hypothyroidism, unspecified: Secondary | ICD-10-CM

## 2013-02-02 DIAGNOSIS — F0390 Unspecified dementia without behavioral disturbance: Secondary | ICD-10-CM

## 2013-02-02 DIAGNOSIS — R6889 Other general symptoms and signs: Secondary | ICD-10-CM

## 2013-02-02 MED ORDER — DEXTROMETHORPHAN-QUINIDINE 20-10 MG PO CAPS: 1.0000 | ORAL_CAPSULE | Freq: Two times a day (BID) | ORAL | Status: DC

## 2013-02-02 NOTE — Assessment & Plan Note (Addendum)
A: stable.  P: refilled nuedexta for one month until patient can get f/u with psych Request patient have records sent from psych to me, apparently B12 level was checked there.

## 2013-02-02 NOTE — Telephone Encounter (Signed)
PA required for Nuedexa. Form placed in MD box.

## 2013-02-02 NOTE — Patient Instructions (Addendum)
Thank you for coming in today.   I sent a a refill for Nuedexta to your pharmacy. Printed it as well just in case you need a hard copy.   I will call with TSH results.  Please continue to try to reach Dr. Jeannine Kitten for follow up. Please have his office fax lab results and visit notes to me.   Dr. Armen Pickup

## 2013-02-02 NOTE — Progress Notes (Signed)
Subjective:     Patient ID: [name], female   DOB: 12-24-1950, 62 y.o.   MRN: [id number]  HPI 62 yo F presents with her husband for f/u visit to discuss the following:  1. Memory: has significant history of acute onset memory loss, seizure disorder, anxiety, depression and hypothyroidism. Still waiting to get plugged into [facility]. In the meantime she has been seizure free for the past year. She is working with a psychiatrist who started her on nuedexta and vayacog. She has been unable to schedule f/u with her psychiatrist and is now out of both. The nuedexta did appear to improve emotional lability. Her husband wonders if her acute memory loss could be caused by herpes encephalitis, I inform him that this is unlikely.   2. Hypothyroidism: compliant with synthroid. No weight changes. No GI or CV symptoms.   Review of Systems As per HPI     Objective:   Physical Exam BP 124/73  Pulse 67  Temp(Src) 98.4 F (36.9 C) (Oral)  Ht 5\' 4"  (1.626 m)  Wt 145 lb (65.772 kg)  BMI 24.88 kg/m2 General appearance: alert, cooperative and no distress     Assessment and Plan

## 2013-02-02 NOTE — Assessment & Plan Note (Signed)
Check TSH today

## 2013-02-03 LAB — TSH: TSH: 0.256 u[IU]/mL — ABNORMAL LOW (ref 0.350–4.500)

## 2013-02-04 MED ORDER — LEVOTHYROXINE SODIUM 75 MCG PO TABS: 75.0000 ug | ORAL_TABLET | Freq: Every day | ORAL | Status: DC

## 2013-02-04 NOTE — Telephone Encounter (Signed)
[facility] needed to fill out PA form. Patient must sign a release in order to get records.  Release should be faxed to [phone], attn Lilyan Punt.  I also spoke to the scheduler who reports that Dr. Otelia Santee does have open appts and she will call [name] at home to schedule a f/u appt since the patient reported difficulty scheduling f/u.   Attempted to call patient. With TSH results, med change and instructions regarding nuedexta and psychiatric f/u. Home number listed is no longer in service.  Unable to leave VM on her husband's mobile.   Will send letter.  Will not fill out PA form. Patient should have Nuedexa refilled by her psychiatrist upon f/u.   [facility] and left VM cancelling nuedexta rx for now.

## 2013-03-30 ENCOUNTER — Ambulatory Visit (INDEPENDENT_AMBULATORY_CARE_PROVIDER_SITE_OTHER): Payer: Medicare Other | Admitting: Family Medicine

## 2013-03-30 ENCOUNTER — Encounter: Payer: Self-pay | Admitting: Family Medicine

## 2013-03-30 VITALS — BP 103/65 | HR 81 | Temp 98.6°F | Ht 64.0 in | Wt 143.0 lb

## 2013-03-30 DIAGNOSIS — E039 Hypothyroidism, unspecified: Secondary | ICD-10-CM

## 2013-03-30 DIAGNOSIS — F070 Personality change due to known physiological condition: Secondary | ICD-10-CM

## 2013-03-30 LAB — TSH: TSH: 0.239 u[IU]/mL — ABNORMAL LOW (ref 0.350–4.500)

## 2013-03-30 MED ORDER — CITALOPRAM HYDROBROMIDE 20 MG PO TABS: 20.0000 mg | ORAL_TABLET | Freq: Every day | ORAL | Status: DC

## 2013-03-30 NOTE — Assessment & Plan Note (Signed)
Recheck TSH. Dose decreased 8 weeks ago.

## 2013-03-30 NOTE — Progress Notes (Signed)
Subjective:     Patient ID: [name], female   DOB: 1950/12/25, 62 y.o.   MRN: [id number]  HPI 62 yo F presents with her daughters to discuss the following:  1. Memory loss: x 11 years. no known cause. Recently with worsening depression. Patient no longer driving and going out. She is followed by psychiatry. Her daughters are concerned. She is also concerned and wants to get better. No focal neuro deficits.  2. Anxiety: increased attacks. Associated with feeling hot, palpitations, feeling of doom. Occur multiple times daily.   3. Hypothyroidism: decreased TSH. synthroid dose reduced.   Meds: reviewed with patient and daughter.   Review of Systems As per HPI    Objective:   Physical Exam BP 103/65  Pulse 81  Temp(Src) 98.6 F (37 C) (Oral)  Ht 5\' 4"  (1.626 m)  Wt 143 lb (64.864 kg)  BMI 24.53 kg/m2 General appearance: alert, cooperative and no distress Neurologic: Grossly normal PHQ-9: score 15,  0-3, 7 and 9. 3-1,2,4,5,8.  Moderately severe depression.  See doc flowsheet.   MMSE: doc flowsheet. Score 27/30.    Home bound status:currently homebound due to decline in depression. Now moderately severe.      Assessment and Plan:

## 2013-03-30 NOTE — Patient Instructions (Addendum)
Thank you for coming in today. Your mini mental status exam is normal. You are screening positive for depression and your decrease activity is a sign of worsening depression. Please increase celexa to 20 mg daily.   I have placed a referral to neurology.   I will call with TSH results and if synthroid needs to be adjusted again.   Dr. Armen Pickup

## 2013-03-30 NOTE — Assessment & Plan Note (Addendum)
A: worsening depression. Unchanged memory P: Increase celexa to 20 mg daily.  Provided list of therapist Referral to neuro Home health RN to assess home medication compliance.

## 2013-03-30 NOTE — Progress Notes (Deleted)
1400

## 2013-03-31 ENCOUNTER — Telehealth: Payer: Self-pay | Admitting: Family Medicine

## 2013-03-31 DIAGNOSIS — E039 Hypothyroidism, unspecified: Secondary | ICD-10-CM

## 2013-03-31 MED ORDER — LEVOTHYROXINE SODIUM 50 MCG PO TABS: 50.0000 ug | ORAL_TABLET | Freq: Every day | ORAL | Status: DC

## 2013-03-31 NOTE — Telephone Encounter (Signed)
Called patient  Changed TSH dose. New rx sent. Will need to have TSH rechecked in 8 weeks.

## 2013-04-08 ENCOUNTER — Telehealth: Payer: Self-pay | Admitting: Family Medicine

## 2013-04-08 NOTE — Telephone Encounter (Signed)
Pt didn't want her to come out today - but will try to go out tomorrow FYI

## 2013-04-10 ENCOUNTER — Telehealth: Payer: Self-pay | Admitting: *Deleted

## 2013-04-10 NOTE — Telephone Encounter (Signed)
Message left on office physician/pharmacy line from [facility].  Patient had orders for home health care from [facility].  Patient stated she doesn't want any more home care and will be discharged due to refusal of services.  Note routed to Dr. Armen Pickup.  Gaylene Brooks, RN

## 2013-04-10 NOTE — Telephone Encounter (Signed)
Noted  

## 2013-04-21 ENCOUNTER — Telehealth: Payer: Self-pay | Admitting: Family Medicine

## 2013-04-21 MED ORDER — CLONAZEPAM 0.5 MG PO TABS: 0.5000 mg | ORAL_TABLET | Freq: Two times a day (BID) | ORAL | Status: DC | PRN

## 2013-04-21 NOTE — Telephone Encounter (Signed)
White team: Please call in rx for clonopin #60. Received refill request for pt chronic med with anxiety and panic attacks. Will wait on extending additional refills until Dr. Armen Pickup can approve.

## 2013-04-21 NOTE — Telephone Encounter (Signed)
Refill on Klonopin called in per MD request.  See note below.  Forrest Jaroszewski, Darlyne Russian, CMA

## 2013-04-22 ENCOUNTER — Other Ambulatory Visit: Payer: Self-pay | Admitting: *Deleted

## 2013-04-22 NOTE — Telephone Encounter (Signed)
Requested Prescriptions   Pending Prescriptions Disp Refills  . clonazePAM (KLONOPIN) 0.5 MG tablet 60 tablet 0    Sig: Take 1 tablet (0.5 mg total) by mouth 2 (two) times daily as needed for anxiety.

## 2013-04-23 NOTE — Telephone Encounter (Signed)
This was already called in two days ago (see phone note).

## 2013-04-24 MED ORDER — CLONAZEPAM 0.5 MG PO TABS: 0.5000 mg | ORAL_TABLET | Freq: Two times a day (BID) | ORAL | Status: DC | PRN

## 2013-04-28 ENCOUNTER — Telehealth: Payer: Self-pay | Admitting: Family Medicine

## 2013-04-28 NOTE — Telephone Encounter (Signed)
I spoke to the patient's husband about neurology f/u. Apparently patient has an outstanding balance a [facility] neurology. Until this after the dose is addressed the patient cannot schedule of followup appointment. The patient's husband said that he would address the balance. I will leave referral to neurology open for now.

## 2013-05-15 ENCOUNTER — Other Ambulatory Visit: Payer: Self-pay | Admitting: Family Medicine

## 2013-05-15 MED ORDER — CLONAZEPAM 0.5 MG PO TABS: 0.5000 mg | ORAL_TABLET | Freq: Two times a day (BID) | ORAL | Status: DC | PRN

## 2013-05-15 NOTE — Telephone Encounter (Signed)
Procedure prescription refill request for the patient's Klonopin. I called in a refill.

## 2013-06-15 ENCOUNTER — Other Ambulatory Visit: Payer: Self-pay | Admitting: Family Medicine

## 2013-06-15 NOTE — Telephone Encounter (Signed)
Pt is requesting a refill on Fougero be sent to her pharmacy on file. JW

## 2013-06-18 NOTE — Telephone Encounter (Signed)
Originally prescribed back in 2012. Not sure why pt would need this unless rash has returned Long term use of this is not safe as it can thin the skin If using intermittently for short period of time then OK Either need more information or to see pt prior to refilling

## 2013-06-18 NOTE — Telephone Encounter (Signed)
Pt notified of refill of Kenalog ointment sent in to [facility].  Chey Cho, Darlyne Russian, CMA

## 2013-06-25 ENCOUNTER — Other Ambulatory Visit: Payer: Self-pay | Admitting: *Deleted

## 2013-06-29 MED ORDER — LEVOTHYROXINE SODIUM 50 MCG PO TABS: 50.0000 ug | ORAL_TABLET | Freq: Every day | ORAL | Status: DC

## 2013-07-30 ENCOUNTER — Other Ambulatory Visit: Payer: Self-pay | Admitting: *Deleted

## 2013-08-07 MED ORDER — CITALOPRAM HYDROBROMIDE 20 MG PO TABS: 20.0000 mg | ORAL_TABLET | Freq: Every day | ORAL | Status: DC

## 2013-10-05 ENCOUNTER — Encounter: Payer: Self-pay | Admitting: Family Medicine

## 2013-10-05 ENCOUNTER — Ambulatory Visit (INDEPENDENT_AMBULATORY_CARE_PROVIDER_SITE_OTHER): Payer: Medicare Other | Admitting: Family Medicine

## 2013-10-05 VITALS — BP 144/75 | HR 63 | Temp 98.5°F | Wt 147.0 lb

## 2013-10-05 DIAGNOSIS — R739 Hyperglycemia, unspecified: Secondary | ICD-10-CM | POA: Insufficient documentation

## 2013-10-05 DIAGNOSIS — R7309 Other abnormal glucose: Secondary | ICD-10-CM

## 2013-10-05 DIAGNOSIS — Z0000 Encounter for general adult medical examination without abnormal findings: Secondary | ICD-10-CM

## 2013-10-05 DIAGNOSIS — Z23 Encounter for immunization: Secondary | ICD-10-CM

## 2013-10-05 DIAGNOSIS — B351 Tinea unguium: Secondary | ICD-10-CM | POA: Insufficient documentation

## 2013-10-05 DIAGNOSIS — E785 Hyperlipidemia, unspecified: Secondary | ICD-10-CM

## 2013-10-05 DIAGNOSIS — R413 Other amnesia: Secondary | ICD-10-CM | POA: Insufficient documentation

## 2013-10-05 DIAGNOSIS — E039 Hypothyroidism, unspecified: Secondary | ICD-10-CM

## 2013-10-05 LAB — COMPREHENSIVE METABOLIC PANEL
ALT: 10 U/L (ref 0–35)
AST: 16 U/L (ref 0–37)
Albumin: 4 g/dL (ref 3.5–5.2)
Alkaline Phosphatase: 65 U/L (ref 39–117)
BUN: 9 mg/dL (ref 6–23)
CO2: 28 mEq/L (ref 19–32)
Calcium: 8.8 mg/dL (ref 8.4–10.5)
Chloride: 100 mEq/L (ref 96–112)
Creat: 0.65 mg/dL (ref 0.50–1.10)
Glucose, Bld: 89 mg/dL (ref 70–99)
Potassium: 4 mEq/L (ref 3.5–5.3)
Sodium: 137 mEq/L (ref 135–145)
Total Bilirubin: 0.5 mg/dL (ref 0.3–1.2)
Total Protein: 7.3 g/dL (ref 6.0–8.3)

## 2013-10-05 LAB — LIPID PANEL
Cholesterol: 307 mg/dL — ABNORMAL HIGH (ref 0–200)
HDL: 79 mg/dL (ref >39)
LDL Cholesterol: 203 mg/dL — ABNORMAL HIGH (ref 0–99)
Total CHOL/HDL Ratio: 3.9 Ratio
Triglycerides: 127 mg/dL (ref <150)
VLDL: 25 mg/dL (ref 0–40)

## 2013-10-05 LAB — POCT GLYCOSYLATED HEMOGLOBIN (HGB A1C): Hemoglobin A1C: 6.2

## 2013-10-05 NOTE — Patient Instructions (Signed)
You are doing very well I will let you know if we well start a medicine for your nail infection after your labs come back Please come back to see me in a few weeks just for a memory evaluation.  Please continue all of your current medicaitons as prescribed.

## 2013-10-05 NOTE — Assessment & Plan Note (Addendum)
CMET today Start lamisil for 16 wks if LFTs nml  Lab Results  Component Value Date   ALT 10 10/05/2013   AST 16 10/05/2013   ALKPHOS 65 10/05/2013   BILITOT 0.5 10/05/2013   Starting Lamisil.

## 2013-10-05 NOTE — Progress Notes (Signed)
[name] is a 62 y.o. female who presents to [facility] today for routine checkup  Fingernails. Turning colors. Thickening. Present for years. OTC lamisil topical oint w/o benefit. Itchy  PAP: Never had abnormal pap. Last pap 3 years ago. No sex for several years. No vaginal discharge. Would like to not have this done.  Pnaumonia shot: has not had in many years.   HTN: denies palpitations, CP, SOB, syncope. Takes HCTZ as prescribed.   HUSBAND ADMINISTERS MEDICATIONS.   SZR: No sezure for several years.    The following portions of the patient's history were reviewed and updated as appropriate: allergies, current medications, past medical history, family and social history, and problem list.  Patient is a nonsmoker.   Past Medical History  Diagnosis Date  . Memory loss of unknown cause 03/04/2002    suspect depression with psychotic features. patient hospitalized and work-up negative.     ROS as above otherwise neg.    Medications reviewed. Current Outpatient Prescriptions  Medication Sig Dispense Refill  . citalopram (CELEXA) 20 MG tablet Take 1 tablet (20 mg total) by mouth daily.  30 tablet  3  . clonazePAM (KLONOPIN) 0.5 MG tablet Take 1 tablet (0.5 mg total) by mouth 2 (two) times daily as needed for anxiety.  60 tablet  5  . EXELON 4.6 MG/24HR       . hydrochlorothiazide (HYDRODIURIL) 25 MG tablet Take 1 tablet (25 mg total) by mouth daily.  90 tablet  2  . hydrOXYzine (ATARAX/VISTARIL) 25 MG tablet Take 1 tablet (25 mg total) by mouth 2 (two) times daily as needed for itching.  60 tablet  2  . levothyroxine (SYNTHROID, LEVOTHROID) 50 MCG tablet Take 1 tablet (50 mcg total) by mouth daily. For hypothyroidism  30 tablet  2  . Memantine HCl ER (NAMENDA XR) 28 MG CP24 Take by mouth.      . QUEtiapine (SEROQUEL) 50 MG tablet Take 1 tablet (50 mg total) by mouth at bedtime.  90 tablet  0  . SUMAtriptan (IMITREX) 100 MG tablet Take 1 tablet (100 mg total) by mouth as directed.  10  tablet  3  . triamcinolone ointment (KENALOG) 0.1 % APPLY TOPICALLY TO AFFECTED AREA TWICE DAILY  30 g  1   No current facility-administered medications for this visit.    Exam: BP 144/75  Pulse 63  Temp(Src) 98.5 F (36.9 C) (Oral)  Wt 147 lb (66.679 kg) Gen: Well NAD HEENT: EOMI,  MMM Lungs: CTABL Nl WOB Heart: RRR no II/VI systolic murmur. Abd: NABS, NT, ND Exts: Non edematous BL  LE, warm and well perfused. Multiple fingers of L and R hands w/ thickened, dark, and flaky fingernails.    No results found for this or any previous visit (from the past 72 hour(s)).

## 2013-10-06 ENCOUNTER — Telehealth: Payer: Self-pay | Admitting: *Deleted

## 2013-10-06 LAB — TSH: TSH: 3.246 u[IU]/mL (ref 0.350–4.500)

## 2013-10-06 MED ORDER — METFORMIN HCL 500 MG PO TABS: ORAL_TABLET | ORAL | Status: DC

## 2013-10-06 MED ORDER — LEVOTHYROXINE SODIUM 50 MCG PO TABS: 50.0000 ug | ORAL_TABLET | Freq: Every day | ORAL | Status: DC

## 2013-10-06 MED ORDER — HYDROCHLOROTHIAZIDE 25 MG PO TABS: 25.0000 mg | ORAL_TABLET | Freq: Every day | ORAL | Status: DC

## 2013-10-06 MED ORDER — ATORVASTATIN CALCIUM 40 MG PO TABS: 40.0000 mg | ORAL_TABLET | Freq: Every day | ORAL | Status: DC

## 2013-10-06 MED ORDER — TERBINAFINE HCL 250 MG PO TABS: 250.0000 mg | ORAL_TABLET | Freq: Every day | ORAL | Status: DC

## 2013-10-06 NOTE — Telephone Encounter (Signed)
Message copied by Henri Medal on Tue Oct 06, 2013  9:34 AM ------      Message from: [facility], DAVID J      Created: Tue Oct 06, 2013  7:35 AM       Please call and inform pt of elevated A1c to 6.2 or pre-DM and the need to start Metformin ( this may cause upset stomach). Take as prescribed or stop if needed. Please also infrom that hepatic fxn nml and can start Lamisil for fingernails. This is a once a day medication to take for 16 weeks. Pt to return in 4 wks  ------

## 2013-10-06 NOTE — Assessment & Plan Note (Signed)
Lab Results  Component Value Date   CHOL 307* 10/05/2013   HDL 79 10/05/2013   LDLCALC 203* 10/05/2013   LDLDIRECT 217* 07/04/2012   TRIG 127 10/05/2013   CHOLHDL 3.9 10/05/2013   Lab Results  Component Value Date   ALT 10 10/05/2013   AST 16 10/05/2013   ALKPHOS 65 10/05/2013   BILITOT 0.5 10/05/2013   ASCVD 10year risk of 13% Starting Lipitor 40

## 2013-10-06 NOTE — Assessment & Plan Note (Signed)
Lab Results  Component Value Date   HGBA1C 6.2 10/05/2013   Pt in pre-DM range Starting Metformin

## 2013-10-06 NOTE — Telephone Encounter (Signed)
Pt needs refills on her bp medication and her synthroid.  Ok per Dr. Konrad Dolores to send over. Jayme Mednick,CMA

## 2013-10-06 NOTE — Addendum Note (Signed)
Addended by: Ozella Rocks on: 10/06/2013 07:51 AM   Modules accepted: Orders, Level of Service

## 2013-10-06 NOTE — Assessment & Plan Note (Addendum)
Pt deferring PAP at this time, adn I think this is reasonable given benign hx and age close to recommended 7.  Pneumovax and flu today

## 2013-10-26 ENCOUNTER — Ambulatory Visit (INDEPENDENT_AMBULATORY_CARE_PROVIDER_SITE_OTHER): Payer: Medicare Other | Admitting: Family Medicine

## 2013-10-26 VITALS — BP 120/73 | HR 67 | Temp 98.0°F | Ht 64.0 in | Wt 147.0 lb

## 2013-10-26 DIAGNOSIS — I10 Essential (primary) hypertension: Secondary | ICD-10-CM

## 2013-10-26 DIAGNOSIS — B351 Tinea unguium: Secondary | ICD-10-CM

## 2013-10-26 DIAGNOSIS — E039 Hypothyroidism, unspecified: Secondary | ICD-10-CM

## 2013-10-26 DIAGNOSIS — F411 Generalized anxiety disorder: Secondary | ICD-10-CM

## 2013-10-26 MED ORDER — CLONAZEPAM 0.5 MG PO TABS: 0.5000 mg | ORAL_TABLET | Freq: Two times a day (BID) | ORAL | Status: DC | PRN

## 2013-10-26 MED ORDER — MEMANTINE HCL ER 28 MG PO CP24: 28.0000 mg | ORAL_CAPSULE | Freq: Every day | ORAL | Status: DC

## 2013-10-26 MED ORDER — SUMATRIPTAN SUCCINATE 100 MG PO TABS: 100.0000 mg | ORAL_TABLET | ORAL | Status: DC

## 2013-10-26 MED ORDER — CITALOPRAM HYDROBROMIDE 40 MG PO TABS: 20.0000 mg | ORAL_TABLET | Freq: Every day | ORAL | Status: DC

## 2013-10-26 MED ORDER — HYDROXYZINE HCL 25 MG PO TABS: 25.0000 mg | ORAL_TABLET | Freq: Two times a day (BID) | ORAL | Status: DC | PRN

## 2013-10-26 MED ORDER — HYDROCHLOROTHIAZIDE 25 MG PO TABS: 25.0000 mg | ORAL_TABLET | Freq: Every day | ORAL | Status: DC

## 2013-10-26 MED ORDER — LEVOTHYROXINE SODIUM 50 MCG PO TABS: 50.0000 ug | ORAL_TABLET | Freq: Every day | ORAL | Status: DC

## 2013-10-26 MED ORDER — QUETIAPINE FUMARATE 50 MG PO TABS: 50.0000 mg | ORAL_TABLET | Freq: Every day | ORAL | Status: DC

## 2013-10-26 NOTE — Assessment & Plan Note (Signed)
Tolerating Lamisil No visible change in nails at this time CMET after new year

## 2013-10-26 NOTE — Assessment & Plan Note (Signed)
Well-controlled.  Continue current regimen. 

## 2013-10-26 NOTE — Progress Notes (Signed)
[name] is a 62 y.o. female who presents to [facility] today for f/u  HTN: Denies CP, SOB, HA.   DM: started metformin w/o GI upset. No   Onychomycosis: on lamisil for 3 weeks.   ANxiety: has daily anxiety attacks. Using celexa and klonopin. Klonopin daily. Uses Atarax daily.   The following portions of the patient's history were reviewed and updated as appropriate: allergies, current medications, past medical history, family and social history, and problem list.  Patient is a nonsmoker.  Past Medical History  Diagnosis Date  . Memory loss of unknown cause 03/04/2002    suspect depression with psychotic features. patient hospitalized and work-up negative.     ROS as above otherwise neg.    Medications reviewed. Current Outpatient Prescriptions  Medication Sig Dispense Refill  . atorvastatin (LIPITOR) 40 MG tablet Take 1 tablet (40 mg total) by mouth daily.  30 tablet  5  . citalopram (CELEXA) 20 MG tablet Take 1 tablet (20 mg total) by mouth daily.  30 tablet  3  . clonazePAM (KLONOPIN) 0.5 MG tablet Take 1 tablet (0.5 mg total) by mouth 2 (two) times daily as needed for anxiety.  60 tablet  5  . EXELON 4.6 MG/24HR       . hydrochlorothiazide (HYDRODIURIL) 25 MG tablet Take 1 tablet (25 mg total) by mouth daily.  90 tablet  2  . hydrOXYzine (ATARAX/VISTARIL) 25 MG tablet Take 1 tablet (25 mg total) by mouth 2 (two) times daily as needed for itching.  60 tablet  2  . levothyroxine (SYNTHROID, LEVOTHROID) 50 MCG tablet Take 1 tablet (50 mcg total) by mouth daily. For hypothyroidism  30 tablet  2  . Memantine HCl ER (NAMENDA XR) 28 MG CP24 Take by mouth.      . metFORMIN (GLUCOPHAGE) 500 MG tablet Take 1 tablet by mouth daily for 2 weeks then increase to 1 tablet by mouth twice a day.  60 tablet  6  . QUEtiapine (SEROQUEL) 50 MG tablet Take 1 tablet (50 mg total) by mouth at bedtime.  90 tablet  0  . SUMAtriptan (IMITREX) 100 MG tablet Take 1 tablet (100 mg total) by mouth as directed.   10 tablet  3  . terbinafine (LAMISIL) 250 MG tablet Take 1 tablet (250 mg total) by mouth daily.  112 tablet  0  . triamcinolone ointment (KENALOG) 0.1 % APPLY TOPICALLY TO AFFECTED AREA TWICE DAILY  30 g  1   No current facility-administered medications for this visit.    Exam:  BP 120/73  Pulse 67  Temp(Src) 98 F (36.7 C) (Oral)  Ht 5\' 4"  (1.626 m)  Wt 147 lb (66.679 kg)  BMI 25.22 kg/m2 Gen: Well NAD HEENT: EOMI,  MMM   No results found for this or any previous visit (from the past 72 hour(s)).

## 2013-10-26 NOTE — Assessment & Plan Note (Signed)
Increase celexa to 40mg  Pt counseled to decrease klonopin and atarax

## 2013-10-26 NOTE — Patient Instructions (Signed)
Thank you for coming in today I think you are doing well overall. Please continue to take all of your medications as prescribed Please try to cut back on the Klonopin, and atarax Please increase your celexa to 40mg  daily Please come back after the new year for blood work and a check up. Have a wonderful day.

## 2013-10-26 NOTE — Assessment & Plan Note (Signed)
TSH nmnl Cont current regimen

## 2013-12-02 ENCOUNTER — Encounter: Payer: Self-pay | Admitting: Family Medicine

## 2013-12-02 ENCOUNTER — Ambulatory Visit (INDEPENDENT_AMBULATORY_CARE_PROVIDER_SITE_OTHER): Payer: Medicare Other | Admitting: Family Medicine

## 2013-12-02 VITALS — BP 130/77 | HR 67 | Temp 98.5°F | Ht 64.0 in | Wt 140.0 lb

## 2013-12-02 DIAGNOSIS — F070 Personality change due to known physiological condition: Secondary | ICD-10-CM

## 2013-12-02 DIAGNOSIS — F411 Generalized anxiety disorder: Secondary | ICD-10-CM

## 2013-12-02 MED ORDER — CITALOPRAM HYDROBROMIDE 20 MG PO TABS: 20.0000 mg | ORAL_TABLET | Freq: Every day | ORAL | Status: DC

## 2013-12-02 NOTE — Progress Notes (Signed)
[name] is a 63 y.o. female who presents to [facility] today for memory concerns and anxiety  Memory: pt w/ 2 daughters who express concerns over continued memory loss/inability to create new memories. Pt w/ extensive workup in the past at several different providers (neuro and psych) w/o much by way of solid dx and treatment.   Anxiety: no change w/ celexa increase. Numerous panic attacks daily   The following portions of the patient's history were reviewed and updated as appropriate: allergies, current medications, past medical history, family and social history, and problem list.   Past Medical History  Diagnosis Date  . Memory loss of unknown cause 03/04/2002    suspect depression with psychotic features. patient hospitalized and work-up negative.     ROS as above otherwise neg.    Medications reviewed. Current Outpatient Prescriptions  Medication Sig Dispense Refill  . atorvastatin (LIPITOR) 40 MG tablet Take 1 tablet (40 mg total) by mouth daily.  30 tablet  5  . citalopram (CELEXA) 20 MG tablet Take 1 tablet (20 mg total) by mouth daily.  90 tablet  3  . clonazePAM (KLONOPIN) 0.5 MG tablet Take 1 tablet (0.5 mg total) by mouth 2 (two) times daily as needed for anxiety.  60 tablet  5  . hydrochlorothiazide (HYDRODIURIL) 25 MG tablet Take 1 tablet (25 mg total) by mouth daily.  90 tablet  3  . hydrOXYzine (ATARAX/VISTARIL) 25 MG tablet Take 1 tablet (25 mg total) by mouth 2 (two) times daily as needed for itching.  180 tablet  3  . levothyroxine (SYNTHROID, LEVOTHROID) 50 MCG tablet Take 1 tablet (50 mcg total) by mouth daily. For hypothyroidism  90 tablet  3  . Memantine HCl ER (NAMENDA XR) 28 MG CP24 Take 28 mg by mouth daily.  90 capsule  3  . metFORMIN (GLUCOPHAGE) 500 MG tablet Take 1 tablet by mouth daily for 2 weeks then increase to 1 tablet by mouth twice a day.  60 tablet  6  . QUEtiapine (SEROQUEL) 50 MG tablet Take 1 tablet (50 mg total) by mouth at bedtime.  90 tablet  3   . SUMAtriptan (IMITREX) 100 MG tablet Take 1 tablet (100 mg total) by mouth as directed.  10 tablet  3  . terbinafine (LAMISIL) 250 MG tablet Take 1 tablet (250 mg total) by mouth daily.  112 tablet  0  . triamcinolone ointment (KENALOG) 0.1 % APPLY TOPICALLY TO AFFECTED AREA TWICE DAILY  30 g  1   No current facility-administered medications for this visit.    Exam:  BP 130/77  Pulse 67  Temp(Src) 98.5 F (36.9 C) (Oral)  Ht 5\' 4"  (1.626 m)  Wt 140 lb (63.504 kg)  BMI 24.02 kg/m2 Gen: Well NAD HEENT: EOMI,  MMM   No results found for this or any previous visit (from the past 49 hour(s)).  A/P (as seen in Problem list)  MEMORY LOSS Very difficult issue to treat Expect some involvement of hippocampus Wkly visit for several weeks here I will try to locate memory/neuro specialist in this area to refer pt to    [place] NOS Significant par tof pts daily living Some childhood traumatic events which come up regularly Family to keep track ot symptoms triggers.  Likely component due to memory/brain issue Celexa 40mg  w/o benefit.  Decrease to 20mg  adn attempt wean.     Spent greater than 25 min in direct pt care and counseling

## 2013-12-02 NOTE — Assessment & Plan Note (Signed)
Very difficult issue to treat Expect some involvement of hippocampus Wkly visit for several weeks here I will try to locate memory/neuro specialist in this area to refer pt to

## 2013-12-02 NOTE — Assessment & Plan Note (Signed)
Significant par tof pts daily living Some childhood traumatic events which come up regularly Family to keep track ot symptoms triggers.  Likely component due to memory/brain issue Celexa 40mg  w/o benefit.  Decrease to 20mg  adn attempt wean.

## 2013-12-02 NOTE — Patient Instructions (Addendum)
Thank you for coming in today with your daughters As discussed, please decrease your dose of celexa to 20mg  daily.  Please keep detailed notes on your symptoms and triggers of yoru panic attacks  [name] needs to have weekly follow-ups for several weeks I will try to find a memory/neuro specialty clinic to investigate her memory condition further.

## 2013-12-03 ENCOUNTER — Telehealth: Payer: Self-pay | Admitting: Family Medicine

## 2013-12-03 NOTE — Telephone Encounter (Signed)
Med recs from [facility] to be faxed to our office and put to my attention  Linna Darner, MD Family Medicine PGY-3 12/03/2013, 1:18 PM

## 2013-12-03 NOTE — Telephone Encounter (Signed)
Spoke to staff members about Pt case Nursing staff to discuss case w/ Dr. Krista Blue who initially saw and treated pt back in 2003.  Dr Krista Blue to call me back to discuss case and possible referral to specialty neurology/memory clinic.  Appreciate assistance by Neuro team  Linna Darner, MD Family Medicine PGY-3 12/03/2013, 1:17 PM

## 2013-12-10 ENCOUNTER — Ambulatory Visit (INDEPENDENT_AMBULATORY_CARE_PROVIDER_SITE_OTHER): Payer: Medicare Other | Admitting: Family Medicine

## 2013-12-10 ENCOUNTER — Encounter: Payer: Self-pay | Admitting: Family Medicine

## 2013-12-10 VITALS — BP 145/72 | HR 56 | Temp 98.4°F | Wt 144.0 lb

## 2013-12-10 DIAGNOSIS — B351 Tinea unguium: Secondary | ICD-10-CM

## 2013-12-10 DIAGNOSIS — F070 Personality change due to known physiological condition: Secondary | ICD-10-CM

## 2013-12-10 DIAGNOSIS — F411 Generalized anxiety disorder: Secondary | ICD-10-CM

## 2013-12-10 MED ORDER — CLONAZEPAM 0.5 MG PO TABS: 0.2500 mg | ORAL_TABLET | Freq: Two times a day (BID) | ORAL | Status: DC | PRN

## 2013-12-10 NOTE — Assessment & Plan Note (Signed)
Pt w/ near 11 attacks a day, some lasting only a few seconds.  Not sure all realted to anxiety vs medication induced Continue current dose of celexa and vistaril PRN Cut klonopin in half Continue jopurnal keeping and plan to wean again next week

## 2013-12-10 NOTE — Assessment & Plan Note (Signed)
Reviewed [facility] records w/ pt Discussed going to [facility] memory clinic. Attempted to make appt but they needed additional information Pts husband to call to make appt (scheduling into August at this time)

## 2013-12-10 NOTE — Progress Notes (Signed)
[name] is a 63 y.o. female who presents to [facility] today for memory and anxiety:   Anxiety: no change in overall anxiety after decreasing celexa. 11-19 attacks a day. Many only last seconds per pt. Still on Klonopin 0.5mg  BID. Pt and husband journaling.  Memory: no change. Reviewed [facility] records w/ pt and husband. No improvement. No seizures, trauma.   The following portions of the patient's history were reviewed and updated as appropriate: allergies, current medications, past medical history, family and social history, and problem list.    Past Medical History  Diagnosis Date  . Memory loss of unknown cause 03/04/2002    suspect depression with psychotic features. patient hospitalized and work-up negative.    ROS as above otherwise neg.    Medications reviewed. Current Outpatient Prescriptions  Medication Sig Dispense Refill  . atorvastatin (LIPITOR) 40 MG tablet Take 1 tablet (40 mg total) by mouth daily.  30 tablet  5  . citalopram (CELEXA) 20 MG tablet Take 1 tablet (20 mg total) by mouth daily.  90 tablet  3  . clonazePAM (KLONOPIN) 0.5 MG tablet Take 0.5 tablets (0.25 mg total) by mouth 2 (two) times daily as needed for anxiety.  30 tablet  3  . hydrochlorothiazide (HYDRODIURIL) 25 MG tablet Take 1 tablet (25 mg total) by mouth daily.  90 tablet  3  . hydrOXYzine (ATARAX/VISTARIL) 25 MG tablet Take 1 tablet (25 mg total) by mouth 2 (two) times daily as needed for itching.  180 tablet  3  . levothyroxine (SYNTHROID, LEVOTHROID) 50 MCG tablet Take 1 tablet (50 mcg total) by mouth daily. For hypothyroidism  90 tablet  3  . Memantine HCl ER (NAMENDA XR) 28 MG CP24 Take 28 mg by mouth daily.  90 capsule  3  . metFORMIN (GLUCOPHAGE) 500 MG tablet Take 1 tablet by mouth daily for 2 weeks then increase to 1 tablet by mouth twice a day.  60 tablet  6  . QUEtiapine (SEROQUEL) 50 MG tablet Take 1 tablet (50 mg total) by mouth at bedtime.  90 tablet  3  . SUMAtriptan (IMITREX) 100  MG tablet Take 1 tablet (100 mg total) by mouth as directed.  10 tablet  3  . terbinafine (LAMISIL) 250 MG tablet Take 1 tablet (250 mg total) by mouth daily.  112 tablet  0  . triamcinolone ointment (KENALOG) 0.1 % APPLY TOPICALLY TO AFFECTED AREA TWICE DAILY  30 g  1   No current facility-administered medications for this visit.    Exam:  BP 145/72  Pulse 56  Temp(Src) 98.4 F (36.9 C) (Oral)  Wt 144 lb (65.318 kg) Gen: Well NAD HEENT: EOMI,  MMM MSK: fingernails w/ proximal clearing of onychomycosis  No results found for this or any previous visit (from the past 72 hour(s)).  A/P (as seen in Problem list)  ANXIETY STATE NOS Pt w/ near 11 attacks a day, some lasting only a few seconds.  Not sure all realted to anxiety vs medication induced Continue current dose of celexa and vistaril PRN Cut klonopin in half Continue jopurnal keeping and plan to wean again next week  [facility] records w/ pt Discussed going to [facility] memory clinic. Attempted to make appt but they needed additional information Pts husband to call to make appt (scheduling into August at this time)   Onychomycosis Improving Continue lamisil    -

## 2013-12-10 NOTE — Assessment & Plan Note (Signed)
Improving Continue lamisil  

## 2013-12-10 NOTE — Patient Instructions (Addendum)
You are doing great overall.  Please continue your journal keeping Please make sure to call the [facility] to schedule an appointment. Their number is [phone] Please try cutting back on the klonopin to a half tab twice a day Use the vistaril (or atarax) as needed for severe anxiety attacks Please keep the cymbalta dose the same Please come back in 1 week.

## 2013-12-17 ENCOUNTER — Encounter: Payer: Self-pay | Admitting: Family Medicine

## 2013-12-17 ENCOUNTER — Ambulatory Visit (INDEPENDENT_AMBULATORY_CARE_PROVIDER_SITE_OTHER): Payer: Medicare Other | Admitting: Family Medicine

## 2013-12-17 VITALS — BP 131/69 | HR 66 | Temp 98.5°F | Ht 64.0 in | Wt 140.0 lb

## 2013-12-17 DIAGNOSIS — I10 Essential (primary) hypertension: Secondary | ICD-10-CM

## 2013-12-17 DIAGNOSIS — F070 Personality change due to known physiological condition: Secondary | ICD-10-CM

## 2013-12-17 DIAGNOSIS — F411 Generalized anxiety disorder: Secondary | ICD-10-CM

## 2013-12-17 NOTE — Assessment & Plan Note (Signed)
Fewer overall attacks Pt to decrease Klonopin again to 0./25mg  Qday w/ vistaril for primary breakthrough May still use inc klonopin dose if needed Continue memory journal Pt to try to stay busy and become involved in service or work project Cont Seroquel F/u in 2 wks

## 2013-12-17 NOTE — Progress Notes (Signed)
[name] is a 63 y.o. female who presents to [facility] today for memory f/u  Anxiety attacks: 2 wks ago decreased celexa to 20mg  daily. Endoresed 11 attackes daily at last wks appt. 1 wk ago decreased klonopin dose to 0.25mg  BID PRN. First 3 days of decreased dose pt experienced 5,9, and 23 attacks attacks daily and are of the same intensity intense. Klonopin does help. Still on celexa. Only using atarax 2-3x mo for anxiety.   [facility] appt set for 07/12/14  Onychomycosis:    The following portions of the patient's history were reviewed and updated as appropriate: allergies, current medications, past medical history, family and social history, and problem list.  Patient is a nonsmoker.  Past Medical History  Diagnosis Date  . Memory loss of unknown cause 03/04/2002    suspect depression with psychotic features. patient hospitalized and work-up negative.     ROS as above otherwise neg.    Medications reviewed. Current Outpatient Prescriptions  Medication Sig Dispense Refill  . atorvastatin (LIPITOR) 40 MG tablet Take 1 tablet (40 mg total) by mouth daily.  30 tablet  5  . citalopram (CELEXA) 20 MG tablet Take 1 tablet (20 mg total) by mouth daily.  90 tablet  3  . clonazePAM (KLONOPIN) 0.5 MG tablet Take 0.5 tablets (0.25 mg total) by mouth 2 (two) times daily as needed for anxiety.  30 tablet  3  . hydrochlorothiazide (HYDRODIURIL) 25 MG tablet Take 1 tablet (25 mg total) by mouth daily.  90 tablet  3  . hydrOXYzine (ATARAX/VISTARIL) 25 MG tablet Take 1 tablet (25 mg total) by mouth 2 (two) times daily as needed for itching.  180 tablet  3  . levothyroxine (SYNTHROID, LEVOTHROID) 50 MCG tablet Take 1 tablet (50 mcg total) by mouth daily. For hypothyroidism  90 tablet  3  . metFORMIN (GLUCOPHAGE) 500 MG tablet Take 1 tablet by mouth daily for 2 weeks then increase to 1 tablet by mouth twice a day.  60 tablet  6  . QUEtiapine (SEROQUEL) 50 MG tablet Take 1 tablet (50 mg total) by  mouth at bedtime.  90 tablet  3  . SUMAtriptan (IMITREX) 100 MG tablet Take 1 tablet (100 mg total) by mouth as directed.  10 tablet  3  . terbinafine (LAMISIL) 250 MG tablet Take 1 tablet (250 mg total) by mouth daily.  112 tablet  0  . triamcinolone ointment (KENALOG) 0.1 % APPLY TOPICALLY TO AFFECTED AREA TWICE DAILY  30 g  1   No current facility-administered medications for this visit.    Exam: BP 131/69  Pulse 66  Temp(Src) 98.5 F (36.9 C) (Oral)  Ht 5\' 4"  (1.626 m)  Wt 140 lb (63.504 kg)  BMI 24.02 kg/m2 Gen: Well NAD HEENT: EOMI,  MMM   No results found for this or any previous visit (from the past 59 hour(s)).  A/P (as seen in Problem list)  HYPERTENSION, BENIGN SYSTEMIC Well controlled. No change  MEMORY LOSS No change in overall status Stopping Namenda today Restart in 2 wks if deteriorates   ANXIETY STATE NOS Fewer overall attacks Pt to decrease Klonopin again to 0./25mg  Qday w/ vistaril for primary breakthrough May still use inc klonopin dose if needed Continue memory journal Pt to try to stay busy and become involved in service or work project Cont Seroquel F/u in 2 wks    Spent > 25 min in direct pt care and coordination

## 2013-12-17 NOTE — Assessment & Plan Note (Signed)
No change in overall status Stopping Namenda today Restart in 2 wks if deteriorates

## 2013-12-17 NOTE — Patient Instructions (Signed)
You are doing very well. Please decrease your Klonopin to 1/2 a pill once a day. Use the vistaril as the primary for breakthrough. Please also stop the Namenda. Pay close attention to her memory. If it worsens then we will need to restart it.  Please come back in 2 weeks Try to get on the "cancellation" list at [facility].

## 2013-12-17 NOTE — Assessment & Plan Note (Signed)
Well controlled. No change. 

## 2014-01-01 ENCOUNTER — Encounter: Payer: Self-pay | Admitting: Family Medicine

## 2014-01-01 ENCOUNTER — Ambulatory Visit (INDEPENDENT_AMBULATORY_CARE_PROVIDER_SITE_OTHER): Payer: Medicare Other | Admitting: Family Medicine

## 2014-01-01 VITALS — BP 121/75 | HR 59 | Temp 98.3°F | Resp 16 | Wt 144.0 lb

## 2014-01-01 DIAGNOSIS — F329 Major depressive disorder, single episode, unspecified: Secondary | ICD-10-CM

## 2014-01-01 DIAGNOSIS — F411 Generalized anxiety disorder: Secondary | ICD-10-CM

## 2014-01-01 DIAGNOSIS — I10 Essential (primary) hypertension: Secondary | ICD-10-CM

## 2014-01-01 DIAGNOSIS — F3289 Other specified depressive episodes: Secondary | ICD-10-CM

## 2014-01-01 DIAGNOSIS — F070 Personality change due to known physiological condition: Secondary | ICD-10-CM

## 2014-01-01 NOTE — Assessment & Plan Note (Signed)
Much improved after decreasing medications, especially klonopin No further changes at this time. Family may try decreasing vs stopping (klonopin or vistaril) as appropriate Consider DC Seroquel at next appt

## 2014-01-01 NOTE — Progress Notes (Signed)
[name] is a 63 y.o. female who presents to [facility] today for memory  Appt attended w/ husband Izell Fincastle  Anxiety/memory: over last several days avg 0-5 attacks. Decreasing in intensity. Stopped namenda as discussed. Klonopin decreased to 0.25 BID. Vistaril once daily.   Still no recall of recent memory. Pt distant memory is improving.   MMSE: 28/30 (1 point off for year adn 3 min recall)   The following portions of the patient's history were reviewed and updated as appropriate: allergies, current medications, past medical history, family and social history, and problem list.  Patient is a nonsmoker  Past Medical History  Diagnosis Date  . Memory loss of unknown cause 03/04/2002    suspect depression with psychotic features. patient hospitalized and work-up negative.     ROS as above otherwise neg.    Medications reviewed. Current Outpatient Prescriptions  Medication Sig Dispense Refill  . atorvastatin (LIPITOR) 40 MG tablet Take 1 tablet (40 mg total) by mouth daily.  30 tablet  5  . citalopram (CELEXA) 20 MG tablet Take 1 tablet (20 mg total) by mouth daily.  90 tablet  3  . clonazePAM (KLONOPIN) 0.5 MG tablet Take 0.5 tablets (0.25 mg total) by mouth 2 (two) times daily as needed for anxiety.  30 tablet  3  . hydrochlorothiazide (HYDRODIURIL) 25 MG tablet Take 1 tablet (25 mg total) by mouth daily.  90 tablet  3  . hydrOXYzine (ATARAX/VISTARIL) 25 MG tablet Take 1 tablet (25 mg total) by mouth 2 (two) times daily as needed for itching.  180 tablet  3  . levothyroxine (SYNTHROID, LEVOTHROID) 50 MCG tablet Take 1 tablet (50 mcg total) by mouth daily. For hypothyroidism  90 tablet  3  . metFORMIN (GLUCOPHAGE) 500 MG tablet Take 1 tablet by mouth daily for 2 weeks then increase to 1 tablet by mouth twice a day.  60 tablet  6  . QUEtiapine (SEROQUEL) 50 MG tablet Take 1 tablet (50 mg total) by mouth at bedtime.  90 tablet  3  . SUMAtriptan (IMITREX) 100 MG tablet Take 1 tablet (100 mg  total) by mouth as directed.  10 tablet  3  . terbinafine (LAMISIL) 250 MG tablet Take 1 tablet (250 mg total) by mouth daily.  112 tablet  0  . triamcinolone ointment (KENALOG) 0.1 % APPLY TOPICALLY TO AFFECTED AREA TWICE DAILY  30 g  1   No current facility-administered medications for this visit.    Exam:  BP 121/75  Pulse 59  Temp(Src) 98.3 F (36.8 C) (Oral)  Resp 16  Wt 144 lb (65.318 kg)  SpO2 98% Gen: Well NAD HEENT: EOMI,  MMM   No results found for this or any previous visit (from the past 72 hour(s)).  A/P (as seen in Problem list)  No problem-specific assessment & plan notes found for this encounter.

## 2014-01-01 NOTE — Assessment & Plan Note (Signed)
No change. At goal  

## 2014-01-01 NOTE — Assessment & Plan Note (Signed)
Cont celexa at this time

## 2014-01-01 NOTE — Patient Instructions (Signed)
You are doing fantastic Please continue your current medical regimen as prescribed. You can consider decreasing the Klonopin and vistaril as needed. We will consider making more formal changes when you return  Please make an appointment to come back in 4 weeks Please continue your journal of your anxiety attacks Please continue to ttry to get in with [facility] early.

## 2014-01-01 NOTE — Assessment & Plan Note (Signed)
MMSE 28/30 today No sign of dementia Improved off namenda and better control of anxiety w/ non-pharmacologic interventions Awaiting [facility] appt in august

## 2014-01-28 ENCOUNTER — Encounter: Payer: Self-pay | Admitting: Family Medicine

## 2014-01-28 ENCOUNTER — Ambulatory Visit (INDEPENDENT_AMBULATORY_CARE_PROVIDER_SITE_OTHER): Payer: Medicare Other | Admitting: Family Medicine

## 2014-01-28 VITALS — BP 139/96 | HR 62 | Ht 64.0 in | Wt 144.0 lb

## 2014-01-28 DIAGNOSIS — R569 Unspecified convulsions: Secondary | ICD-10-CM

## 2014-01-28 DIAGNOSIS — F3289 Other specified depressive episodes: Secondary | ICD-10-CM

## 2014-01-28 DIAGNOSIS — F411 Generalized anxiety disorder: Secondary | ICD-10-CM

## 2014-01-28 DIAGNOSIS — F070 Personality change due to known physiological condition: Secondary | ICD-10-CM

## 2014-01-28 DIAGNOSIS — F329 Major depressive disorder, single episode, unspecified: Secondary | ICD-10-CM

## 2014-01-28 DIAGNOSIS — R42 Dizziness and giddiness: Secondary | ICD-10-CM | POA: Insufficient documentation

## 2014-01-28 MED ORDER — MECLIZINE HCL 12.5 MG PO TABS: 12.5000 mg | ORAL_TABLET | Freq: Three times a day (TID) | ORAL | Status: DC | PRN

## 2014-01-28 NOTE — Assessment & Plan Note (Signed)
Dix Hallpike negative Still likely BPPV Meclizine

## 2014-01-28 NOTE — Assessment & Plan Note (Signed)
Possible szr last night x2 H/o szr in past in 2012 Pt well cared for byhusband Pt to take additional Klonopin if another occurs and to call EMS if has >2 szr at night and lasting >39min.

## 2014-01-28 NOTE — Patient Instructions (Signed)
You are doing well overall Please take the meclizine for the dizziness and this will help you sleep If you have a seizure that lasts 10 min or longer or have more than 3 then please call EMS Please give one extra dose Klonopin if you have another seizure.  Please come back in 2-3 months or sooner if needed

## 2014-01-28 NOTE — Progress Notes (Signed)
[name] is a 63 y.o. female who presents to [facility] today for f/u for memory   Memory and panic attacks improved but last night husband reports 2 seizures that included generalized shaking for several minutes. Pt was rolled to side and required about 10 min to come to after the seizure. Denies any wetting of the sheets or tongue biting.   Taking klonopin 1/2pill BID. Only 1 per day now.   Dizzy: off and on positional dizziness. Room spinning. No emesis  The following portions of the patient's history were reviewed and updated as appropriate: allergies, current medications, past medical history, family and social history, and problem list.  Patient is a nonsmoker.  Past Medical History  Diagnosis Date  . Memory loss of unknown cause 03/04/2002    suspect depression with psychotic features. patient hospitalized and work-up negative.     ROS as above otherwise neg.    Medications reviewed. Current Outpatient Prescriptions  Medication Sig Dispense Refill  . atorvastatin (LIPITOR) 40 MG tablet Take 1 tablet (40 mg total) by mouth daily.  30 tablet  5  . citalopram (CELEXA) 20 MG tablet Take 1 tablet (20 mg total) by mouth daily.  90 tablet  3  . clonazePAM (KLONOPIN) 0.5 MG tablet Take 0.5 tablets (0.25 mg total) by mouth 2 (two) times daily as needed for anxiety.  30 tablet  3  . hydrochlorothiazide (HYDRODIURIL) 25 MG tablet Take 1 tablet (25 mg total) by mouth daily.  90 tablet  3  . hydrOXYzine (ATARAX/VISTARIL) 25 MG tablet Take 1 tablet (25 mg total) by mouth 2 (two) times daily as needed for itching.  180 tablet  3  . levothyroxine (SYNTHROID, LEVOTHROID) 50 MCG tablet Take 1 tablet (50 mcg total) by mouth daily. For hypothyroidism  90 tablet  3  . metFORMIN (GLUCOPHAGE) 500 MG tablet Take 1 tablet by mouth daily for 2 weeks then increase to 1 tablet by mouth twice a day.  60 tablet  6  . QUEtiapine (SEROQUEL) 50 MG tablet Take 1 tablet (50 mg total) by mouth at bedtime.  90 tablet   3  . SUMAtriptan (IMITREX) 100 MG tablet Take 1 tablet (100 mg total) by mouth as directed.  10 tablet  3  . terbinafine (LAMISIL) 250 MG tablet Take 1 tablet (250 mg total) by mouth daily.  112 tablet  0  . triamcinolone ointment (KENALOG) 0.1 % APPLY TOPICALLY TO AFFECTED AREA TWICE DAILY  30 g  1   No current facility-administered medications for this visit.    Exam: BP 139/96  Pulse 62  Ht 5\' 4"  (1.626 m)  Wt 144 lb (65.318 kg)  BMI 24.71 kg/m2  SpO2 96% Gen: Well NAD HEENT: EOMI,  MMM Neuro: slightly unstable on feet. CN grossly intact. Dix Hallpike neg  No results found for this or any previous visit (from the past 39 hour(s)).  A/P (as seen in Problem list)  DEPRESSION Much improved w/ close familyl support Consider DC celexa at next appt or at family discretion. Risks benefits discussed  ANXIETY STATE NOS Much improved w/ weaned down klonopin Continue decreased dose of klonopin  Continue atarax  MEMORY LOSS No real improvement but not worsening Keep appt a [facility] memory clinic in Aug  Seizure Possible szr last night x2 H/o szr in past in 2012 Pt well cared for byhusband Pt to take additional Klonopin if another occurs and to call EMS if has >2 szr at night and lasting >33min.  Dizzy spells Marye Round negative Still likely BPPV Meclizine

## 2014-01-28 NOTE — Assessment & Plan Note (Signed)
Much improved w/ weaned down klonopin Continue decreased dose of klonopin  Continue atarax

## 2014-01-28 NOTE — Assessment & Plan Note (Signed)
Much improved w/ close familyl support Consider DC celexa at next appt or at family discretion. Risks benefits discussed

## 2014-01-28 NOTE — Assessment & Plan Note (Signed)
No real improvement but not worsening Keep appt a [facility] in Aug

## 2014-05-27 ENCOUNTER — Other Ambulatory Visit: Payer: Self-pay | Admitting: Family Medicine

## 2014-05-31 NOTE — Telephone Encounter (Signed)
Pt needs to come in for apptmt in a month [facility], MD

## 2014-06-01 NOTE — Telephone Encounter (Signed)
Appt made for 06/30/2014 at 1:45 PM for office visit.  Derl Barrow, RN

## 2014-06-07 ENCOUNTER — Other Ambulatory Visit: Payer: Self-pay | Admitting: Family Medicine

## 2014-06-07 DIAGNOSIS — E785 Hyperlipidemia, unspecified: Secondary | ICD-10-CM

## 2014-06-07 DIAGNOSIS — R739 Hyperglycemia, unspecified: Secondary | ICD-10-CM

## 2014-06-07 MED ORDER — ATORVASTATIN CALCIUM 40 MG PO TABS: 40.0000 mg | ORAL_TABLET | Freq: Every day | ORAL | Status: DC

## 2014-06-07 MED ORDER — METFORMIN HCL 500 MG PO TABS: ORAL_TABLET | ORAL | Status: DC

## 2014-06-11 ENCOUNTER — Other Ambulatory Visit: Payer: Self-pay | Admitting: *Deleted

## 2014-06-11 MED ORDER — TRIAMCINOLONE ACETONIDE 0.1 % EX OINT: TOPICAL_OINTMENT | CUTANEOUS | Status: DC

## 2014-06-30 ENCOUNTER — Ambulatory Visit (INDEPENDENT_AMBULATORY_CARE_PROVIDER_SITE_OTHER): Payer: Medicare Other | Admitting: Family Medicine

## 2014-06-30 ENCOUNTER — Encounter: Payer: Self-pay | Admitting: Family Medicine

## 2014-06-30 VITALS — BP 122/74 | HR 64 | Temp 98.5°F | Wt 150.0 lb

## 2014-06-30 DIAGNOSIS — F070 Personality change due to known physiological condition: Secondary | ICD-10-CM

## 2014-06-30 DIAGNOSIS — M21619 Bunion of unspecified foot: Secondary | ICD-10-CM

## 2014-06-30 DIAGNOSIS — E119 Type 2 diabetes mellitus without complications: Secondary | ICD-10-CM

## 2014-06-30 DIAGNOSIS — I10 Essential (primary) hypertension: Secondary | ICD-10-CM

## 2014-06-30 DIAGNOSIS — M2010 Hallux valgus (acquired), unspecified foot: Secondary | ICD-10-CM

## 2014-06-30 DIAGNOSIS — Z1211 Encounter for screening for malignant neoplasm of colon: Secondary | ICD-10-CM

## 2014-06-30 LAB — POCT GLYCOSYLATED HEMOGLOBIN (HGB A1C): Hemoglobin A1C: 6.4

## 2014-06-30 MED ORDER — HYDROCHLOROTHIAZIDE 25 MG PO TABS: 25.0000 mg | ORAL_TABLET | Freq: Every day | ORAL | Status: DC

## 2014-06-30 NOTE — Addendum Note (Signed)
Addended by: Langston Masker C on: 06/30/2014 05:06 PM   Modules accepted: Orders

## 2014-06-30 NOTE — Progress Notes (Signed)
Patient ID: [name], female   DOB: Apr 25, 1951, 63 y.o.   MRN: [id number]   [facility] Bernadene Bell, MD Phone: [phone]  Subjective:  [name] is a 63 y.o F who presents to clinic (well known to [facility]) but first time meeting me  #Memory loss Clearly anxious, psychiatric features, husband seems to be used to this behavior   Spilling things, ripping out pages of her journal -has apptmt at [facility] in a few days  # HCM -mammogram- has not had in last year -colonoscopy- no hx of cancers, has never had screening -PAP smear- due next year  -zostavax- believes she had vaccine for shingles last year   #bunions -causing her significant discomfort -difficulty wearing shoes now -interested in surgical intervention  All relevant systems were reviewed and were negative unless otherwise noted in the HPI  Past Medical History Patient Active Problem List   Diagnosis Date Noted  . Dizzy spells 01/28/2014  . Onychomycosis 10/05/2013  . Hyperglycemia 10/05/2013  . Health care maintenance 07/04/2012  . Seizure 02/23/2010  . SLEEP DISORDER 01/05/2010  . DEPRESSION 09/13/2009  . ANXIETY STATE NOS 09/03/2007  . HYPOTHYROIDISM, UNSPECIFIED 01/23/2007  . HYPERLIPIDEMIA 01/23/2007  . MEMORY LOSS 01/23/2007  . MIGRAINE, UNSPEC., W/O INTRACTABLE MIGRAINE 01/23/2007  . HYPERTENSION, BENIGN SYSTEMIC 01/23/2007  . DJD, UNSPECIFIED 01/23/2007   Reviewed problem list.  Medications- reviewed and updated Chief complaint-noted No additions to family history Social history- patient is a never smoker  Objective: BP 150/84  Pulse 64  Temp(Src) 98.5 F (36.9 C) (Oral)  Wt 150 lb (68.04 kg) Gen: NAD, alert, cooperative with exam, anxious affect HEENT: NCAT, EOMI Neck: FROM, supple CV: RRR, good S1/S2, no murmur, cap refill <3 Resp: CTABL, no wheezes, non-labored Abd: SNTND, BS present, no guarding or organomegaly Ext: No edema, warm, normal tone, moves UE/LE  spontaneously; bilat bunion with bony deformity  Neuro: Alert and oriented Skin: no rashes no lesions  Assessment/Plan: See problem based a/p

## 2014-06-30 NOTE — Patient Instructions (Addendum)
Ms Glendenning it was great to see you today!  I am sorry to hear about your feet.  The orthopedic doctors will give you a call about potential surgery.  Please plan to obtain a colonoscopy and mammogram this year  I will give you a call if the A1C is more elevated than last year.   Continue your current medications as prescribed  Looking forward to seeing you soon Bernadene Bell, MD

## 2014-06-30 NOTE — Assessment & Plan Note (Signed)
May need surgical intervention at this point

## 2014-06-30 NOTE — Assessment & Plan Note (Signed)
At goal cont current meds

## 2014-06-30 NOTE — Assessment & Plan Note (Signed)
Agree with [facility]

## 2014-07-16 ENCOUNTER — Encounter: Payer: Self-pay | Admitting: Family Medicine

## 2014-07-16 NOTE — Progress Notes (Signed)
Patient ID: [name], female   DOB: [birth year], 63 y.o.   MRN: [id number] Unfortunately I am not sure what exactly he is referring to since I did not order any of these tests. I tried to look back to see if any of those were done in our system but apparently not there. Maybe they were done at [facility]? I know he had mentioned at appointment that they were going there shortly for some more testing. Sorry I cannot be more helpful [facility], MD

## 2014-07-16 NOTE — Progress Notes (Signed)
Pt's husband stopped by stating that [place] and he would like the results on XRay, EEG, EKG that pt had and the best contact number is [phone].

## 2014-07-16 NOTE — Progress Notes (Signed)
Will forward to Md. Morna Flud,CMA  

## 2014-07-19 NOTE — Progress Notes (Signed)
Spoke with husband and he needed her MRi results.  Printed and left up front for pick up. Helane Briceno,CMA

## 2014-07-28 ENCOUNTER — Other Ambulatory Visit: Payer: Self-pay | Admitting: *Deleted

## 2014-07-29 MED ORDER — CLONAZEPAM 0.5 MG PO TABS: ORAL_TABLET | ORAL | Status: DC

## 2014-07-29 NOTE — Telephone Encounter (Signed)
At front desk for pick up [facility], MD

## 2014-07-29 NOTE — Telephone Encounter (Signed)
Pt husband informed. [name], [name]

## 2014-08-24 ENCOUNTER — Telehealth: Payer: Self-pay | Admitting: *Deleted

## 2014-08-24 DIAGNOSIS — E039 Hypothyroidism, unspecified: Secondary | ICD-10-CM

## 2014-08-24 NOTE — Telephone Encounter (Signed)
Do we have any idea why it was denied? Like from our end or her insurance?

## 2014-08-24 NOTE — Telephone Encounter (Signed)
Calling re: pts thyroid medication, pharmacy says it was denied. Husband says she was in our office last month. Samara Snide

## 2014-08-25 ENCOUNTER — Other Ambulatory Visit: Payer: Medicare Other

## 2014-08-25 DIAGNOSIS — E039 Hypothyroidism, unspecified: Secondary | ICD-10-CM

## 2014-08-25 LAB — TSH: TSH: 4.79 u[IU]/mL — ABNORMAL HIGH (ref 0.350–4.500)

## 2014-08-25 NOTE — Progress Notes (Signed)
TSH DONE TODAY [name] 

## 2014-08-25 NOTE — Telephone Encounter (Signed)
Ok I am placing another order for a TSH Looks like she is due for one. Last one was drawn around 09/2013 I will temporarily refill medicine if she has been taking consistently. Does she usually have this medication filled by [place] or somewhere else?  [facility], MD

## 2014-08-25 NOTE — Telephone Encounter (Signed)
No she gets them filled here. Spoke with husband, he will bring her in later this AM to have the TSH drawn.  They will wait until we have the results to go pick up the meds. [name], [name]

## 2014-08-25 NOTE — Telephone Encounter (Signed)
Looks as if it has not been filled since 10/26/13. [name], [name]

## 2014-08-26 MED ORDER — LEVOTHYROXINE SODIUM 50 MCG PO TABS: 50.0000 ug | ORAL_TABLET | Freq: Every day | ORAL | Status: DC

## 2014-08-26 NOTE — Telephone Encounter (Signed)
Will restart synthroid at same previous dose bc I have a feeling she has not been taking for a while and this is why her TSh is elevated the way that it is. Pt should schedule another visit in 4 weeks for titration of this medication. If she is otherwise doing ok I will put in lab order and we can adjust over the phone like this, [facility], MD

## 2014-08-26 NOTE — Telephone Encounter (Signed)
Pt husband informed.  He will call back to schedule appt. [name], [name]

## 2014-09-30 ENCOUNTER — Other Ambulatory Visit: Payer: Self-pay | Admitting: *Deleted

## 2014-09-30 NOTE — Telephone Encounter (Signed)
Pt's husband called stating that pt is down to her last five days of clonazePAM (KLONOPIN) 0.5 MG tablet.  Please call when ready.  Derl Barrow, RN

## 2014-10-01 MED ORDER — CLONAZEPAM 0.5 MG PO TABS: ORAL_TABLET | ORAL | Status: DC

## 2014-10-01 NOTE — Telephone Encounter (Signed)
Front desk

## 2014-10-01 NOTE — Telephone Encounter (Signed)
Pt is aware and will husband know.  Thanks [employer]

## 2014-10-01 NOTE — Telephone Encounter (Signed)
Sure. May not be able to do until this pm however. Since this will require hard copy. [facility], MD

## 2014-10-01 NOTE — Telephone Encounter (Signed)
Has been >3 months. Pt will need to be seen again in clinic for prescription given the new controlled substances guidelines. I apologize for the inconvience  [facility], MD

## 2014-10-01 NOTE — Telephone Encounter (Signed)
Pt has an appt on 10/20/14 to see you.  Can you give her enough to make it til appt?  Runette Scifres,CMA

## 2014-10-20 ENCOUNTER — Encounter: Payer: Self-pay | Admitting: Family Medicine

## 2014-10-20 ENCOUNTER — Ambulatory Visit (INDEPENDENT_AMBULATORY_CARE_PROVIDER_SITE_OTHER): Payer: Medicare Other | Admitting: *Deleted

## 2014-10-20 ENCOUNTER — Ambulatory Visit (INDEPENDENT_AMBULATORY_CARE_PROVIDER_SITE_OTHER): Payer: Medicare Other | Admitting: Family Medicine

## 2014-10-20 VITALS — BP 120/60 | HR 72 | Temp 98.4°F | Ht 64.0 in | Wt 147.0 lb

## 2014-10-20 DIAGNOSIS — F411 Generalized anxiety disorder: Secondary | ICD-10-CM

## 2014-10-20 DIAGNOSIS — E785 Hyperlipidemia, unspecified: Secondary | ICD-10-CM

## 2014-10-20 DIAGNOSIS — E039 Hypothyroidism, unspecified: Secondary | ICD-10-CM | POA: Diagnosis not present

## 2014-10-20 DIAGNOSIS — E119 Type 2 diabetes mellitus without complications: Secondary | ICD-10-CM | POA: Diagnosis not present

## 2014-10-20 DIAGNOSIS — Z1211 Encounter for screening for malignant neoplasm of colon: Secondary | ICD-10-CM

## 2014-10-20 DIAGNOSIS — Z79899 Other long term (current) drug therapy: Secondary | ICD-10-CM

## 2014-10-20 DIAGNOSIS — R739 Hyperglycemia, unspecified: Secondary | ICD-10-CM | POA: Diagnosis not present

## 2014-10-20 DIAGNOSIS — Z23 Encounter for immunization: Secondary | ICD-10-CM

## 2014-10-20 DIAGNOSIS — Z0000 Encounter for general adult medical examination without abnormal findings: Secondary | ICD-10-CM

## 2014-10-20 LAB — COMPREHENSIVE METABOLIC PANEL
ALT: 10 U/L (ref 0–35)
AST: 14 U/L (ref 0–37)
Albumin: 4 g/dL (ref 3.5–5.2)
Alkaline Phosphatase: 81 U/L (ref 39–117)
BUN: 12 mg/dL (ref 6–23)
CO2: 31 mEq/L (ref 19–32)
Calcium: 9.2 mg/dL (ref 8.4–10.5)
Chloride: 99 mEq/L (ref 96–112)
Creat: 0.76 mg/dL (ref 0.50–1.10)
Glucose, Bld: 116 mg/dL — ABNORMAL HIGH (ref 70–99)
Potassium: 3.6 mEq/L (ref 3.5–5.3)
Sodium: 139 mEq/L (ref 135–145)
Total Bilirubin: 0.4 mg/dL (ref 0.2–1.2)
Total Protein: 7 g/dL (ref 6.0–8.3)

## 2014-10-20 LAB — TSH: TSH: 4.886 u[IU]/mL — ABNORMAL HIGH (ref 0.350–4.500)

## 2014-10-20 MED ORDER — METFORMIN HCL 500 MG PO TABS: ORAL_TABLET | ORAL | Status: DC

## 2014-10-20 MED ORDER — CITALOPRAM HYDROBROMIDE 20 MG PO TABS: 20.0000 mg | ORAL_TABLET | Freq: Every day | ORAL | Status: DC

## 2014-10-20 MED ORDER — TRIAMCINOLONE ACETONIDE 0.1 % EX CREA: 1.0000 "application " | TOPICAL_CREAM | Freq: Two times a day (BID) | CUTANEOUS | Status: DC

## 2014-10-20 MED ORDER — CLONAZEPAM 0.5 MG PO TABS: ORAL_TABLET | ORAL | Status: DC

## 2014-10-20 MED ORDER — HYDROCHLOROTHIAZIDE 25 MG PO TABS: 25.0000 mg | ORAL_TABLET | Freq: Every day | ORAL | Status: DC

## 2014-10-20 MED ORDER — QUETIAPINE FUMARATE 50 MG PO TABS: 50.0000 mg | ORAL_TABLET | Freq: Every day | ORAL | Status: DC

## 2014-10-20 MED ORDER — ATORVASTATIN CALCIUM 40 MG PO TABS: 40.0000 mg | ORAL_TABLET | Freq: Every day | ORAL | Status: DC

## 2014-10-20 NOTE — Progress Notes (Signed)
Patient ID: QUETZALLY CALLAS, female   DOB: 02/15/1951, 63 y.o.   MRN: [id number]   [facility] Bernadene Bell, MD Phone: [phone]  Subjective:  Ms Goracke is a 63 y.o F who presents to clinic for f/up  #Anxiety/memory loss  -doing well with current regimen -less forgetful episodes than previously -was seen at [facility] for formal evaluation  -husband still continues to manage majority of her medications  # HCM -mammogram- has not had in last year -colonoscopy- no hx of cancers, has never had screening -PAP smear- due next year  -foot exam done today  -needs to go see eye doctor   All relevant systems were reviewed and were negative unless otherwise noted in the HPI  Past Medical History Patient Active Problem List   Diagnosis Date Noted  . Bunion 06/30/2014  . Dizzy spells 01/28/2014  . Onychomycosis 10/05/2013  . Hyperglycemia 10/05/2013  . Health care maintenance 07/04/2012  . Seizure 02/23/2010  . SLEEP DISORDER 01/05/2010  . DEPRESSION 09/13/2009  . ANXIETY STATE NOS 09/03/2007  . HYPOTHYROIDISM, UNSPECIFIED 01/23/2007  . HYPERLIPIDEMIA 01/23/2007  . MEMORY LOSS 01/23/2007  . MIGRAINE, UNSPEC., W/O INTRACTABLE MIGRAINE 01/23/2007  . HYPERTENSION, BENIGN SYSTEMIC 01/23/2007  . DJD, UNSPECIFIED 01/23/2007   Reviewed problem list.  Medications- reviewed and updated Chief complaint-noted No additions to family history Social history- patient is a never smoker  Objective: BP 120/60 mmHg  Pulse 72  Temp(Src) 98.4 F (36.9 C) (Oral)  Ht 5\' 4"  (1.626 m)  Wt 147 lb (66.679 kg)  BMI 25.22 kg/m2 Gen: NAD, alert, cooperative with exam, anxious affect HEENT: NCAT, EOMI Ext: No edema, warm, normal tone, moves UE/LE spontaneously; bilat bunion with bony deformity now s/p repain Neuro: Alert and oriented Skin: eczematous rash around neck **see diabetic foot exam**  Assessment/Plan: See problem based a/p

## 2014-10-20 NOTE — Assessment & Plan Note (Signed)
No PAP at this time  Will obtain next year  Referral placed for colonoscopy Mammogram form given  Microalbumin today TSH/CMET

## 2014-10-20 NOTE — Patient Instructions (Signed)
Continue current medications  I will place an order for a referral for a colonoscopy  Please obtain a mammogram sometime this next year You will also be due for a pap smear at that time  I will let you know if any of the labs look abnormal  Have a nice holiday Bernadene Bell, MD

## 2014-10-20 NOTE — Assessment & Plan Note (Signed)
Doing better on current med regimen Cont klonopin at current dosing Seroquel and celexa  Cont regimen discussed at [facility]

## 2014-10-21 LAB — MICROALBUMIN / CREATININE URINE RATIO
Creatinine, Urine: 156.1 mg/dL
Microalb Creat Ratio: 5.8 mg/g (ref 0.0–30.0)
Microalb, Ur: 0.9 mg/dL (ref <2.0)

## 2014-10-25 ENCOUNTER — Telehealth: Payer: Self-pay | Admitting: Family Medicine

## 2014-10-25 DIAGNOSIS — E039 Hypothyroidism, unspecified: Secondary | ICD-10-CM

## 2014-10-25 MED ORDER — LEVOTHYROXINE SODIUM 75 MCG PO TABS: 75.0000 ug | ORAL_TABLET | Freq: Every day | ORAL | Status: DC

## 2014-10-25 NOTE — Telephone Encounter (Signed)
Will increase synthyroid.  Recheck TSH in 8-12 weeks [facility], MD  Will place lab order and send new rx to pharm Can we let husband know thx

## 2014-11-01 ENCOUNTER — Encounter: Payer: Self-pay | Admitting: Internal Medicine

## 2014-12-22 ENCOUNTER — Other Ambulatory Visit: Payer: Self-pay | Admitting: Family Medicine

## 2014-12-22 MED ORDER — HYDROXYZINE HCL 25 MG PO TABS: 25.0000 mg | ORAL_TABLET | Freq: Two times a day (BID) | ORAL | Status: DC | PRN

## 2014-12-22 NOTE — Telephone Encounter (Signed)
Husband called because wife is out of her Hydroxyzine. The pharmacy has been faxing [place] with no response. jw

## 2015-01-05 ENCOUNTER — Other Ambulatory Visit: Payer: Self-pay | Admitting: Student

## 2015-01-05 ENCOUNTER — Telehealth: Payer: Self-pay | Admitting: Student

## 2015-01-05 DIAGNOSIS — F4323 Adjustment disorder with mixed anxiety and depressed mood: Secondary | ICD-10-CM

## 2015-01-05 MED ORDER — LEVOCETIRIZINE DIHYDROCHLORIDE 5 MG PO TABS: 5.0000 mg | ORAL_TABLET | Freq: Every evening | ORAL | Status: DC

## 2015-01-05 MED ORDER — LEVOCETIRIZINE DIHYDROCHLORIDE 5 MG PO TABS: 2.5000 mg | ORAL_TABLET | Freq: Every evening | ORAL | Status: DC

## 2015-01-05 NOTE — Telephone Encounter (Signed)
[name] called regarding her Hydroxyzine. We just received a letter from her insurance company stating that it is non-formulary. She did receive a 30 day supply when her husband went to fill this prescription on 12/23/2014. She has severe memory loss and her husband is her power of attorney. He was not present when this call was made. Will call again later when he is present and discuss further treatment options or if Vistaril has been beneficial for her. She made a note of this and additionally was told to call the clinic with further questions or concerns.   Marina Goodell MD, MS

## 2015-01-05 NOTE — Telephone Encounter (Signed)
Pt was again called and her husband was present. His status as POA was confirmed by the patient. He confirmed that he filled Vistaril on 1/28, but he was not aware that his insurance will no longer provide it after the 30 day supply that was given. Levocetirizine  was offered as an alternative by [employer]. Ms. Zani husband confirmed that the vistaril was prescribed for anxiety and has been helping at greatly reducing her panic attacks. He desired to continue therapy and is interested in trying levocetirizine. Will phone Levocetirizine 5mg  to her pharmacy. She also needs repeat thyroid testing. She was found to have an elevated TSH to 4.8. Her synthroid was increased to 75 mcg at that time. Will have her follow in clinic within the next month.  Marina Goodell MD, MS

## 2015-01-07 ENCOUNTER — Ambulatory Visit (AMBULATORY_SURGERY_CENTER): Payer: Self-pay

## 2015-01-07 VITALS — Ht 63.5 in | Wt 151.8 lb

## 2015-01-07 DIAGNOSIS — Z1211 Encounter for screening for malignant neoplasm of colon: Secondary | ICD-10-CM

## 2015-01-07 NOTE — Progress Notes (Signed)
No allergies to eggs or soy No home oxygen No diet/weight loss meds No past problems with anesthesia  Has email  Emmi instructions given for colonoscopy 

## 2015-01-18 ENCOUNTER — Telehealth: Payer: Self-pay | Admitting: Internal Medicine

## 2015-01-18 NOTE — Telephone Encounter (Signed)
Pt called and notified all prep is over the counter. Pt had instructions and told to look under miralax and it says to purchase the dulcolax, miralax and gatorade. Instructed no red or purple but all over the counter, no prescription at all. Pt verbalized understanding of these instructions given and told to cal with further questions.   Marie pV

## 2015-01-21 ENCOUNTER — Ambulatory Visit (AMBULATORY_SURGERY_CENTER): Payer: Medicare Other | Admitting: Internal Medicine

## 2015-01-21 ENCOUNTER — Encounter: Payer: Self-pay | Admitting: Internal Medicine

## 2015-01-21 VITALS — BP 133/73 | HR 60 | Temp 98.2°F | Resp 17 | Ht 63.5 in | Wt 151.0 lb

## 2015-01-21 DIAGNOSIS — D123 Benign neoplasm of transverse colon: Secondary | ICD-10-CM | POA: Diagnosis not present

## 2015-01-21 DIAGNOSIS — D122 Benign neoplasm of ascending colon: Secondary | ICD-10-CM

## 2015-01-21 DIAGNOSIS — Z1211 Encounter for screening for malignant neoplasm of colon: Secondary | ICD-10-CM

## 2015-01-21 DIAGNOSIS — D120 Benign neoplasm of cecum: Secondary | ICD-10-CM

## 2015-01-21 LAB — GLUCOSE, CAPILLARY
Glucose-Capillary: 91 mg/dL (ref 70–99)
Glucose-Capillary: 154 mg/dL — ABNORMAL HIGH (ref 70–99)

## 2015-01-21 MED ORDER — SODIUM CHLORIDE 0.9 % IV SOLN: 500.0000 mL | INTRAVENOUS | Status: DC

## 2015-01-21 NOTE — Progress Notes (Signed)
Called to room to assist during endoscopic procedure.  Patient ID and intended procedure confirmed with present staff. Received instructions for my participation in the procedure from the performing physician.  

## 2015-01-21 NOTE — Op Note (Signed)
[place]  [address]. [place], [address]   COLONOSCOPY PROCEDURE REPORT  PATIENT: [name], [name]  MR#: [id number] BIRTHDATE: [birth year] , 63  yrs. old GENDER: female ENDOSCOPIST: Gatha Mayer, MD, [facility] PROCEDURE DATE:  01/21/2015 PROCEDURE:   Colonoscopy with biopsy and Colonoscopy with snare polypectomy First Screening Colonoscopy - Avg.  risk and is 50 yrs.  old or older Yes.  Prior Negative Screening - Now for repeat screening. N/A  History of Adenoma - Now for follow-up colonoscopy & has been > or = to 3 yrs.  N/A  Polyps Removed Today? Yes. ASA CLASS:   Class III INDICATIONS:average risk patient for colorectal cancer. MEDICATIONS: Propofol 300 mg IV and Monitored anesthesia care  DESCRIPTION OF PROCEDURE:   After the risks benefits and alternatives of the procedure were thoroughly explained, informed consent was obtained.  The digital rectal exam revealed no abnormalities of the rectum.   The LB [id number]  endoscope was introduced through the anus and advanced to the cecum, which was identified by both the appendix and ileocecal valve. No adverse events experienced.   The quality of the prep was adequate, using MiraLax  The instrument was then slowly withdrawn as the colon was fully examined.   COLON FINDINGS: 1) 15 mm sessile cecal polyp completey removed in 2 pieces with snare cautery. 2) 5 mm cecal polyp completely removed cold snare. 3) 2 mm ascending polyp completely removed cold biopsy 4) 7 mm transverse polyp completely removed cold snare. 5) Otherwise normal colonoscopy with adequate prep and all polyps sent to pathology.  Retroflexed views revealed no abnormalities. The time to cecum=7 minutes 06 seconds.  Withdrawal time=19 minutes 53 seconds.  The scope was withdrawn and the procedure completed. COMPLICATIONS: There were no immediate complications.  ENDOSCOPIC IMPRESSION: 1) 15 mm sessile cecal polyp completey removed in 2  pieces with snare cautery. 2) 5 mm cecal polyp completely removed cold snare. 3) 2 mm ascending polyp completely removed cold biopsy 4) 7 mm transverse polyp completely removed cold snare. 5) Otherwise normal colonoscopy with adequate prep and all polyps sent to pathology  RECOMMENDATIONS: 1.  Hold Aspirin and all other NSAIDS for 2 weeks. 2.  Timing of repeat colonoscopy will be determined by pathology findings.  eSigned:  Gatha Mayer, MD, [facility] 01/21/2015 9:09 AM   cc: The Patient and Langston Masker, MD

## 2015-01-21 NOTE — Progress Notes (Signed)
Report to PACU, RN, vss, BBS= Clear.  

## 2015-01-21 NOTE — Progress Notes (Signed)
Husband at the bedside as pt has some memory loss of unknown origin. ewm , rn

## 2015-01-21 NOTE — Patient Instructions (Addendum)
I found and removed 4 polyps. I will let you know pathology results and when to have another routine colonoscopy by mail.  I appreciate the opportunity to care for you. Gatha Mayer, MD, FACG  YOU HAD AN ENDOSCOPIC PROCEDURE TODAY AT [facility]: Refer to the procedure report that was given to you for any specific questions about what was found during the examination.  If the procedure report does not answer your questions, please call your gastroenterologist to clarify.  If you requested that your care partner not be given the details of your procedure findings, then the procedure report has been included in a sealed envelope for you to review at your convenience later.  YOU SHOULD EXPECT: Some feelings of bloating in the abdomen. Passage of more gas than usual.  Walking can help get rid of the air that was put into your GI tract during the procedure and reduce the bloating. If you had a lower endoscopy (such as a colonoscopy or flexible sigmoidoscopy) you may notice spotting of blood in your stool or on the toilet paper. If you underwent a bowel prep for your procedure, then you may not have a normal bowel movement for a few days.  DIET: Your first meal following the procedure should be a light meal and then it is ok to progress to your normal diet.  A half-sandwich or bowl of soup is an example of a good first meal.  Heavy or fried foods are harder to digest and may make you feel nauseous or bloated.  Likewise meals heavy in dairy and vegetables can cause extra gas to form and this can also increase the bloating.  Drink plenty of fluids but you should avoid alcoholic beverages for 24 hours.  ACTIVITY: Your care partner should take you home directly after the procedure.  You should plan to take it easy, moving slowly for the rest of the day.  You can resume normal activity the day after the procedure however you should NOT DRIVE or use heavy machinery for 24 hours (because  of the sedation medicines used during the test).    SYMPTOMS TO REPORT IMMEDIATELY: A gastroenterologist can be reached at any hour.  During normal business hours, 8:30 AM to 5:00 PM Monday through Friday, call [phone].  After hours and on weekends, please call the GI answering service at [phone] who will take a message and have the physician on call contact you.   Following lower endoscopy (colonoscopy or flexible sigmoidoscopy):  Excessive amounts of blood in the stool  Significant tenderness or worsening of abdominal pains  Swelling of the abdomen that is new, acute  Fever of 100F or higher   FOLLOW UP: If any biopsies were taken you will be contacted by phone or by letter within the next 1-3 weeks.  Call your gastroenterologist if you have not heard about the biopsies in 3 weeks.  Our staff will call the home number listed on your records the next business day following your procedure to check on you and address any questions or concerns that you may have at that time regarding the information given to you following your procedure. This is a courtesy call and so if there is no answer at the home number and we have not heard from you through the emergency physician on call, we will assume that you have returned to your regular daily activities without incident.  SIGNATURES/CONFIDENTIALITY: You and/or your care partner have signed paperwork which  will be entered into your electronic medical record.  These signatures attest to the fact that that the information above on your After Visit Summary has been reviewed and is understood.  Full responsibility of the confidentiality of this discharge information lies with you and/or your care-partner.  Polyp information given.

## 2015-01-24 ENCOUNTER — Telehealth: Payer: Self-pay | Admitting: *Deleted

## 2015-01-24 NOTE — Telephone Encounter (Signed)
  Follow up Call-  Call back number 01/21/2015  Post procedure Call Back phone  # [phone]  Permission to leave phone message Yes     Patient questions:  Do you have a fever, pain , or abdominal swelling? No. Pain Score  0 *  Have you tolerated food without any problems? Yes.    Have you been able to return to your normal activities? Yes.    Do you have any questions about your discharge instructions: Diet   No. Medications  No. Follow up visit  No.  Do you have questions or concerns about your Care? No.  Actions: * If pain score is 4 or above: No action needed, pain <4.

## 2015-01-26 ENCOUNTER — Encounter: Payer: Self-pay | Admitting: Internal Medicine

## 2015-01-26 DIAGNOSIS — Z860101 Personal history of adenomatous and serrated colon polyps: Secondary | ICD-10-CM | POA: Insufficient documentation

## 2015-01-26 DIAGNOSIS — Z86010 Personal history of colonic polyps: Secondary | ICD-10-CM

## 2015-01-26 HISTORY — DX: Personal history of adenomatous and serrated colon polyps: Z86.0101

## 2015-01-26 HISTORY — DX: Personal history of colonic polyps: Z86.010

## 2015-01-26 NOTE — Progress Notes (Signed)
Quick Note:  4 adenomas max 15 mm repeat colon 2019 ______

## 2015-01-28 ENCOUNTER — Other Ambulatory Visit: Payer: Self-pay | Admitting: Student

## 2015-01-28 MED ORDER — CLONAZEPAM 0.5 MG PO TABS: ORAL_TABLET | ORAL | Status: DC

## 2015-01-28 NOTE — Telephone Encounter (Signed)
Husband called for a refill on her Klonopin. jw

## 2015-01-31 ENCOUNTER — Telehealth: Payer: Self-pay | Admitting: Student

## 2015-01-31 NOTE — Telephone Encounter (Signed)
Pt called and after confirming two identifiers she was informed of her colonoscopy results and need for a repeat colonoscopy in 2019. All questions were answered to the patient's satisfaction  Roselind Klus A. Lincoln Brigham MD, [facility] Resident PGY-1 Pager [phone]

## 2015-04-04 ENCOUNTER — Other Ambulatory Visit: Payer: Self-pay | Admitting: Student

## 2015-04-04 NOTE — Telephone Encounter (Signed)
Pt called and needs a refill on her Klonopin called in to [facility]. jw

## 2015-04-05 MED ORDER — CLONAZEPAM 0.5 MG PO TABS: ORAL_TABLET | ORAL | Status: DC

## 2015-04-05 NOTE — Telephone Encounter (Signed)
Py called to let her know klonipin was refilled. She did not answer. Will try again at a later time  Adelina Collard A. Lincoln Brigham MD, [facility] Resident PGY-1 Pager [phone]

## 2015-04-21 ENCOUNTER — Encounter: Payer: Self-pay | Admitting: Student

## 2015-04-21 ENCOUNTER — Ambulatory Visit (INDEPENDENT_AMBULATORY_CARE_PROVIDER_SITE_OTHER): Payer: Medicare Other | Admitting: Student

## 2015-04-21 VITALS — BP 131/85 | HR 62 | Temp 97.8°F | Ht 64.0 in | Wt 152.0 lb

## 2015-04-21 DIAGNOSIS — E119 Type 2 diabetes mellitus without complications: Secondary | ICD-10-CM | POA: Insufficient documentation

## 2015-04-21 DIAGNOSIS — E038 Other specified hypothyroidism: Secondary | ICD-10-CM | POA: Diagnosis not present

## 2015-04-21 DIAGNOSIS — Z0000 Encounter for general adult medical examination without abnormal findings: Secondary | ICD-10-CM | POA: Diagnosis not present

## 2015-04-21 DIAGNOSIS — R569 Unspecified convulsions: Secondary | ICD-10-CM

## 2015-04-21 DIAGNOSIS — Z01419 Encounter for gynecological examination (general) (routine) without abnormal findings: Secondary | ICD-10-CM

## 2015-04-21 DIAGNOSIS — E118 Type 2 diabetes mellitus with unspecified complications: Secondary | ICD-10-CM | POA: Diagnosis not present

## 2015-04-21 HISTORY — DX: Type 2 diabetes mellitus without complications: E11.9

## 2015-04-21 LAB — TSH: TSH: 0.733 u[IU]/mL (ref 0.350–4.500)

## 2015-04-21 LAB — BASIC METABOLIC PANEL
BUN: 12 mg/dL (ref 6–23)
CO2: 29 mEq/L (ref 19–32)
Calcium: 9.8 mg/dL (ref 8.4–10.5)
Chloride: 100 mEq/L (ref 96–112)
Creat: 0.58 mg/dL (ref 0.50–1.10)
Glucose, Bld: 108 mg/dL — ABNORMAL HIGH (ref 70–99)
Potassium: 4.6 mEq/L (ref 3.5–5.3)
Sodium: 137 mEq/L (ref 135–145)

## 2015-04-21 LAB — POCT GLYCOSYLATED HEMOGLOBIN (HGB A1C): Hemoglobin A1C: 6.6

## 2015-04-21 MED ORDER — QUETIAPINE FUMARATE 50 MG PO TABS: 25.0000 mg | ORAL_TABLET | Freq: Every day | ORAL | Status: DC

## 2015-04-21 MED ORDER — CITALOPRAM HYDROBROMIDE 20 MG PO TABS: 20.0000 mg | ORAL_TABLET | Freq: Every day | ORAL | Status: DC

## 2015-04-21 NOTE — Patient Instructions (Signed)
-   Return in two weeks - DO NOT drive until follow up with [place] neurology - DO NOT cook if alone in house - Go to ED if upi have seizures or worsening mental status - Call with questions -
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
## 2015-04-22 NOTE — Assessment & Plan Note (Addendum)
Seizure activity stable with keppra 500 mg BID started at last Neuro visit  ( 09/28/2014) with no more seizure like events since prior to that visit - Continue to follow with [facility] neurolog[facility] - discussed that she should not drive until the discussion if broached with her neurologist at her next visit in Oct 2016

## 2015-04-22 NOTE — Assessment & Plan Note (Signed)
Stable memory loss, but issues with driving and supervision while husband is away - Will hold driving until neuro visit recs on Oct 2016. Discussed that some one will need to drive her as needed - Discussed supervision at all times, she is against having a 24 hour nurse in the house. But is able to call sister who live close by when husband is away - Wil order PT/OT eval for ability to perform household functions

## 2015-04-22 NOTE — Assessment & Plan Note (Signed)
-  A1c 6.6 5/26  - foot exam 04/21/2015, next due 2017

## 2015-04-22 NOTE — Assessment & Plan Note (Signed)
Depression exacerbated over frustration over memory loss - Will not change current antidepressant as she is on multiple nero-active medications - discussed outside activities like walking which will also help her diabetes - she is interested in volunteering, will continue to assess if this is pursued, will aid as possible - Seroquel decreased to 25 from 50 per night as pt alsoon vistaril for sleep and she is sleeping well - cosider d/cing Klonipin as she ages

## 2015-05-05 ENCOUNTER — Other Ambulatory Visit: Payer: Self-pay | Admitting: Student

## 2015-05-05 DIAGNOSIS — E039 Hypothyroidism, unspecified: Secondary | ICD-10-CM

## 2015-05-05 NOTE — Telephone Encounter (Signed)
Pt called and needs a refill on her Levothyroxine. jw ° °

## 2015-05-06 MED ORDER — LEVOTHYROXINE SODIUM 75 MCG PO TABS: 75.0000 ug | ORAL_TABLET | Freq: Every day | ORAL | Status: DC

## 2015-05-06 NOTE — Telephone Encounter (Signed)
Levothyroxine filled, please inform pt  Thank you!  Tommy Minichiello A. Lincoln Brigham MD, [facility] Resident PGY-1

## 2015-05-24 ENCOUNTER — Other Ambulatory Visit: Payer: Self-pay | Admitting: *Deleted

## 2015-05-25 MED ORDER — CLONAZEPAM 0.5 MG PO TABS: ORAL_TABLET | ORAL | Status: DC

## 2015-05-25 NOTE — Telephone Encounter (Signed)
Tried to call patient but there was no voicemail set up.  Please let patient know if she calls back. Tarence Searcy,CMA

## 2015-05-25 NOTE — Telephone Encounter (Signed)
Refill printed and placed in box. Please inform the patient Thanks  Brek Reece A. Lincoln Brigham MD, [facility] Resident PGY-1 Pager [phone]

## 2015-05-27 NOTE — Telephone Encounter (Signed)
Tried calling pt to informed her that Rx for Klonopin is ready for pick up.  Voice mail is full.  Derl Barrow, RN

## 2015-05-31 ENCOUNTER — Telehealth: Payer: Self-pay | Admitting: Student

## 2015-07-19 ENCOUNTER — Other Ambulatory Visit: Payer: Self-pay | Admitting: *Deleted

## 2015-07-21 ENCOUNTER — Other Ambulatory Visit: Payer: Self-pay | Admitting: Student

## 2015-07-21 NOTE — Telephone Encounter (Signed)
Klonopin called into her pharmacy. Please inform the patient  Thank you  Benjimen Kelley A. Lincoln Brigham MD, [facility] Resident PGY-2 Pager [phone]

## 2015-07-21 NOTE — Telephone Encounter (Signed)
Needs refill on clonzapam  [facility] [address]

## 2015-07-25 MED ORDER — ATORVASTATIN CALCIUM 40 MG PO TABS: 40.0000 mg | ORAL_TABLET | Freq: Every day | ORAL | Status: DC

## 2015-07-25 NOTE — Telephone Encounter (Signed)
Clonazepam already called into pharmacy. Lipitor refilled  Please inform pt  Thanks  Alysiana Ethridge A. Lincoln Brigham MD, [facility] Resident PGY-2 Pager [phone]

## 2015-10-31 ENCOUNTER — Other Ambulatory Visit: Payer: Self-pay | Admitting: Family Medicine

## 2015-10-31 NOTE — Telephone Encounter (Signed)
Synthroid refilled.

## 2015-10-31 NOTE — Telephone Encounter (Signed)
Refill request from pharmacy. Will forward to PCP for review. Jameela Michna, CMA. 

## 2016-01-09 ENCOUNTER — Other Ambulatory Visit: Payer: Self-pay | Admitting: *Deleted

## 2016-01-09 DIAGNOSIS — R739 Hyperglycemia, unspecified: Secondary | ICD-10-CM

## 2016-01-10 MED ORDER — METFORMIN HCL 500 MG PO TABS: ORAL_TABLET | ORAL | Status: DC

## 2016-01-16 ENCOUNTER — Other Ambulatory Visit: Payer: Self-pay | Admitting: Student

## 2016-01-16 MED ORDER — HYDROCHLOROTHIAZIDE 25 MG PO TABS: 25.0000 mg | ORAL_TABLET | Freq: Every day | ORAL | Status: DC

## 2016-01-16 NOTE — Telephone Encounter (Signed)
Spoke with husband and appt was made for 01-25-16 at [place]

## 2016-01-16 NOTE — Telephone Encounter (Signed)
Husband is calling to get refills on his wife's medications. Klonopin and Hydrochlorothiazide. jw

## 2016-01-16 NOTE — Telephone Encounter (Signed)
Patient has not been seen here in 9 months. Will give 30-day supply of HCTZ. Will not refill klonopin without an office visit.  Algis Greenhouse. Jerline Pain, [facility] Resident PGY-2 01/16/2016 1:53 PM

## 2016-01-25 ENCOUNTER — Encounter: Payer: Self-pay | Admitting: Student

## 2016-01-25 ENCOUNTER — Other Ambulatory Visit (HOSPITAL_COMMUNITY)
Admission: RE | Admit: 2016-01-25 | Discharge: 2016-01-25 | Disposition: A | Discharge status: Home / Self Care | Payer: Medicare Other | Source: Ambulatory Visit | Attending: Family Medicine | Admitting: Family Medicine

## 2016-01-25 ENCOUNTER — Ambulatory Visit (INDEPENDENT_AMBULATORY_CARE_PROVIDER_SITE_OTHER): Payer: Medicare Other | Admitting: Student

## 2016-01-25 VITALS — BP 155/71 | HR 63 | Temp 98.3°F | Wt 152.0 lb

## 2016-01-25 DIAGNOSIS — Z01419 Encounter for gynecological examination (general) (routine) without abnormal findings: Secondary | ICD-10-CM | POA: Diagnosis present

## 2016-01-25 DIAGNOSIS — E039 Hypothyroidism, unspecified: Secondary | ICD-10-CM

## 2016-01-25 DIAGNOSIS — Z1151 Encounter for screening for human papillomavirus (HPV): Secondary | ICD-10-CM | POA: Diagnosis present

## 2016-01-25 DIAGNOSIS — E118 Type 2 diabetes mellitus with unspecified complications: Secondary | ICD-10-CM

## 2016-01-25 DIAGNOSIS — R739 Hyperglycemia, unspecified: Secondary | ICD-10-CM

## 2016-01-25 DIAGNOSIS — I10 Essential (primary) hypertension: Secondary | ICD-10-CM | POA: Diagnosis not present

## 2016-01-25 DIAGNOSIS — Z23 Encounter for immunization: Secondary | ICD-10-CM

## 2016-01-25 DIAGNOSIS — F4323 Adjustment disorder with mixed anxiety and depressed mood: Secondary | ICD-10-CM | POA: Diagnosis not present

## 2016-01-25 DIAGNOSIS — Z124 Encounter for screening for malignant neoplasm of cervix: Secondary | ICD-10-CM

## 2016-01-25 DIAGNOSIS — Z0000 Encounter for general adult medical examination without abnormal findings: Secondary | ICD-10-CM

## 2016-01-25 LAB — BASIC METABOLIC PANEL WITH GFR
BUN: 7 mg/dL (ref 7–25)
CO2: 32 mmol/L — ABNORMAL HIGH (ref 20–31)
Calcium: 8.5 mg/dL — ABNORMAL LOW (ref 8.6–10.4)
Chloride: 97 mmol/L — ABNORMAL LOW (ref 98–110)
Creat: 0.55 mg/dL (ref 0.50–0.99)
GFR, Est African American: >89 mL/min (ref >=60)
GFR, Est Non African American: >89 mL/min (ref >=60)
Glucose, Bld: 105 mg/dL — ABNORMAL HIGH (ref 65–99)
Potassium: 3 mmol/L — ABNORMAL LOW (ref 3.5–5.3)
Sodium: 141 mmol/L (ref 135–146)

## 2016-01-25 LAB — POCT GLYCOSYLATED HEMOGLOBIN (HGB A1C): Hemoglobin A1C: 6.2

## 2016-01-25 LAB — TSH: TSH: 0.75 mIU/L

## 2016-01-25 MED ORDER — CITALOPRAM HYDROBROMIDE 20 MG PO TABS: 20.0000 mg | ORAL_TABLET | Freq: Every day | ORAL | Status: DC

## 2016-01-25 MED ORDER — LEVOTHYROXINE SODIUM 75 MCG PO TABS: 75.0000 ug | ORAL_TABLET | Freq: Every day | ORAL | Status: DC

## 2016-01-25 MED ORDER — ATORVASTATIN CALCIUM 40 MG PO TABS: 40.0000 mg | ORAL_TABLET | Freq: Every day | ORAL | Status: DC

## 2016-01-25 MED ORDER — METFORMIN HCL 500 MG PO TABS: ORAL_TABLET | ORAL | Status: DC

## 2016-01-25 MED ORDER — TRIAMCINOLONE ACETONIDE 0.1 % EX CREA: 1.0000 "application " | TOPICAL_CREAM | Freq: Two times a day (BID) | CUTANEOUS | Status: DC

## 2016-01-25 MED ORDER — HYDROXYZINE HCL 25 MG PO TABS: 25.0000 mg | ORAL_TABLET | Freq: Two times a day (BID) | ORAL | Status: DC | PRN

## 2016-01-25 MED ORDER — CLONAZEPAM 0.5 MG PO TABS: ORAL_TABLET | ORAL | Status: DC

## 2016-01-25 MED ORDER — QUETIAPINE FUMARATE 50 MG PO TABS: 25.0000 mg | ORAL_TABLET | Freq: Every day | ORAL | Status: DC

## 2016-01-25 MED ORDER — HYDROCHLOROTHIAZIDE 25 MG PO TABS: 25.0000 mg | ORAL_TABLET | Freq: Every day | ORAL | Status: DC

## 2016-01-25 MED ORDER — SUMATRIPTAN SUCCINATE 100 MG PO TABS: 100.0000 mg | ORAL_TABLET | ORAL | Status: DC

## 2016-01-25 MED ORDER — LEVOCETIRIZINE DIHYDROCHLORIDE 5 MG PO TABS: 5.0000 mg | ORAL_TABLET | Freq: Every evening | ORAL | Status: DC

## 2016-01-25 NOTE — Assessment & Plan Note (Addendum)
Last TSH normal at .73 on 04/21/2015, will recheck today. If normal will space TSH monitoring

## 2016-01-25 NOTE — Assessment & Plan Note (Addendum)
Patient is due for mammogram, and Pap smear - Received Pap smear today - Flu vaccine today - Difficulty ordering mammogram, will order at next visit

## 2016-01-25 NOTE — Progress Notes (Signed)
   Subjective:    Patient ID: Larinda Buttery, female    DOB: February 24, 1951, 65 y.o.   MRN: [id number]   CC: Physical exam  HPI:  65 year old female with history of diabetes presenting for physical exam  Diabetes - A1c 6.2 - Patient denies chest pain and shortness of breath - Takes metformin - Does not report numbness or tingling of hands and feet -Desires refill of medications   Review of Systems ROS Per history of present illness otherwise denies recent illnesses, fevers, headache, changes in vision, nausea, vomiting, diarrhea Past Medical, Surgical, Social, and Family History Reviewed & Updated per EMR.   Objective:  BP 155/71 mmHg  Pulse 63  Temp(Src) 98.3 F (36.8 C) (Oral)  Wt 152 lb (68.947 kg) Vitals and nursing note reviewed  General: NAD Cardiac: RRR,  Respiratory: CTAB, normal effort Extremities: no edema or cyanosis. WWP. See quality metrics for foot exam Skin: warm and dry, no rashes noted    Assessment & Plan:    Diabetes mellitus A1c 6.2. Diabetes well controlled with metformin, last creatinine 0.58 on 04/21/2015 - Continue metformin - Continue to encourage healthy diet and exercise - Foot exam performed today  Healthcare maintenance Patient is due for mammogram, and Pap smear - Received Pap smear today - Flu vaccine today - Difficulty ordering mammogram, will order at next visit  HYPOTHYROIDISM, UNSPECIFIED Last TSH normal at .73, will recheck today. If normal will space TSH monitoring     Nandana Krolikowski A. Lincoln Brigham MD, [facility] Resident PGY-2 Pager [phone]

## 2016-01-25 NOTE — Assessment & Plan Note (Signed)
A1c 6.2. Diabetes well controlled with metformin, last creatinine 0.58 on 04/21/2015 - Continue metformin - Continue to encourage healthy diet and exercise - Foot exam performed today

## 2016-01-25 NOTE — Patient Instructions (Addendum)
Follow-up in 3 months for A1c You had a Pap smear today, you will be called if it's abnormal If you have questions or concerns please call the office at [phone]

## 2016-01-26 LAB — MICROALBUMIN, URINE: Microalb, Ur: 1.2 mg/dL

## 2016-01-26 LAB — CYTOLOGY - PAP

## 2016-03-13 ENCOUNTER — Other Ambulatory Visit: Payer: Self-pay | Admitting: Student

## 2016-03-13 NOTE — Telephone Encounter (Signed)
Need refill for the clonazepam

## 2016-03-14 ENCOUNTER — Telehealth: Payer: Self-pay | Admitting: Student

## 2016-03-14 NOTE — Telephone Encounter (Signed)
Patient's husband asks PCP refill for Clonazepam 0.5 mg ASAP. Please, follow up.

## 2016-03-14 NOTE — Telephone Encounter (Signed)
Patient husband calling again regarding clonazepam refill, states wife just took her last pill today.

## 2016-03-14 NOTE — Telephone Encounter (Signed)
Clonazepam refilled. Pharmacy called to refill

## 2016-03-14 NOTE — Telephone Encounter (Signed)
Message sent to MD. Johnney Ou

## 2016-03-29 ENCOUNTER — Other Ambulatory Visit: Payer: Self-pay | Admitting: Student

## 2016-03-29 NOTE — Telephone Encounter (Signed)
Looks like pt last seen in clinic in March, please advise.

## 2016-04-26 ENCOUNTER — Other Ambulatory Visit: Payer: Self-pay | Admitting: Student

## 2016-05-02 ENCOUNTER — Other Ambulatory Visit: Payer: Self-pay | Admitting: *Deleted

## 2016-05-02 DIAGNOSIS — R739 Hyperglycemia, unspecified: Secondary | ICD-10-CM

## 2016-05-02 MED ORDER — METFORMIN HCL 500 MG PO TABS: ORAL_TABLET | ORAL | Status: DC

## 2016-05-30 ENCOUNTER — Other Ambulatory Visit: Payer: Self-pay | Admitting: *Deleted

## 2016-05-30 DIAGNOSIS — R739 Hyperglycemia, unspecified: Secondary | ICD-10-CM

## 2016-05-30 NOTE — Telephone Encounter (Signed)
Husband called because patient needs a refill on her klonopin and also needs a note dismissing her from jury duty.  Patient has an appt on 06-11-16 but will need the note before this appt if possible. Jariah Jarmon,CMA

## 2016-05-31 NOTE — Telephone Encounter (Signed)
2nd request.  Asuncion Shibata L, RN  

## 2016-06-01 ENCOUNTER — Encounter: Payer: Self-pay | Admitting: *Deleted

## 2016-06-01 ENCOUNTER — Telehealth: Payer: Self-pay | Admitting: Student

## 2016-06-01 MED ORDER — METFORMIN HCL 500 MG PO TABS: ORAL_TABLET | ORAL | Status: DC

## 2016-06-01 MED ORDER — CLONAZEPAM 0.5 MG PO TABS: ORAL_TABLET | ORAL | Status: DC

## 2016-06-01 NOTE — Telephone Encounter (Signed)
Informed pt medications have been refilled, however, wait a few hours to go and get them. Informed pt her letter is up front waiting for her. Page, cma.

## 2016-06-01 NOTE — Telephone Encounter (Signed)
Refill request for metformin done. Clonazepam will be filled once her pharmacy opens at 9. Letter will be left at front

## 2016-06-01 NOTE — Telephone Encounter (Signed)
Klonopin called into pharmacy 

## 2016-06-11 ENCOUNTER — Ambulatory Visit: Payer: Medicare Other | Admitting: Student

## 2016-07-06 ENCOUNTER — Other Ambulatory Visit: Payer: Self-pay | Admitting: Student

## 2016-07-06 DIAGNOSIS — E039 Hypothyroidism, unspecified: Secondary | ICD-10-CM

## 2016-07-09 ENCOUNTER — Other Ambulatory Visit: Payer: Self-pay | Admitting: Student

## 2016-08-13 ENCOUNTER — Other Ambulatory Visit: Payer: Self-pay | Admitting: Student

## 2016-08-13 NOTE — Telephone Encounter (Signed)
Pt left all her medications at a hotel during vacation. She needs refill on all of them.  [facility] [address]

## 2016-08-15 ENCOUNTER — Other Ambulatory Visit: Payer: Self-pay | Admitting: Student

## 2016-08-15 DIAGNOSIS — F4323 Adjustment disorder with mixed anxiety and depressed mood: Secondary | ICD-10-CM

## 2016-08-15 DIAGNOSIS — R739 Hyperglycemia, unspecified: Secondary | ICD-10-CM

## 2016-08-15 MED ORDER — HYDROXYZINE HCL 25 MG PO TABS: 25.0000 mg | ORAL_TABLET | Freq: Two times a day (BID) | ORAL | 1 refills | Status: DC | PRN

## 2016-08-15 MED ORDER — CLONAZEPAM 0.5 MG PO TABS: ORAL_TABLET | ORAL | 3 refills | Status: DC

## 2016-08-15 MED ORDER — TRIAMCINOLONE ACETONIDE 0.1 % EX CREA: TOPICAL_CREAM | CUTANEOUS | 0 refills | Status: DC

## 2016-08-15 MED ORDER — HYDROCHLOROTHIAZIDE 25 MG PO TABS: 25.0000 mg | ORAL_TABLET | Freq: Every day | ORAL | 4 refills | Status: DC

## 2016-08-15 MED ORDER — QUETIAPINE FUMARATE 50 MG PO TABS: 25.0000 mg | ORAL_TABLET | Freq: Every day | ORAL | 3 refills | Status: DC

## 2016-08-15 MED ORDER — SUMATRIPTAN SUCCINATE 100 MG PO TABS: 100.0000 mg | ORAL_TABLET | ORAL | 3 refills | Status: DC

## 2016-08-15 MED ORDER — CITALOPRAM HYDROBROMIDE 20 MG PO TABS: 20.0000 mg | ORAL_TABLET | Freq: Every day | ORAL | 3 refills | Status: DC

## 2016-08-15 MED ORDER — LEVOCETIRIZINE DIHYDROCHLORIDE 5 MG PO TABS: 5.0000 mg | ORAL_TABLET | Freq: Every evening | ORAL | 3 refills | Status: DC

## 2016-08-15 MED ORDER — METFORMIN HCL 500 MG PO TABS: ORAL_TABLET | ORAL | 3 refills | Status: DC

## 2016-08-15 MED ORDER — ATORVASTATIN CALCIUM 40 MG PO TABS: 40.0000 mg | ORAL_TABLET | Freq: Every day | ORAL | 5 refills | Status: DC

## 2016-08-15 MED ORDER — LEVOTHYROXINE SODIUM 75 MCG PO TABS: ORAL_TABLET | ORAL | 3 refills | Status: DC

## 2016-08-15 NOTE — Telephone Encounter (Signed)
Patient's husband asks PCP for all medications refill because patient left them at hotel last weekend. Patient doesn't have any medicine. Please, follow up.

## 2016-09-14 ENCOUNTER — Encounter: Payer: Self-pay | Admitting: Internal Medicine

## 2016-09-14 ENCOUNTER — Ambulatory Visit (INDEPENDENT_AMBULATORY_CARE_PROVIDER_SITE_OTHER): Payer: Medicare Other | Admitting: Internal Medicine

## 2016-09-14 DIAGNOSIS — Z23 Encounter for immunization: Secondary | ICD-10-CM | POA: Diagnosis not present

## 2016-09-14 DIAGNOSIS — M542 Cervicalgia: Secondary | ICD-10-CM

## 2016-09-14 MED ORDER — NAPROXEN 500 MG PO TABS: 500.0000 mg | ORAL_TABLET | Freq: Three times a day (TID) | ORAL | 0 refills | Status: DC

## 2016-09-14 MED ORDER — METAXALONE 400 MG PO TABS: 400.0000 mg | ORAL_TABLET | Freq: Three times a day (TID) | ORAL | 0 refills | Status: DC

## 2016-09-14 NOTE — Patient Instructions (Signed)
It was nice meeting you today Mrs. [place]!  For your back pain, please begin taking Naproxen one tablet three times a day with meals. Take this three times a day for the next five days, then as needed for pain.   You can also begin taking the Skelaxin (muscle relaxer) up to three times a day as needed if your pain is not improved by the Naproxen.   Please perform the exercises listed below at least twice a day, or more if tolerated. It is very important to stay mobile to help decrease inflammation and stiffness in your muscles.   If you have any questions or concerns, please feel free to call the clinic.   Be well,  Dr. Avon Gully  Neck stretch  1. This stretch works best if you keep your shoulder down as you lean away from it. To help you remember to do this, start by relaxing your shoulders and lightly holding on to your thighs or your chair. 2. Tilt your head toward your shoulder and hold for 15 to 30 seconds. 3. If you would like a little added stretch, use your hand to gently and steadily pull your head toward your shoulder. For example, keeping your right shoulder down, lean your head to the left. Let the weight of your head stretch your muscles, or use your left hand to pull gently down on your head. 4. Repeat 2 to 4 times toward each shoulder. Diagonal neck stretch  1. Turn your head slightly toward the direction you will be stretching, and tilt your head diagonally toward your chest and hold for 15 to 30 seconds. 2. If you would like a little added stretch, use your hand to gently and steadily pull your head forward on the diagonal. 3. Repeat 2 to 4 times toward each side.

## 2016-09-14 NOTE — Progress Notes (Signed)
   Subjective:    Patient ID: [name], female    DOB: 1951-07-07, 65 y.o.   MRN: [id number]  HPI  Patient presents for same day appointment for neck pain.   Neck pain Began six days ago. Patient woke up with pain on that day. Thinks that she slept in a funny position the night prior to onset, possibly with her arm up by her head trying to keep her glasses from falling off while reading prior to falling asleep. Since then she has had a lot of stiffness and soreness. Reports pain has been so bad at times that she has cried. Is now having difficulty laying or sitting down. Says pain is getting worse. Reports headaches as well, though has history of migraines for which she takes Imitrex. Says Imitrex is relieving her headaches. Has been using [facility] and ibuprofen for neck pain, both of which have helped somewhat.   Patient is never smoker.  Patient lives at home with her husband Izell [place].   Review of Systems See HPI.     Objective:   Physical Exam  Constitutional: She is oriented to person, place, and time. She appears well-developed and well-nourished. No distress.  HENT:  Head: Normocephalic and atraumatic.  Neck:  Very limited passive ROM in all directions reportedly 2/2 pain. No TTP.   Pulmonary/Chest: Effort normal. No respiratory distress.  Musculoskeletal:  5/5 strength upper extremities bilaterally  Neurological: She is alert and oriented to person, place, and time.  Skin:  No ecchymosis or other skin abnormalities on neck  Psychiatric: She has a normal mood and affect. Her behavior is normal.      Assessment & Plan:  Neck pain Most likely MSK in etiology after sleeping in awkward position. No signs of neuro deficits and symptoms not consistent with neuropathic pain. No signs of trauma.  - Naproxen TID x5d, then PRN - Skelaxin TID PRN, #15 tablets - Provided neck stretching exercises to be performed at least twice daily, more if tolerated  Adin Hector, MD,  MP[facility] PGY-2 [facility] Pager [phone]

## 2016-09-14 NOTE — Assessment & Plan Note (Signed)
Most likely MSK in etiology after sleeping in awkward position. No signs of neuro deficits and symptoms not consistent with neuropathic pain. No signs of trauma.  - Naproxen TID x5d, then PRN - Skelaxin TID PRN, #15 tablets - Provided neck stretching exercises to be performed at least twice daily, more if tolerated

## 2016-11-09 ENCOUNTER — Other Ambulatory Visit: Payer: Self-pay | Admitting: Student

## 2016-11-28 ENCOUNTER — Telehealth: Payer: Self-pay | Admitting: Student

## 2016-11-28 MED ORDER — CLONAZEPAM 0.5 MG PO TABS: ORAL_TABLET | ORAL | 3 refills | Status: DC

## 2016-11-28 NOTE — Telephone Encounter (Signed)
Husband is aware that script is ready for pick up. Katieann Hungate,CMA

## 2016-11-28 NOTE — Telephone Encounter (Signed)
Clonazepam left at front. Please call the patient and inform him

## 2016-11-28 NOTE — Telephone Encounter (Signed)
Husband called again, states pt has been without medication for 3 days and would like to get this refilled today. ep

## 2016-11-28 NOTE — Telephone Encounter (Signed)
Husband called because his wife is out of her clonazepam. She has been out 2 days now can we call this in or leave this up front for pick up. Please let husband know. jw

## 2017-03-02 ENCOUNTER — Other Ambulatory Visit: Payer: Self-pay | Admitting: Student

## 2017-03-28 ENCOUNTER — Other Ambulatory Visit: Payer: Self-pay | Admitting: Student

## 2017-03-28 DIAGNOSIS — E039 Hypothyroidism, unspecified: Secondary | ICD-10-CM

## 2017-07-03 ENCOUNTER — Other Ambulatory Visit: Payer: Self-pay | Admitting: *Deleted

## 2017-07-03 MED ORDER — SUMATRIPTAN SUCCINATE 100 MG PO TABS: 100.0000 mg | ORAL_TABLET | ORAL | 3 refills | Status: DC

## 2017-07-10 ENCOUNTER — Other Ambulatory Visit: Payer: Self-pay | Admitting: *Deleted

## 2017-07-10 DIAGNOSIS — I10 Essential (primary) hypertension: Secondary | ICD-10-CM

## 2017-07-10 MED ORDER — HYDROCHLOROTHIAZIDE 25 MG PO TABS: 25.0000 mg | ORAL_TABLET | Freq: Every day | ORAL | 0 refills | Status: DC

## 2017-07-10 NOTE — Telephone Encounter (Signed)
Husband left message on nurse line requesting refill on "BP med" for patient [name], RN, BSN

## 2017-07-15 ENCOUNTER — Other Ambulatory Visit: Payer: Self-pay | Admitting: *Deleted

## 2017-07-15 DIAGNOSIS — F411 Generalized anxiety disorder: Secondary | ICD-10-CM

## 2017-07-16 MED ORDER — CLONAZEPAM 0.5 MG PO TABS: 0.5000 mg | ORAL_TABLET | Freq: Two times a day (BID) | ORAL | 0 refills | Status: DC | PRN

## 2017-07-16 NOTE — Telephone Encounter (Signed)
Phoned in one refill with additional direction to schedule office visit before next refill. Pharmacist's name: Richard Miu

## 2017-07-16 NOTE — Addendum Note (Signed)
Addended by: Wendee Beavers T on: 07/16/2017 12:31 PM   Modules accepted: Orders

## 2017-07-16 NOTE — Telephone Encounter (Signed)
Spoke to Dr. Cyndia Skeeters. He will call the pt to discuss the medication and making an appt. Ottis Stain, CMA

## 2017-07-16 NOTE — Telephone Encounter (Signed)
Husband is calling because he said that the pharmacy has requested a refill on her Clonazepam for over 8 days with no response from [place]. He is very upset and said that now his wife is out of medication. Please get this done ASAP. Please call husband when this is done. jw

## 2017-07-23 ENCOUNTER — Encounter: Payer: Self-pay | Admitting: Student

## 2017-07-23 ENCOUNTER — Ambulatory Visit (INDEPENDENT_AMBULATORY_CARE_PROVIDER_SITE_OTHER): Payer: Medicare Other | Admitting: Student

## 2017-07-23 VITALS — BP 122/70 | HR 78 | Temp 98.1°F | Ht 64.0 in | Wt 140.6 lb

## 2017-07-23 DIAGNOSIS — E785 Hyperlipidemia, unspecified: Secondary | ICD-10-CM

## 2017-07-23 DIAGNOSIS — Z1159 Encounter for screening for other viral diseases: Secondary | ICD-10-CM | POA: Diagnosis not present

## 2017-07-23 DIAGNOSIS — F411 Generalized anxiety disorder: Secondary | ICD-10-CM | POA: Diagnosis not present

## 2017-07-23 DIAGNOSIS — Z23 Encounter for immunization: Secondary | ICD-10-CM

## 2017-07-23 DIAGNOSIS — F3289 Other specified depressive episodes: Secondary | ICD-10-CM

## 2017-07-23 DIAGNOSIS — R4189 Other symptoms and signs involving cognitive functions and awareness: Secondary | ICD-10-CM

## 2017-07-23 DIAGNOSIS — E119 Type 2 diabetes mellitus without complications: Secondary | ICD-10-CM

## 2017-07-23 DIAGNOSIS — Z79899 Other long term (current) drug therapy: Secondary | ICD-10-CM | POA: Diagnosis not present

## 2017-07-23 DIAGNOSIS — R569 Unspecified convulsions: Secondary | ICD-10-CM | POA: Diagnosis not present

## 2017-07-23 DIAGNOSIS — E039 Hypothyroidism, unspecified: Secondary | ICD-10-CM | POA: Diagnosis not present

## 2017-07-23 DIAGNOSIS — Z0000 Encounter for general adult medical examination without abnormal findings: Secondary | ICD-10-CM

## 2017-07-23 DIAGNOSIS — I10 Essential (primary) hypertension: Secondary | ICD-10-CM | POA: Diagnosis not present

## 2017-07-23 LAB — POCT GLYCOSYLATED HEMOGLOBIN (HGB A1C): Hemoglobin A1C: 5.8

## 2017-07-23 MED ORDER — ZOSTER VAC RECOMB ADJUVANTED 50 MCG/0.5ML IM SUSR: 0.5000 mL | Freq: Once | INTRAMUSCULAR | 1 refills | Status: AC

## 2017-07-23 MED ORDER — TETANUS-DIPHTH-ACELL PERTUSSIS 5-2.5-18.5 LF-MCG/0.5 IM SUSP: 0.5000 mL | Freq: Once | INTRAMUSCULAR | 0 refills | Status: AC

## 2017-07-23 NOTE — Patient Instructions (Addendum)
It was great seeing you today! We have addressed the following issues today 1. Memory problem: Please come back and see [place] in about a month to discuss about this further 2.   Screening for breast cancer: Please call the number we gave you to schedule for your mammogram 3.   Vaccines: Please take the prescriptions we gave you for your tetanus vaccines and shingles vaccine to your pharmacy 4.   Staying well: Please refer to the diet and exercise recommendations below. I also recommend taking women's vitamin with calcium and vitamin D daily.  5.   Advanced directive/living will: Please review the area information paper we gave you for this.   If we did any lab work today, and the results require attention, either me or my nurse will get in touch with you. If everything is normal, you will get a letter in mail and a message via . If you don't hear from [place] in two weeks, please give [place] a call. Otherwise, we look forward to seeing you again at your next visit. If you have any questions or concerns before then, please call the clinic at [phone].  Please bring all your medications to every doctors visit  Sign up for My Chart to have easy access to your labs results, and communication with your Primary care physician.    Please check-out at the front desk before leaving the clinic.   Take Care,   Dr. Cyndia Skeeters  Portion Size    Choose healthier foods such as 100% whole grains, vegetables, fruits, beans, nut seeds, olive oil, most vegetable oils, fat-free dietary, wild game and fish.   Avoid sweet tea, other sweetened beverages, soda, fruit juice, cold cereal and milk and trans fat.   Eat at least 3 meals and 1-2 snacks per day.  Aim for no more than 5 hours between eating.  Eat breakfast within one hour of getting up.    Exercise at least 150 minutes per week, including weight resistance exercises 3 or 4 times per week.

## 2017-07-23 NOTE — Progress Notes (Signed)
Subjective:     [name] is a 66 y.o. old female here  for annual exam.  Concern today: memory issue (chronic). No recent changes. Changes in his/her health in the last 12 months: no Occupation: retired Secretary/administrator Wears seatbelt: yes.    The patient has regular exercise: yes.   Enough vegetables and fruits: yes.  Smokes cigarette: no Drinks EtOH: no Drug use: no Patient takes ASA: yes.  Patient takes vitD & Ca: no. Ever been transfused or tattooed?: no.  The patient is sexually active.  Patient uses birth control: not applicable.  Domestic violence: no.  Advance directive: no. MOST: no.   History of depression:yes.  Patient dental home: yes.  Immunizations  Needs influenza vaccine: yes.  Needs HPV (Women until age 5): not applicable.  Needs Shingrix (all >12yrs of age): yes.  Needs Tdap: yes.  Needs Pneumococcal: yes.  Age ? 65: PCV13 followed by PPSV23 6 to 12 months later. PCV13 after 68yr If already had PPSV23.  Screening Need colon cancer screening: no. Need breast cancer ccreening: yes. Need cervical cancer Screening: no. Need lung cancer screening:not applicable. At risk for skin cancer: no Need HCV Screening: yes. Need STI Screening: no.  HPI  PMH/Problem List: has Hypothyroidism; Hyperlipidemia; Anxiety state; Cognitive impairment; Depression; Seizure ([place]); Migraine headache; HYPERTENSION, BENIGN SYSTEMIC; DJD, UNSPECIFIED; SLEEP DISORDER; Onychomycosis; Bunion; Hx of adenomatous colonic polyps; Diabetes mellitus ([place]); Annual physical exam; and Neck pain on her problem list.   has a past medical history of Anxiety; Diabetes mellitus without complication ([place]); adenomatous colonic polyps (01/26/2015); Memory loss of unknown cause (03/04/2002); Seizures ([place]); and Thyroid disease.  [facility]  Family History  Problem Relation Age of Onset  . Colon cancer Neg Hx   . Rectal cancer Neg Hx   . Stomach cancer Neg Hx    Family history of heart disease before age  of 24 yrs: sister with heart attack in her 75's. Family history of stroke: father with stroke in his 61's. Family history of cancer: brother with prostate cancer.[name] Social History  Substance Use Topics  . Smoking status: Never Smoker  . Smokeless tobacco: Never Used  . Alcohol use No     Review of Systems  Constitutional: Positive for fatigue. Negative for appetite change, diaphoresis, fever and unexpected weight change.  HENT: Negative for dental problem and trouble swallowing.   Eyes: Negative for visual disturbance.  Respiratory: Negative for cough, chest tightness and shortness of breath.   Cardiovascular: Negative for chest pain, palpitations and leg swelling.  Gastrointestinal: Negative for abdominal pain and blood in stool.  Endocrine: Negative for cold intolerance and heat intolerance.  Genitourinary: Negative for dysuria, hematuria and pelvic pain.  Musculoskeletal: Negative for arthralgias and myalgias.  Skin: Negative for rash.  Neurological: Positive for light-headedness.  Hematological: Negative for adenopathy. Does not bruise/bleed easily.  Psychiatric/Behavioral: Positive for decreased concentration and dysphoric mood.       "good"       Objective:   Physical Exam Vitals:   07/23/17 1137  BP: 122/70  Pulse: 78  Temp: 98.1 F (36.7 C)  TempSrc: Oral  SpO2: 97%  Weight: 140 lb 9.6 oz (63.8 kg)  Height: 5\' 4"  (1.626 m)   Body mass index is 24.13 kg/m.  GEN: appears well, no apparent distress. She frequently turns to her husband to answer questions.  Head: normocephalic and atraumatic  Eyes: conjunctiva without injection, sclera anicteric Ears: external ear and ear canal normal Nares: no rhinorrhea,  congestion or erythema Oropharynx: mmm without erythema or exudation HEM: negative for cervical or periauricular lymphadenopathies CVS: RRR, nl s1 & s2, no murmurs, no edema,  2+ DP & PT bil RESP: no IWOB, good air movement bilaterally, CTAB GI: BS  present & normal, soft, NTND, no guarding, no rebound, no mass GU: no suprapubic or CVA tenderness MSK: no focal tenderness or notable swelling SKIN: no apparent skin lesion ENDO: negative thyromegally NEURO: Alert and awake. Oriented to self, place, person but not to date, day, month or year. Not able to verify her medications without a husbands help. Otherwise, no gross deficits  PSYCH:  Neatly groomed and appropriately dressed. Maintains good eye contact and is cooperative and attentive. Speech is normal volume and rate. Mood is depressed with a mildly restricted affect. Thought process is logical and goal directed. No suicidal or homicidal ideation. Does not appear to be responding to any internal stimuli. Able to maintain train of thought and concentrate on the questions. Cognitive ability is below average.        Depression screen [facility] 2/9 07/23/2017 07/23/2017 04/21/2015 10/20/2014 10/05/2013  Decreased Interest 2 0 0 0 3  Down, Depressed, Hopeless 2 0 0 0 0  PHQ - 2 Score 4 0 0 0 3  Altered sleeping 2 - - - -  Tired, decreased energy 2 - - - -  Change in appetite 3 - - - -  Feeling bad or failure about yourself  0 - - - -  Trouble concentrating 3 - - - -  Moving slowly or fidgety/restless 0 - - - -  Suicidal thoughts 0 - - - -  PHQ-9 Score 14 - - - -  Difficult doing work/chores Somewhat difficult - - - -    Assessment & Plan:  1. Annual physical exam - Recommended getting her mammogram as soon as possible. - Zoster Vac Recomb Adjuvanted [facility]) injection; Inject 0.5 mLs into the muscle once. Repeat dose in 2 to 6 months.  Dispense: 0.5 mL; Refill: 1 - Tdap (BOOSTRIX) 5-2.5-18.5 LF-MCG/0.5 injection; Inject 0.5 mLs into the muscle once.  Dispense: 0.5 mL; Refill: 0 - Unfortunately she did not receive a PSV-13 today. She had PPCV-23 4 years ago. She needs a PSV-13 followed by PPCV-23 in a year.  - Screening for hepatitis C. - We'll discuss about DEXA scan at next visit. -  Patient to return in a month to discuss about cognitive impairment - Encouraged her to follow-up with our nurse for annual wellness visit  2. Type 2 diabetes mellitus without complication, without long-term current use of insulin (HCC) -Well-controlled. A1c down from 6.2% to 5.8%. She is not on any medication. - Microalbumin/Creatinine Ratio, Urine  3. HYPERTENSION, BENIGN SYSTEMIC -Well-controlled on hydrochlorothiazide 25 mg daily. - Basic metabolic panel  4. Hypothyroidism, unspecified type: Denies symptoms of hypo-or hyperthyroidism. Not sure what dose of Synthroid she is taking.  - TSH - Recommended bringing her medication bottles to next visit.  5. Anxiety state: GAD-7: 21. She marked 3 for all questionnaire. Not sure about the validity of her response -We will continue his Celexa and Klonopin. However, we may have to discuss about the impact of Klonopin given history of cognitive impairment.   6. Other depression: Stable. PHQ-9: 14. No previous PHQ-9 to compare to. No suicidal or homicidal ideation -Continue Celexa.  7. Seizure ([place]) - Levetiracetam level per recommendation by her neurologist.  8. Cognitive impairment: Patient was not able to tell me the date, day,  month or year. They at times when she got lost in the neighborhood.  -Recommended return to clinic for formal evaluation -We'll discuss about Klonopin at that visit -We will check vitamin B12 level.   9. Hyperlipidemia, unspecified hyperlipidemia type -Continue atorvastatin 40 mg daily - Lipid panel  10. Encounter for hepatitis C screening test for low risk patient - Hepatitis C antibody  Wendee Beavers PGY-3 Pager [phone] 07/24/17  2:35 PM

## 2017-07-24 DIAGNOSIS — Z23 Encounter for immunization: Secondary | ICD-10-CM | POA: Diagnosis not present

## 2017-07-24 NOTE — Assessment & Plan Note (Signed)
-   Recommended getting her mammogram as soon as possible. - Zoster Vac Recomb Adjuvanted [facility]) injection; Inject 0.5 mLs into the muscle once. Repeat dose in 2 to 6 months.  Dispense: 0.5 mL; Refill: 1 - Tdap (BOOSTRIX) 5-2.5-18.5 LF-MCG/0.5 injection; Inject 0.5 mLs into the muscle once.  Dispense: 0.5 mL; Refill: 0 - Unfortunately she did not receive a PSV-13 today. She had PPCV-23 4 years ago. She needs a PSV-13 followed by PPCV-23 in a year.  - Screening for hepatitis C. - We'll discuss about DEXA scan at next visit. - Patient to return in a month to discuss about cognitive impairment - Encouraged her to follow-up with our nurse for annual wellness visit

## 2017-07-24 NOTE — Assessment & Plan Note (Signed)
GAD-7: 21. She marked 3 for all questionnaire. Not sure about the validity of her response -We will continue his Celexa and Klonopin. However, we may have to discuss about the impact of Klonopin given history of cognitive impairment.

## 2017-07-24 NOTE — Assessment & Plan Note (Signed)
Stable. PHQ-9: 14. No previous PHQ-9 to compare to. No suicidal or homicidal ideation -Continue Celex

## 2017-07-24 NOTE — Assessment & Plan Note (Signed)
-  Well-controlled on hydrochlorothiazide 25 mg daily. - Basic metabolic panel

## 2017-07-24 NOTE — Addendum Note (Signed)
Addended by: Londell Moh T on: 07/24/2017 03:50 PM   Modules accepted: Orders, SmartSet

## 2017-07-24 NOTE — Assessment & Plan Note (Signed)
Denies symptoms of hypo-or hyperthyroidism. Not sure what dose of Synthroid she is taking.  - TSH - Recommended bringing her medication bottles to next visit.

## 2017-07-24 NOTE — Assessment & Plan Note (Signed)
Patient was not able to tell me the date, day, month or year. They at times when she got lost in the neighborhood.  -Recommended return to clinic for formal evaluation -We'll discuss about Klonopin at that visit -We will check vitamin B12 level.

## 2017-07-25 LAB — BASIC METABOLIC PANEL
BUN/Creatinine Ratio: 19 (ref 12–28)
BUN: 11 mg/dL (ref 8–27)
CO2: 24 mmol/L (ref 20–29)
Calcium: 10 mg/dL (ref 8.7–10.3)
Chloride: 96 mmol/L (ref 96–106)
Creatinine, Ser: 0.57 mg/dL (ref 0.57–1.00)
GFR calc Af Amer: 112 mL/min/{1.73_m2} (ref >59)
GFR calc non Af Amer: 97 mL/min/{1.73_m2} (ref >59)
Glucose: 84 mg/dL (ref 65–99)
Potassium: 4.5 mmol/L (ref 3.5–5.2)
Sodium: 139 mmol/L (ref 134–144)

## 2017-07-25 LAB — HEPATITIS C ANTIBODY: Hep C Virus Ab: <0.1 s/co ratio (ref 0.0–0.9)

## 2017-07-25 LAB — LIPID PANEL
Chol/HDL Ratio: 2.2 ratio (ref 0.0–4.4)
Cholesterol, Total: 154 mg/dL (ref 100–199)
HDL: 70 mg/dL (ref >39)
LDL Calculated: 71 mg/dL (ref 0–99)
Triglycerides: 63 mg/dL (ref 0–149)
VLDL Cholesterol Cal: 13 mg/dL (ref 5–40)

## 2017-07-25 LAB — MICROALBUMIN / CREATININE URINE RATIO
Creatinine, Urine: 36.5 mg/dL
Microalb/Creat Ratio: 17.5 mg/g creat (ref 0.0–30.0)
Microalbumin, Urine: 6.4 ug/mL

## 2017-07-25 LAB — TSH: TSH: 0.417 u[IU]/mL — ABNORMAL LOW (ref 0.450–4.500)

## 2017-07-25 LAB — VITAMIN B12: Vitamin B-12: 610 pg/mL (ref 232–1245)

## 2017-07-25 LAB — LEVETIRACETAM LEVEL: Levetiracetam Lvl: 79.9 ug/mL — ABNORMAL HIGH (ref 10.0–40.0)

## 2017-07-26 ENCOUNTER — Other Ambulatory Visit: Payer: Self-pay | Admitting: Student

## 2017-07-26 ENCOUNTER — Encounter: Payer: Self-pay | Admitting: Student

## 2017-07-26 MED ORDER — LEVETIRACETAM 500 MG PO TABS: 1500.0000 mg | ORAL_TABLET | Freq: Two times a day (BID) | ORAL | 11 refills | Status: AC

## 2017-07-26 NOTE — Progress Notes (Signed)
Blood work within normal limit except for elevated Keprra level to 80. Discussed this with patient's husband over the phone and recommended calling calling her neurologist at [facility]. He voiced understanding and agree to the plan. Result letter routed to admin for mail out.

## 2017-07-30 ENCOUNTER — Ambulatory Visit: Payer: Medicare Other | Admitting: *Deleted

## 2017-08-06 ENCOUNTER — Other Ambulatory Visit: Payer: Self-pay | Admitting: Student

## 2017-08-06 DIAGNOSIS — I10 Essential (primary) hypertension: Secondary | ICD-10-CM

## 2017-08-20 ENCOUNTER — Other Ambulatory Visit: Payer: Self-pay | Admitting: *Deleted

## 2017-08-20 DIAGNOSIS — R739 Hyperglycemia, unspecified: Secondary | ICD-10-CM

## 2017-08-22 ENCOUNTER — Ambulatory Visit (INDEPENDENT_AMBULATORY_CARE_PROVIDER_SITE_OTHER): Payer: Medicare Other | Admitting: Student

## 2017-08-22 ENCOUNTER — Encounter: Payer: Self-pay | Admitting: Student

## 2017-08-22 ENCOUNTER — Encounter: Payer: Self-pay | Admitting: Licensed Clinical Social Worker

## 2017-08-22 VITALS — BP 132/76 | HR 57 | Temp 98.3°F | Ht 64.0 in | Wt 139.6 lb

## 2017-08-22 DIAGNOSIS — E039 Hypothyroidism, unspecified: Secondary | ICD-10-CM

## 2017-08-22 DIAGNOSIS — R4189 Other symptoms and signs involving cognitive functions and awareness: Secondary | ICD-10-CM | POA: Diagnosis present

## 2017-08-22 DIAGNOSIS — F3289 Other specified depressive episodes: Secondary | ICD-10-CM | POA: Diagnosis not present

## 2017-08-22 DIAGNOSIS — Z79899 Other long term (current) drug therapy: Secondary | ICD-10-CM

## 2017-08-22 DIAGNOSIS — F411 Generalized anxiety disorder: Secondary | ICD-10-CM | POA: Diagnosis not present

## 2017-08-22 MED ORDER — LEVOTHYROXINE SODIUM 75 MCG PO TABS: ORAL_TABLET | ORAL | 3 refills | Status: DC

## 2017-08-22 MED ORDER — ASPIRIN EC 81 MG PO TBEC: 81.0000 mg | DELAYED_RELEASE_TABLET | Freq: Every day | ORAL | 0 refills | Status: DC

## 2017-08-22 NOTE — Progress Notes (Signed)
ESTIMATE TIME:15 minutes Type of Service: [place] warm handoff  Interpreter:No.    SUBJECTIVE: [name] is a 66 y.o. female referred by Dr. Cyndia Skeeters for: assistance with relaxation techniques.  Patient currently experiencing  symptoms of anxiety, depression and memory loss. Patient was talkative and engaged in conversation, reports her main concerns is adjusting to her memory loss.  She wa accompanied by her husband of 23 years. Patient states her husband is a great support system and helps her try to relax.  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 INTERVENTION:  Mindfulness or Relaxation Training, and Reflective listening,  ISSUES DISCUSSED: previous therapy and effectivness ,coping skills, relaxation tech, and support system  ASSESSMENT:Patient currently experiencing symptoms of depression (PHQ-9=14 and anxiety( GAD 23).  Symptoms exacerbated by patient's loss of memory and difficulty with managing her medical conditions. Patient may benefit from, and is in agreement to implement relaxation techniques discussed and demonstrated today to assist with managing her symptoms. Patient declined f/u appointment and f/u phone call from LCSW.  Patient would like to f/u with LCSW when she returns to for visit with PCP.   PLAN:   1.Patient will F/U with LCSW joint visit with PCP  2. Behavioral recommendations: relaxed breathing  3. Referral:none at this time,   Warm Hand Off Completed.     Casimer Lanius, LCSW Licensed Clinical Social Worker [facility]   [phone] 3:55 PM

## 2017-08-22 NOTE — Progress Notes (Signed)
Husband agrees with her scoring

## 2017-08-22 NOTE — Progress Notes (Signed)
CC: Memory loss Patient arrives in good mood with her husband for this visit. History was provided by patient and husband, about 50-50.  HPI: patient is a former Secretary/administrator. Patient was hospitalized in 02/2002 with memory impairment following a febrile illness, "encephalitis". Initial impression was depression with psychotic features. She had inconsistent ability to recall long-term memories and for many memories. She has been followed by a neurologist at [facility]. She had extensive workup including MRI and genetic testing which were negative. Per her husband, he memory status has been stable since the initial memory loss. She is still have problems forming new memories. She handles all her ADLs and some of her IADLs including cooking, house work and driving. There are times when she find it hard to drive back home. When this happens, she usually calls her husband who redirects her. Her husband helps with medications and finances. She use diary and some reminders. For instance, she writes the date, day and month on the back of her hand everyday. She reads and play word puzzle everyday. He denies history of fall. She has no physical disability. She hasn't been on medication for memory issues.  Significant past medical history Patient also had history of depression and anxiety. She is on Celexa and Klonopin. She also history of hypothyroidism. She is on Synthroid. Husband reports having issue with Synthroid refill. As a result, she did not take her synthroid for the last 2 weeks.  Patient also has history of seizure. She is on Keppra 1500 mg twice a day. She is followed at [facility] neurology office.  Significant social history: she lives with her husband who is here with her today. Denies smoking, drinking or recreational drug use. She is a former Secretary/administrator.  Outpatient Encounter Prescriptions as of 08/22/2017  Medication Sig  . aspirin EC 81 MG tablet Take 1 tablet (81 mg total) by mouth daily.  Marland Kitchen  atorvastatin (LIPITOR) 40 MG tablet Take 1 tablet (40 mg total) by mouth daily at 6 PM.  . citalopram (CELEXA) 20 MG tablet Take 1 tablet (20 mg total) by mouth daily.  . clonazePAM (KLONOPIN) 0.5 MG tablet Take 1 tablet (0.5 mg total) by mouth 2 (two) times daily as needed for anxiety. Please schedule office visit!  Marland Kitchen hydrochlorothiazide (HYDRODIURIL) 25 MG tablet Take 1 tablet (25 mg total) by mouth daily.  . hydrOXYzine (ATARAX/VISTARIL) 25 MG tablet Take 1 tablet (25 mg total) by mouth 2 (two) times daily as needed for itching.  . levETIRAcetam (KEPPRA) 500 MG tablet Take 3 tablets (1,500 mg total) by mouth 2 (two) times daily.  Marland Kitchen levothyroxine (SYNTHROID, LEVOTHROID) 75 MCG tablet TAKE ONE TABLET BY MOUTH DAILY . FOR HYPOTHYROIDISM  . QUEtiapine (SEROQUEL) 50 MG tablet Take 0.5 tablets (25 mg total) by mouth at bedtime.  . SUMAtriptan (IMITREX) 100 MG tablet Take 1 tablet (100 mg total) by mouth as directed. Please schedule office visit!  Marland Kitchen triamcinolone cream (KENALOG) 0.1 % APPLY ONE APPLICATION TOPICALLY TWICE DAILY  . [DISCONTINUED] lamoTRIgine (LAMICTAL) 100 MG tablet Take by mouth.  . [DISCONTINUED] levocetirizine (XYZAL) 5 MG tablet Take 1 tablet (5 mg total) by mouth every evening.  . [DISCONTINUED] levothyroxine (SYNTHROID, LEVOTHROID) 75 MCG tablet TAKE ONE TABLET BY MOUTH DAILY . FOR HYPOTHYROIDISM  . [DISCONTINUED] levothyroxine (SYNTHROID, LEVOTHROID) 75 MCG tablet TAKE ONE TABLET BY MOUTH DAILY FOR  HYPOTHYROIDISM  . [DISCONTINUED] metaxalone (SKELAXIN) 400 MG tablet Take 1 tablet (400 mg total) by mouth 3 (three) times daily.  . [  DISCONTINUED] metFORMIN (GLUCOPHAGE) 500 MG tablet Take 1 tablet by mouth daily for 2 weeks then increase to 1 tablet by mouth twice a day.  . [DISCONTINUED] naproxen (NAPROSYN) 500 MG tablet Take 1 tablet (500 mg total) by mouth 3 (three) times daily with meals.   No facility-administered encounter medications on file as of 08/22/2017.       History Past Medical History:  Diagnosis Date  . Anxiety   . Diabetes mellitus without complication ([place])    on metformin  . Hx of adenomatous colonic polyps 01/26/2015  . Memory loss of unknown cause 03/04/2002   suspect depression with psychotic features. patient hospitalized and work-up negative.   . Seizures ([place])    last seizure 4 months ago  . Thyroid disease    thyroid surgery when young   Past Surgical History:  Procedure Laterality Date  . BUNIONECTOMY    . THYROIDECTOMY, PARTIAL    . TONSILLECTOMY     Family History  Problem Relation Age of Onset  . Colon cancer Neg Hx   . Rectal cancer Neg Hx   . Stomach cancer Neg Hx     reports that she has never smoked. She has never used smokeless tobacco. She reports that she does not drink alcohol or use drugs.   Diet:  general Supplemental shakes: none    Vitals:   08/22/17 1340  BP: 132/76  Pulse: (!) 57  Temp: 98.3 F (36.8 C)  SpO2: 98%   Filed Weights   08/22/17 1340  Weight: 139 lb 9.6 oz (63.3 kg)    Review of Systems Review of Systems  Constitutional: Negative for weight loss.  HENT: Negative for hearing loss and sore throat.   Eyes: Negative for blurred vision and double vision.  Respiratory: Negative for cough, hemoptysis and shortness of breath.   Cardiovascular: Negative for chest pain and leg swelling.  Gastrointestinal: Negative for abdominal pain, constipation and diarrhea.  Genitourinary: Negative for dysuria, hematuria and urgency.  Musculoskeletal: Positive for joint pain. Negative for myalgias.       After ankle surgery for bunion. Not limiting  Neurological: Positive for seizures and headaches. Negative for dizziness, sensory change, speech change and weakness.       History of migraine  Endo/Heme/Allergies: Does not bruise/bleed easily.  Psychiatric/Behavioral: Positive for depression and memory loss. Negative for substance abuse. The patient is not nervous/anxious and does not have  insomnia.    PHYSICAL EXAM: BP 132/76   Pulse (!) 57   Temp 98.3 F (36.8 C) (Oral)   Ht 5\' 4"  (1.626 m)   Wt 139 lb 9.6 oz (63.3 kg)   SpO2 98%   BMI 23.96 kg/m   GEN: appears well, no apparent distress. Head: normocephalic and atraumatic  Eyes: conjunctiva without injection, sclera anicteric Ears: external ear and ear canal normal Nares: no rhinorrhea, congestion or erythema Oropharynx: mmm without erythema or exudation HEM: negative for cervical or periauricular lymphadenopathies CVS: RRR, nl s1 & s2, no murmurs, no edema,  2+ DP & PT pulses bilaterally RESP: no IWOB, good air movement bilaterally, CTAB GI: BS present & normal, soft, NTND GU: no suprapubic or CVA tenderness MSK: no focal tenderness or notable swelling SKIN: no apparent skin lesion ENDO: negative thyromegally NEURO: alert and oiented to self, person, place, season and year but not date, day, month. Motor 5/5 in all extremities. Light sensation intact in all dermatomes. Patellar reflex 1+ bilaterally. Finger-to-nose within normal range. PSYCH: neatly groomed and appropriately dressed.  Maintains good eye contact and is cooperative and attentive. Speech is normal volume and rate. Mood is depressed with a mildly restricted affect. Thought process is logical and goal directed. No suicidal or homicidal ideation. Does not appear to be responding to any internal stimuli. See MMSE for more.   MMSE:   MMSE - Mini Mental State Exam 08/22/2017 03/30/2013  Orientation to time 2 4  Orientation to time comments oriented to year and season -  Orientation to Place 5 5  Registration 3 3  Attention/ Calculation 5 4  Attention/Calculation-comments wasn't able to count back but was able to spell world backward -  Recall 0 2  Language- name 2 objects 2 2  Language- repeat 1 1  Language- follow 3 step command 3 3  Language- read & follow direction 1 1  Write a sentence 1 1  Copy design 1 1  Total score 24 27   Depression  screen PHQ 2/9 08/22/2017 07/23/2017 07/23/2017 04/21/2015 10/20/2014  Decreased Interest 2 2 0 0 0  Down, Depressed, Hopeless 3 2 0 0 0  PHQ - 2 Score 5 4 0 0 0  Altered sleeping 0 2 - - -  Tired, decreased energy 3 2 - - -  Change in appetite 3 3 - - -  Feeling bad or failure about yourself  0 0 - - -  Trouble concentrating 3 3 - - -  Moving slowly or fidgety/restless 0 0 - - -  Suicidal thoughts 0 0 - - -  PHQ-9 Score 14 14 - - -  Difficult doing work/chores Somewhat difficult Somewhat difficult - - -   GAD 7 : Generalized Anxiety Score 08/22/2017 07/23/2017  Nervous, Anxious, on Edge 3 3  Control/stop worrying 3 3  Worry too much - different things 3 3  Trouble relaxing 3 3  Restless 3 3  Easily annoyed or irritable 3 3  Afraid - awful might happen 0 3  Total GAD 7 Score 18 21  Anxiety Difficulty Somewhat difficult Somewhat difficult  Husband agrees with scoring of her GAD-7   Assessment and Plan:   1. Cognitive impairment: stable. She scored 24 on MMSE today. Her score was 27 about 4 years ago. She still handles all her ADLS and  some of IADLs such as cooking, housework and driving although she had times when she couldn't find find her way back home. Her husband states she usually calls him when this happens and he redirects her. She is also followed by neurology at [facility].  2. Other depression: PHQ9 continues to be high but stable at 14. I think part of her depression is due to her memory loss.  She is on citalopram 20 mg daily. She has been on this medication for long time. It is not clear to me if this medication is working as there is not significant change in her PHQ-9 from 4 years ago and last month. She denies SI/HI. -Continue citalopram. We may consider trying another SSRI. -Referral to [facility]  3. Anxiety state: her GAD-7 is high at 24 but improved from prior about two weeks ago. Husband agrees with her scoring. He is on citalopram and Klonopin. She has some coping mechanisms such  as listening to music. She is interested in talking to our [facility] clinician for more information about coping mechanisms.  -Continue citalopram and clonazepam -Referral to [facility] -Follow-up on this in 6 weeks when she returns for a thyroid check  4. Hypothyroidism, unspecified type: she has  been out of her medication for the last 2 weeks. Husband reports having difficulty getting her thyroid medication refilled. I do not see any request from pharmacy on this medication in the last one month. I refilled her Synthroid today. I advised them to call the clinic directly if they have trouble getting her medications refilled in the future. - TSH - T4, Free  5. Encounter for medication review: husband brought all her medication bottles to this visit. Medication list reviewed and updated in Epic.  Wendee Beavers PGY-3 Pager [phone] 08/22/17  6:41 PM

## 2017-08-22 NOTE — Patient Instructions (Signed)
It was great seeing you today! We are checking your thyroid levels today. We may need to recheck this level again in 6 weeks.  In terms of depression and anxiety, continue use citalopram and Klonopin.   If we did any lab work today, and the results require attention, either me or my nurse will get in touch with you. If everything is normal, you will get a letter in mail and a message via . If you don't hear from [place] in two weeks, please give [place] a call. Otherwise, we look forward to seeing you again at your next visit. If you have any questions or concerns before then, please call the clinic at [phone].  Please bring all your medications to every doctors visit  Sign up for My Chart to have easy access to your labs results, and communication with your Primary care physician.    Please check-out at the front desk before leaving the clinic.    Take Care,   Dr. Cyndia Skeeters

## 2017-08-23 LAB — T4, FREE: Free T4: 0.61 ng/dL — ABNORMAL LOW (ref 0.82–1.77)

## 2017-08-23 LAB — TSH: TSH: 12.45 u[IU]/mL — ABNORMAL HIGH (ref 0.450–4.500)

## 2017-09-09 ENCOUNTER — Other Ambulatory Visit: Payer: Self-pay | Admitting: Student

## 2017-09-09 DIAGNOSIS — F411 Generalized anxiety disorder: Secondary | ICD-10-CM

## 2017-09-09 DIAGNOSIS — G43909 Migraine, unspecified, not intractable, without status migrainosus: Secondary | ICD-10-CM

## 2017-09-09 DIAGNOSIS — E039 Hypothyroidism, unspecified: Secondary | ICD-10-CM

## 2017-09-09 DIAGNOSIS — E785 Hyperlipidemia, unspecified: Secondary | ICD-10-CM

## 2017-09-09 MED ORDER — LEVOTHYROXINE SODIUM 75 MCG PO TABS: ORAL_TABLET | ORAL | 3 refills | Status: DC

## 2017-09-09 MED ORDER — CLONAZEPAM 0.5 MG PO TABS: 0.5000 mg | ORAL_TABLET | Freq: Two times a day (BID) | ORAL | 5 refills | Status: DC | PRN

## 2017-09-09 MED ORDER — ATORVASTATIN CALCIUM 40 MG PO TABS: 40.0000 mg | ORAL_TABLET | Freq: Every day | ORAL | 3 refills | Status: DC

## 2017-09-09 MED ORDER — SUMATRIPTAN SUCCINATE 100 MG PO TABS: 100.0000 mg | ORAL_TABLET | ORAL | 3 refills | Status: DC

## 2017-09-09 NOTE — Telephone Encounter (Signed)
came to office, pharmacy stated they sent request over week ago.   Name of Medication(s):  Sumatriptan, Clonazaepam, Atorvastain and Levothyroxin Last date of OV:  07/23/17 Pharmacy:  [facility]  Will route refill request to Clinic RN.  Discussed with patient policy to call pharmacy for future refills.  Also, discussed refills may take up to 48 hours to approve or deny.  [name]

## 2017-09-26 ENCOUNTER — Ambulatory Visit (INDEPENDENT_AMBULATORY_CARE_PROVIDER_SITE_OTHER): Payer: Medicare Other | Admitting: Student

## 2017-09-26 ENCOUNTER — Encounter: Payer: Self-pay | Admitting: Student

## 2017-09-26 ENCOUNTER — Other Ambulatory Visit: Payer: Self-pay | Admitting: Student

## 2017-09-26 VITALS — BP 118/60 | HR 54 | Temp 98.1°F | Ht 64.0 in | Wt 145.8 lb

## 2017-09-26 DIAGNOSIS — G479 Sleep disorder, unspecified: Secondary | ICD-10-CM

## 2017-09-26 DIAGNOSIS — E039 Hypothyroidism, unspecified: Secondary | ICD-10-CM

## 2017-09-26 DIAGNOSIS — Z139 Encounter for screening, unspecified: Secondary | ICD-10-CM

## 2017-09-26 DIAGNOSIS — E2839 Other primary ovarian failure: Secondary | ICD-10-CM | POA: Diagnosis not present

## 2017-09-26 DIAGNOSIS — Z23 Encounter for immunization: Secondary | ICD-10-CM | POA: Diagnosis not present

## 2017-09-26 DIAGNOSIS — Z1231 Encounter for screening mammogram for malignant neoplasm of breast: Secondary | ICD-10-CM | POA: Diagnosis not present

## 2017-09-26 DIAGNOSIS — Z1239 Encounter for other screening for malignant neoplasm of breast: Secondary | ICD-10-CM

## 2017-09-26 MED ORDER — TETANUS-DIPHTH-ACELL PERTUSSIS 5-2.5-18.5 LF-MCG/0.5 IM SUSP: 0.5000 mL | Freq: Once | INTRAMUSCULAR | 0 refills | Status: AC

## 2017-09-26 MED ORDER — QUETIAPINE FUMARATE 50 MG PO TABS: 25.0000 mg | ORAL_TABLET | Freq: Every day | ORAL | 3 refills | Status: DC

## 2017-09-26 NOTE — Progress Notes (Signed)
Subjective:    [name] is a 66 y.o. old female here for follow-up on his thyroid.  She is here with her daughter.  HPI Hypothyroidism: patient had levothyroxine 50 mcg and 75 mcg listed in her medication list when I first met her on July 23, 2017.  At that time, she did not bring her medication bottles.  Both patient and her husband were not clear which Synthroid dose she was on.  TSH was slightly low at 0.417.  Follow-up visit on August 22, 2017, she brought all her medication bottles but realized that she was out of her levothyroxine for 2 weeks.  TSH and free T4 were 12.45 and 0.6 respectively.  We started levothyroxine 75 micrograms daily.  She reports good compliance with his medication.  She denies symptoms of hyper or hypothyroidism.  Program with refills: Patient reports having a problem with his Seroquel refilled.  She says she was told by her pharmacist that the doctor's office is not authorizing her refills.  I showed patient and her daughter about most recent refill requests over the phone to our office about 2 weeks ago on 4 medications which did not include Seroquel.   PMH/Problem List: has Hypothyroidism; Hyperlipidemia; Anxiety state; Cognitive impairment; Depression; Seizure ([place]); Migraine headache; HYPERTENSION, BENIGN SYSTEMIC; DJD, UNSPECIFIED; Disturbance in sleep behavior; Onychomycosis; Bunion; Hx of adenomatous colonic polyps; Diabetes mellitus ([place]); Annual physical exam; and Neck pain on her problem list.   has a past medical history of Anxiety; Diabetes mellitus without complication ([place]); adenomatous colonic polyps (01/26/2015); Memory loss of unknown cause (03/04/2002); Seizures ([place]); and Thyroid disease.  FH:  Family History  Problem Relation Age of Onset  . Colon cancer Neg Hx   . Rectal cancer Neg Hx   . Stomach cancer Neg Hx     SH Social History  Substance Use Topics  . Smoking status: Never Smoker  . Smokeless tobacco: Never Used  . Alcohol use No     Review of Systems Review of systems negative except for pertinent positives and negatives in history of present illness above.     Objective:     Vitals:   09/26/17 1416  BP: 118/60  Pulse: (!) 54  Temp: 98.1 F (36.7 C)  TempSrc: Oral  SpO2: 98%  Weight: 145 lb 12.8 oz (66.1 kg)  Height: '5\' 4"'$  (1.626 m)   Body mass index is 25.03 kg/m.  Physical Exam GEN: appears well, no apparent distress. HEM: negative for cervical or periauricular lymphadenopathies ENDO: negative thyromegally CVS: 60/min, RR, nl S1&S2, no murmurs, no edema RESP: no IWOB, good air movement bilaterally, CTAB MSK: no focal tenderness or notable swelling SKIN: no apparent skin lesion NEURO: alert and oiented appropriately, no gross deficits  PSYCH: euthymic mood with congruent affect    Assessment and Plan:  1. Hypothyroidism, unspecified type: No symptoms of hypo-or hyperthyroidism.  Slightly bradycardic today but not symptomatic.  Reports good compliance with her levothyroxine. - TSH  2. Disturbance in sleep behavior: On Seroquel 25 mg daily.  Has been out of this medication for a while.  Refilled his Seroquel today.  Patient can request refills through the pharmacy but can call our office if she has problems getting her medications refilled within 48 hours.  3. Estrogen deficiency/screening for osteoporosis: No history of fracture. - DG Bone Density; Future  4.  Screening for breast cancer: scheduled with DEXA scan breast center  5. Encounter for immunization - Tdap ([place]) 5-2.5-18.5 LF-MCG/0.5 injection; Inject 0.5 mLs into  the muscle once.  Dispense: 0.5 mL; Refill: 0  Return in about 6 months (around 03/26/2018) for HTN and Thyriod issue.  Mercy Riding, MD 09/26/17 Pager: [phone]

## 2017-09-26 NOTE — Patient Instructions (Addendum)
It was great seeing you today! We have addressed the following issues today  1. Screening for osteoporosis: We have ordered a referral for this.  Someone will get in touch with you over the next couple of weeks. 2. Screening for breast cancer: Please call the number we gave you to schedule for this. 3. Low thyroid: We are checking your blood today. 4. Sleep: I have refilled your Seroquel today.   If we did any lab work today, and the results require attention, either me or my nurse will get in touch with you. If everything is normal, you will get a letter in mail and a message via . If you don't hear from [place] in two weeks, please give [place] a call. Otherwise, we look forward to seeing you again at your next visit. If you have any questions or concerns before then, please call the clinic at [phone].  Please bring all your medications to every doctors visit  Sign up for My Chart to have easy access to your labs results, and communication with your Primary care physician.    Please check-out at the front desk before leaving the clinic.    Take Care,   Dr. Cyndia Skeeters

## 2017-09-27 ENCOUNTER — Encounter: Payer: Self-pay | Admitting: Student

## 2017-09-27 LAB — TSH: TSH: 3.22 u[IU]/mL (ref 0.450–4.500)

## 2017-10-11 ENCOUNTER — Other Ambulatory Visit: Payer: Self-pay | Admitting: Student

## 2017-10-11 DIAGNOSIS — F3289 Other specified depressive episodes: Secondary | ICD-10-CM

## 2017-10-11 MED ORDER — CITALOPRAM HYDROBROMIDE 20 MG PO TABS: 20.0000 mg | ORAL_TABLET | Freq: Every day | ORAL | 3 refills | Status: DC

## 2017-10-25 ENCOUNTER — Inpatient Hospital Stay: Admission: RE | Admit: 2017-10-25 | Payer: Medicare Other | Source: Ambulatory Visit

## 2017-10-25 ENCOUNTER — Ambulatory Visit: Payer: Medicare Other

## 2017-11-06 ENCOUNTER — Other Ambulatory Visit: Payer: Self-pay | Admitting: Student

## 2018-01-07 ENCOUNTER — Encounter: Payer: Self-pay | Admitting: Internal Medicine

## 2018-01-30 ENCOUNTER — Telehealth: Payer: Self-pay

## 2018-01-30 ENCOUNTER — Other Ambulatory Visit: Payer: Self-pay

## 2018-01-30 DIAGNOSIS — R21 Rash and other nonspecific skin eruption: Secondary | ICD-10-CM

## 2018-01-30 MED ORDER — TRIAMCINOLONE ACETONIDE 0.025 % EX OINT: 1.0000 "application " | TOPICAL_OINTMENT | Freq: Two times a day (BID) | CUTANEOUS | 0 refills | Status: DC

## 2018-03-19 NOTE — Telephone Encounter (Signed)
Error

## 2018-04-04 ENCOUNTER — Other Ambulatory Visit: Payer: Self-pay | Admitting: Student

## 2018-04-04 DIAGNOSIS — R21 Rash and other nonspecific skin eruption: Secondary | ICD-10-CM

## 2018-05-01 ENCOUNTER — Other Ambulatory Visit: Payer: Self-pay | Admitting: Student

## 2018-05-01 ENCOUNTER — Telehealth: Payer: Self-pay

## 2018-05-01 DIAGNOSIS — F411 Generalized anxiety disorder: Secondary | ICD-10-CM

## 2018-05-01 MED ORDER — CLONAZEPAM 0.5 MG PO TABS: 0.5000 mg | ORAL_TABLET | Freq: Two times a day (BID) | ORAL | 5 refills | Status: DC | PRN

## 2018-05-01 NOTE — Telephone Encounter (Signed)
Refilled electronically sent to her pharmacy.

## 2018-05-01 NOTE — Telephone Encounter (Signed)
Patient calling to request refill of:  Name of Medication(s): Clonazepam Last date of OV:  09/26/2017  Pharmacy:  [facility]  Patient down to last 2 pills, please refill asap.    Ottis Stain, CMA

## 2018-05-01 NOTE — Telephone Encounter (Signed)
Pt informed of below. Zimmerman Rumple, Waldon Sheerin D, CMA  

## 2018-05-10 ENCOUNTER — Emergency Department (HOSPITAL_COMMUNITY)
Admission: EM | Admit: 2018-05-10 | Discharge: 2018-05-10 | Disposition: A | Discharge status: Home / Self Care | Payer: Medicare Other | Attending: Emergency Medicine | Admitting: Emergency Medicine

## 2018-05-10 ENCOUNTER — Other Ambulatory Visit: Payer: Self-pay

## 2018-05-10 ENCOUNTER — Encounter (HOSPITAL_COMMUNITY): Payer: Self-pay | Admitting: Emergency Medicine

## 2018-05-10 ENCOUNTER — Other Ambulatory Visit: Payer: Self-pay | Admitting: Student

## 2018-05-10 DIAGNOSIS — Z7982 Long term (current) use of aspirin: Secondary | ICD-10-CM | POA: Diagnosis not present

## 2018-05-10 DIAGNOSIS — E119 Type 2 diabetes mellitus without complications: Secondary | ICD-10-CM | POA: Diagnosis not present

## 2018-05-10 DIAGNOSIS — E039 Hypothyroidism, unspecified: Secondary | ICD-10-CM | POA: Diagnosis not present

## 2018-05-10 DIAGNOSIS — Z79899 Other long term (current) drug therapy: Secondary | ICD-10-CM | POA: Insufficient documentation

## 2018-05-10 DIAGNOSIS — L03011 Cellulitis of right finger: Secondary | ICD-10-CM | POA: Diagnosis not present

## 2018-05-10 DIAGNOSIS — R21 Rash and other nonspecific skin eruption: Secondary | ICD-10-CM

## 2018-05-10 MED ORDER — LIDOCAINE HCL (PF) 1 % IJ SOLN
5.0000 mL | Freq: Once | INTRAMUSCULAR | Status: DC
  Filled 2018-05-10: qty 5

## 2018-05-10 MED ORDER — DOXYCYCLINE HYCLATE 100 MG PO CAPS: 100.0000 mg | ORAL_CAPSULE | Freq: Two times a day (BID) | ORAL | 0 refills | Status: DC

## 2018-05-10 MED ORDER — TRIAMCINOLONE ACETONIDE 0.1 % MT PSTE: 1.0000 "application " | PASTE | Freq: Two times a day (BID) | OROMUCOSAL | 12 refills | Status: DC

## 2018-05-10 MED ORDER — METHYLPREDNISOLONE 4 MG PO TBPK: ORAL_TABLET | ORAL | 0 refills | Status: DC

## 2018-05-10 NOTE — ED Notes (Signed)
Pt ambulated in hallway with a steady gait. No complaint of dizziness or SOB. Pt stated that she "feels good."

## 2018-05-10 NOTE — Discharge Instructions (Signed)
You were seen in the emergency department for rash.  The source is unclear.  However we suspect it may be some type of eczema.  We will treat this with a course of steroids (methylprednisolone), take this pack as prescribed.  Additionally, continue taking hydroxyzine every 6-8 hours to help with itching and apply triamcinolone paste to areas that are itching/dry.  Avoid soaking your body in hot/warm water, sweating.  You were noted to have an infection of your finger nail fold.  This was drained in the ER, take antibiotics to prevent further infection of this.  Return to the ER if you have fevers, chills, rash that worsens or spreads, oral lesions, swelling redness or more pain to your fingertip.  Follow-up with your primary care doctor for reevaluation of rash, since this is the fourth or fifth time that this rash has occurred you will probably need referral to dermatology.

## 2018-05-10 NOTE — ED Provider Notes (Addendum)
[facility] EMERGENCY DEPARTMENT Provider Note   CSN: [id number] Arrival date & time: 05/10/18  1228     History   Chief Complaint Chief Complaint  Patient presents with  . Rash    HPI [name] is a 67 y.o. female with h/o seizures, HTN, here for evaluation of rash. Onset 4 days ago. Onset sudden and gradually worsening. Rash described as red, burning, itching. Located to chin, neck, chest, upper and lower extremities including fingers. Has been using triamcinolone cream and hydroxizine without relief. States this rash has happened 4-5 times in the past. She denies any new exposures to topicals, animals. No known sick contacts. No recent travel. No recent antibiotics or new medications. She has been scrubbing her skin under water to help with the itching. No alleviating or aggravating factors. Reports being under a lot of stress, her son died 15 month ago.  HPI  Past Medical History:  Diagnosis Date  . Anxiety   . Diabetes mellitus without complication ([place])    on metformin  . Hx of adenomatous colonic polyps 01/26/2015  . Memory loss of unknown cause 03/04/2002   suspect depression with psychotic features. patient hospitalized and work-up negative.   . Seizures ([place])    last seizure 4 months ago  . Thyroid disease    thyroid surgery when young    Patient Active Problem List   Diagnosis Date Noted  . Neck pain 09/14/2016  . Annual physical exam 01/25/2016  . Diabetes mellitus ([place]) 04/21/2015  . Hx of adenomatous colonic polyps 01/26/2015  . Bunion 06/30/2014  . Onychomycosis 10/05/2013  . Seizure ([place]) 02/23/2010  . Disturbance in sleep behavior 01/05/2010  . Depression 09/13/2009  . Anxiety state 09/03/2007  . Hypothyroidism 01/23/2007  . Hyperlipidemia 01/23/2007  . Cognitive impairment 01/23/2007  . Migraine headache 01/23/2007  . HYPERTENSION, BENIGN SYSTEMIC 01/23/2007  . DJD, UNSPECIFIED 01/23/2007    Past Surgical History:    Procedure Laterality Date  . BUNIONECTOMY    . THYROIDECTOMY, PARTIAL    . TONSILLECTOMY       OB History   None      Home Medications    Prior to Admission medications   Medication Sig Start Date End Date Taking? Authorizing Provider  aspirin EC 81 MG tablet Take 1 tablet (81 mg total) by mouth daily. 08/22/17   Mercy Riding, MD  atorvastatin (LIPITOR) 40 MG tablet Take 1 tablet (40 mg total) by mouth daily at 6 PM. 09/09/17   Mercy Riding, MD  citalopram (CELEXA) 20 MG tablet Take 1 tablet (20 mg total) daily by mouth. 10/11/17   Mercy Riding, MD  clonazePAM (KLONOPIN) 0.5 MG tablet Take 1 tablet (0.5 mg total) by mouth 2 (two) times daily as needed for anxiety. 05/01/18   Mercy Riding, MD  doxycycline (VIBRAMYCIN) 100 MG capsule Take 1 capsule (100 mg total) by mouth 2 (two) times daily. 05/10/18   Kinnie Feil, PA-C  hydrochlorothiazide (HYDRODIURIL) 25 MG tablet Take 1 tablet (25 mg total) by mouth daily. 08/06/17   Mercy Riding, MD  hydrOXYzine (ATARAX/VISTARIL) 25 MG tablet Take 1 tablet (25 mg total) by mouth 2 (two) times daily as needed for itching. 08/15/16   Haney, Amedeo Plenty, MD  levETIRAcetam (KEPPRA) 500 MG tablet Take 3 tablets (1,500 mg total) by mouth 2 (two) times daily. 07/26/17 07/26/18  Mercy Riding, MD  levothyroxine (SYNTHROID, LEVOTHROID) 75 MCG tablet TAKE ONE TABLET BY MOUTH  DAILY . FOR HYPOTHYROIDISM 09/09/17   Mercy Riding, MD  methylPREDNISolone (MEDROL DOSEPAK) 4 MG TBPK tablet Take methylprednisolone as prescribed and completed for rash 05/10/18   Kinnie Feil, PA-C  QUEtiapine (SEROQUEL) 50 MG tablet Take 0.5 tablets (25 mg total) by mouth at bedtime. 09/26/17   Mercy Riding, MD  SUMAtriptan (IMITREX) 100 MG tablet Take 1 tablet (100 mg total) by mouth as directed. Please schedule office visit! 09/09/17   Mercy Riding, MD  triamcinolone (KENALOG) 0.025 % ointment APPLY 1 APPLICATION TOPICALLY 2 TIMES DAILY 04/04/18   Mercy Riding, MD   triamcinolone (KENALOG) 0.1 % paste Use as directed 1 application in the mouth or throat 2 (two) times daily. 05/10/18   Kinnie Feil, PA-C    Family History Family History  Problem Relation Age of Onset  . Colon cancer Neg Hx   . Rectal cancer Neg Hx   . Stomach cancer Neg Hx     Social History Social History   Tobacco Use  . Smoking status: Never Smoker  . Smokeless tobacco: Never Used  Substance Use Topics  . Alcohol use: No    Alcohol/week: 0.0 oz  . Drug use: No     Allergies   Chocolate; Coffee bean extract [coffea arabica]; and Peanut-containing drug products   Review of Systems Review of Systems  Skin: Positive for rash.  All other systems reviewed and are negative.    Physical Exam Updated Vital Signs BP 133/88 (BP Location: Right Arm)   Pulse (!) 55   Temp 98.7 F (37.1 C) (Oral)   Resp 16   Ht 5\' 3"  (1.6 m)   Wt 68.9 kg (152 lb)   SpO2 100%   BMI 26.93 kg/m   Physical Exam  Constitutional: She is oriented to person, place, and time. She appears well-developed and well-nourished.  Non-toxic appearance.  HENT:  Head: Normocephalic.  Right Ear: External ear normal.  Left Ear: External ear normal.  Nose: Nose normal.  No intraoral or perioral lesions  Eyes: Conjunctivae and EOM are normal.  Neck: Full passive range of motion without pain.  Cardiovascular: Normal rate.  Pulmonary/Chest: Effort normal. No tachypnea. No respiratory distress.  Musculoskeletal: Normal range of motion.  Neurological: She is alert and oriented to person, place, and time.  Skin: Skin is warm and dry. Capillary refill takes less than 2 seconds.  Raised, erythematous, glossy rash to chin, anterior neck and anterior central chest.  Milder to upper proximal extremities and distal lower extremities.  Pustular lesions to fingertips and webs R>L, see picture.    Fluctuance, erythema, tenderness and warm around nail fold of right 4th finger, clear yellow/white drainage  from nail fold oozing.   No rash to palms or soles   Psychiatric: Her behavior is normal. Thought content normal.         ED Treatments / Results  Labs (all labs ordered are listed, but only abnormal results are displayed) Labs Reviewed - No data to display  EKG None  Radiology No results found.  Procedures .Marland KitchenIncision and Drainage Date/Time: 05/10/2018 3:59 PM Performed by: Kinnie Feil, PA-C Authorized by: Kinnie Feil, PA-C   Consent:    Consent obtained:  Verbal   Consent given by:  Patient   Risks discussed:  Bleeding, incomplete drainage, infection and pain   Alternatives discussed:  Alternative treatment Location:    Indications for incision and drainage: paronychia  Pre-procedure details:    Procedure prep: alcohol  wipe. Anesthesia (see MAR for exact dosages):    Anesthesia method:  Topical application   Topical anesthesia: cold spray. Procedure type:    Complexity:  Simple Procedure details:    Incision types:  Single straight   Incision depth:  Dermal   Scalpel size: 18 g needle.   Drainage:  Purulent   Drainage amount:  Moderate   Wound treatment:  Wound left open   Packing materials:  None Post-procedure details:    Patient tolerance of procedure:  Tolerated well, no immediate complications   (including critical care time)  Medications Ordered in ED Medications  lidocaine (PF) (XYLOCAINE) 1 % injection 5 mL (has no administration in time range)     Initial Impression / Assessment and Plan / ED Course  I have reviewed the triage vital signs and the nursing notes.  Pertinent labs & imaging results that were available during my care of the patient were reviewed by me and considered in my medical decision making (see chart for details).  Clinical Course as of May 10 1636  Sat May 10, 2018  1635 EMT notified me pt has HR of 55-60 x 3 times. I reviewed pt's last 4 PCP/office visits and this is her typical range. We ambulated pt  around FT unit and she denied Cp, SOB, palpitations, light-headedness. I do not think pt need emergent evaluation of chronically asymptomatic bradycardia. She has h/o hypothyroidism and has been compliant. Instructed to f/u with PCP for re-evaluation within 1 week.    [CG]    Clinical Course User Index [CG] Kinnie Feil, PA-C   67 year old female here with raised, erythematous, pruritic rash x4 days.  Similar episodes 4-5 times in the past.  She is actively itching on exam.  No facial or oral angioedema.  No intraoral oral lesions.  No rash to palms or soles.  No respiratory compromise.  No known recent exposure to possible allergens.  No new topical hygiene products use.  No new medications or antibiotics.  No evidence of animal bite wounds.  Very low suspicion for dermatological emergency such as EM, SJS, TENS or meningococcemia.  Patient otherwise feels at baseline.  Of note, she was found to have paronychia to the right fourth finger.  This was incised and drained in the ER.  Suspect some type of allergen related rash versus atypical eczema.  Will discharge with systemic steroids and follow-up with dermatology.  Will discharge with antibiotics for paronychia.  Strict ED return precautions given.  Patient and family at bedside are in agreement.  Patient discussed with Dr. Ashok Cordia. Final Clinical Impressions(s) / ED Diagnoses   Final diagnoses:  Rash  Paronychia of finger of right hand    ED Discharge Orders        Ordered    methylPREDNISolone (MEDROL DOSEPAK) 4 MG TBPK tablet     05/10/18 1601    doxycycline (VIBRAMYCIN) 100 MG capsule  2 times daily     05/10/18 1601    triamcinolone (KENALOG) 0.1 % paste  2 times daily     05/10/18 1602        Kinnie Feil, PA-C 05/10/18 1637    Lajean Saver, MD 05/12/18 [phone]

## 2018-05-10 NOTE — ED Triage Notes (Signed)
Patient to ED c/o rash to arms, chest, neck, and chin x 2 days. Hives and redness noted, pt reports burning and itching. Patient has short-term memory loss, husband providing history - he denies patient has used any new lotions/body washes or medications. Patient has had this happen before and used a cream in the past. Does report increased stress.

## 2018-05-14 ENCOUNTER — Telehealth: Payer: Self-pay | Admitting: *Deleted

## 2018-05-14 DIAGNOSIS — R21 Rash and other nonspecific skin eruption: Secondary | ICD-10-CM

## 2018-05-14 NOTE — Telephone Encounter (Signed)
I am covering Dr. Juliann Pares box this week. It appears that he actually sent in triamcinolone cream to the pharmacy on 6/17. I will place a referral to dermatology. Please inform patient.

## 2018-05-14 NOTE — Telephone Encounter (Signed)
Pts sister calls because pt is still having the itchy rash (went to ED on 6/15) and wants to know if the provider will call in more triamcinolone ("was given a very small tube the size of your finger") and refer her to a dermatologist.   She is not having any SOB, just itching. [name], [name], CMA

## 2018-05-14 NOTE — Telephone Encounter (Signed)
Pts husband, willie, informed. Doralyn Kirkes, [name], CMA

## 2018-05-27 ENCOUNTER — Telehealth: Payer: Self-pay

## 2018-05-27 NOTE — Telephone Encounter (Signed)
Patient on ointment and oral med for itching from eczema. Itching and rash still very severe; no relief.  Derm appt not until November. What else can be done to help?  Call back is [phone]  Danley Danker, RN [facility])

## 2018-05-28 NOTE — Telephone Encounter (Signed)
Called and spoke to husband. Pt has taken a sleeping pill and wont be able to come in this afternoon. I scheduled her an appt for Friday 7/5 at 10:10. Ottis Stain, CMA

## 2018-05-28 NOTE — Telephone Encounter (Signed)
Please help her schedule an appt in the next day or so (maybe ATC pool this afternoon) to be seen for this, and we will certainly try to help!

## 2018-05-30 ENCOUNTER — Ambulatory Visit (INDEPENDENT_AMBULATORY_CARE_PROVIDER_SITE_OTHER): Payer: Medicare Other | Admitting: Family Medicine

## 2018-05-30 ENCOUNTER — Other Ambulatory Visit: Payer: Self-pay

## 2018-05-30 VITALS — BP 122/76 | HR 59 | Temp 98.5°F | Ht 64.0 in | Wt 159.2 lb

## 2018-05-30 DIAGNOSIS — L301 Dyshidrosis [pompholyx]: Secondary | ICD-10-CM | POA: Diagnosis present

## 2018-05-30 HISTORY — DX: Dyshidrosis (pompholyx): L30.1

## 2018-05-30 MED ORDER — PREDNISONE 5 MG PO TABS: 5.0000 mg | ORAL_TABLET | Freq: Every day | ORAL | 0 refills | Status: AC

## 2018-05-30 MED ORDER — TRIAMCINOLONE ACETONIDE 0.5 % EX OINT: 1.0000 "application " | TOPICAL_OINTMENT | Freq: Two times a day (BID) | CUTANEOUS | 0 refills | Status: DC

## 2018-05-30 MED ORDER — PREDNISONE 20 MG PO TABS: 20.0000 mg | ORAL_TABLET | Freq: Every day | ORAL | 0 refills | Status: AC

## 2018-05-30 MED ORDER — PREDNISONE 10 MG PO TABS: 10.0000 mg | ORAL_TABLET | Freq: Every day | ORAL | 0 refills | Status: AC

## 2018-05-30 MED ORDER — HYDROXYZINE HCL 25 MG PO TABS: 25.0000 mg | ORAL_TABLET | Freq: Two times a day (BID) | ORAL | 1 refills | Status: DC | PRN

## 2018-05-30 NOTE — Patient Instructions (Addendum)
It was a pleasure to see you today! Thank you for choosing [facility] for your primary care. [name] was seen for rash. We are going to start you on two new medications. Continue to take your hydroxyzine as needed for itching. Also wear gloves on hands, and bags over feet at night when applying the Kenalog cream.   Meds ordered this encounter  Medications  . triamcinolone ointment (KENALOG) 0.5 %    Sig: Apply 1 application topically 2 (two) times daily.    Dispense:  30 g    Refill:  0  . predniSONE (DELTASONE) 20 MG tablet    Sig: Take 1 tablet (20 mg total) by mouth daily with breakfast for 5 days.    Dispense:  5 tablet    Refill:  0  . predniSONE (DELTASONE) 10 MG tablet    Sig: Take 1 tablet (10 mg total) by mouth daily with breakfast for 5 days.    Dispense:  5 tablet    Refill:  0  . predniSONE (DELTASONE) 5 MG tablet    Sig: Take 1 tablet (5 mg total) by mouth daily with breakfast for 5 days.    Dispense:  5 tablet    Refill:  0      Best,  Marny Lowenstein, MD, MS FAMILY MEDICINE RESIDENT - PGY1 05/30/2018 10:25 AM

## 2018-05-30 NOTE — Assessment & Plan Note (Signed)
Vesicular rash affecting palms, webs of fingers, entire hand, chest, feet.  Given its systemic distribution, and lack of history of infective type symptoms, unlikely to be infected such as syphilis, coxsackie's.  Given improvement with steroids, will treat with additional burst of oral steroids with taper, increased potency of Kenalog, have patient return in 2 weeks for follow-up.

## 2018-05-30 NOTE — Progress Notes (Signed)
Subjective:  [name] is a 67 y.o. female who presents to the [facility] today with a chief complaint of rash on her hands, feet and chest.   HPI:  Rash Patient complains of 3 or more weeks of rash on her hands, feet, chest.  The rash has been extremely painful, "as a 14 out of 10".  Is also been extremely pruritic.  Patient has history of eczema, but denies that what she is currently experiencing is like that.  Describes having blisters in the webs of her fingers that burst.  This was about 2 weeks ago.  Predominantly itchy at night.  Was seen in ED on 6/15, was given 4-week course of oral steroids, 1 week of doxycycline, triamcinolone cream 0.1%.  Patient has had some relief with this but is still having ongoing symptoms.  Patient denies any history of oropharyngeal involvement, fevers, chills, URI your systemic infective type symptoms.  Patient denies any chest pain shortness of breath, joint pain.  Patient denies any new medications.  Denies eating any new foods or eat countering anything that she knows as an allergen.  Patient endorses being quite distressed due to the death of her son last week.  ROS: Per HPI   Objective:  Physical Exam: BP 122/76   Pulse (!) 59   Temp 98.5 F (36.9 C) (Oral)   Ht 5\' 4"  (1.626 m)   Wt 159 lb 3.2 oz (72.2 kg)   SpO2 98%   BMI 27.33 kg/m   Gen: NAD, resting comfortably CV: RRR with no murmurs appreciated Pulm: NWOB, CTAB with no crackles, wheezes, or rhonchi GI: Soft, Nontender, Nondistended. MSK: no edema, cyanosis, or clubbing noted Skin: Patient has multiple vesicles that are ruptured along the webs of fingers bilaterally extending down to the tips of the fingers and involving the nailbeds, she has scaling along the dorsal part of her fingers, vesicles affect palmar surface of left hand, erythematous papules across chest, vesicular appearance of rash affecting feet and similar manner. Neuro: grossly normal, moves all extremities Psych:  Normal affect and thought content  No results found for this or any previous visit (from the past 72 hour(s)).   Assessment/Plan:  Pompholyx eczema Vesicular rash affecting palms, webs of fingers, entire hand, chest, feet.  Given its systemic distribution, and lack of history of infective type symptoms, unlikely to be infected such as syphilis, coxsackie's.  Given improvement with steroids, will treat with additional burst of oral steroids with taper, increased potency of Kenalog, have patient return in 2 weeks for follow-up.    Lab Orders  No laboratory test(s) ordered today    Meds ordered this encounter  Medications  . triamcinolone ointment (KENALOG) 0.5 %    Sig: Apply 1 application topically 2 (two) times daily.    Dispense:  30 g    Refill:  0  . predniSONE (DELTASONE) 20 MG tablet    Sig: Take 1 tablet (20 mg total) by mouth daily with breakfast for 5 days.    Dispense:  5 tablet    Refill:  0  . predniSONE (DELTASONE) 10 MG tablet    Sig: Take 1 tablet (10 mg total) by mouth daily with breakfast for 5 days.    Dispense:  5 tablet    Refill:  0  . predniSONE (DELTASONE) 5 MG tablet    Sig: Take 1 tablet (5 mg total) by mouth daily with breakfast for 5 days.    Dispense:  5 tablet  Refill:  0  . hydrOXYzine (ATARAX/VISTARIL) 25 MG tablet    Sig: Take 1 tablet (25 mg total) by mouth 2 (two) times daily as needed for itching.    Dispense:  180 tablet    Refill:  [place], MD, [place] - PGY1 05/30/2018 10:53 AM

## 2018-06-03 ENCOUNTER — Other Ambulatory Visit: Payer: Self-pay | Admitting: Family Medicine

## 2018-06-03 DIAGNOSIS — L301 Dyshidrosis [pompholyx]: Secondary | ICD-10-CM

## 2018-06-06 NOTE — Telephone Encounter (Signed)
Please call patient, I cannot refill prednisone. She needs to be seen if rash continues, this is not a long term medicine. She had a plan with Dr. Grandville Silos to return in 2 weeks from 7/5. Please help her make an appt.

## 2018-06-10 NOTE — Telephone Encounter (Signed)
Tried to contact pt to inform her of below and the phone said memory is full please use remote access code.  Unable to LM.  If pt calls back please inform her of below and let her know that she has an appointment with Dr. Lindell Noe on 06/18/18, she can talk to her about it then if she wants to.  If she feels like she needs to be seen before then please assist her in scheduling an appointment.  Katharina Caper, Korinna Tat D, [place]

## 2018-06-18 ENCOUNTER — Encounter: Payer: Self-pay | Admitting: Family Medicine

## 2018-06-18 ENCOUNTER — Ambulatory Visit (INDEPENDENT_AMBULATORY_CARE_PROVIDER_SITE_OTHER): Payer: Medicare Other | Admitting: Family Medicine

## 2018-06-18 ENCOUNTER — Other Ambulatory Visit: Payer: Self-pay

## 2018-06-18 VITALS — BP 132/72 | HR 60 | Temp 98.4°F | Ht 64.0 in | Wt 163.6 lb

## 2018-06-18 DIAGNOSIS — E119 Type 2 diabetes mellitus without complications: Secondary | ICD-10-CM

## 2018-06-18 DIAGNOSIS — E039 Hypothyroidism, unspecified: Secondary | ICD-10-CM

## 2018-06-18 DIAGNOSIS — F411 Generalized anxiety disorder: Secondary | ICD-10-CM

## 2018-06-18 DIAGNOSIS — E785 Hyperlipidemia, unspecified: Secondary | ICD-10-CM

## 2018-06-18 LAB — POCT GLYCOSYLATED HEMOGLOBIN (HGB A1C): HbA1c, POC (controlled diabetic range): 7 % (ref 0.0–7.0)

## 2018-06-18 MED ORDER — DTAP-IPV VACCINE IM SUSP: 0.5000 mL | Freq: Once | INTRAMUSCULAR | 0 refills | Status: AC

## 2018-06-18 NOTE — Patient Instructions (Signed)
It was a pleasure to see you today! Thank you for choosing [facility] for your primary care. [name] was seen for diabetes, medication review.   Our plans for today were:  You don't need a diabetes medicine at this point, but please try to decrease the amount of candy that you are eating.   For your swelling, please try eat less pork skins and salty things.   You will need to go to the pharmacy to get your tetanus shot.   Please come back in 1 month with your husband to discuss your medications.   Please schedule your colonoscopy.   Best,  Dr. Lindell Noe

## 2018-06-18 NOTE — Progress Notes (Signed)
   CC: memory issues, DM, hand swelling  HPI  Has noticed occasional hand swelling with being in the heat. Also notes eating a lot of pork skins.   DM - no meds, was not clear on this diagnosis. Has been eating a lot of hard candy.   Memory trouble - patient states that she woke up one morning and 2003 and could not remember short-term events.  Previous notes suggest this is likely some sort of encephalitis and she was admitted for this.  She now has to write down notes to herself all day including taping medicines into her planner.  She has family to help her with this.  Med rec- has pill box, husband fills that. He would know if she needs refills.   Seizures - been 1-2 months since last seizure. Neurologist at the [facility] vs [place]? She isn't sure.   Using klonipin twice per day for panic attacks.  Granddaughter relates this is related to certain events.  ROS: Denies CP, SOB, abdominal pain, dysuria, changes in BMs.   CC, SH/smoking status, and VS noted  Objective: BP 132/72   Pulse 60   Temp 98.4 F (36.9 C) (Oral)   Ht 5\' 4"  (1.626 m)   Wt 163 lb 9.6 oz (74.2 kg)   SpO2 94%   BMI 28.08 kg/m  Gen: NAD, alert, cooperative, and pleasant. HEENT: NCAT, EOMI, PERRL CV: RRR, no murmur Resp: CTAB, no wheezes, non-labored Abd: SNTND, BS present, no guarding or organomegaly Ext: No edema in hands or feet, no joint tenderness, warm.  Neuro: Alert and oriented, Speech clear, No gross deficits  Assessment and plan:  Anxiety state Introduced to patient the idea of the Klonopin could be further exacerbating her mental fogginess, question whether we could taper this at next visit. Will be cautious as this could exacerbate seizure frequency.   Diabetes mellitus a1c at goal of 7 today. Will not add therapy as patient has trouble managing her medicines as it is. Will recheck 3 months.   Hypothyroidism Due for recheck today. No unintentional weight loss or concerns. Recheck TSH.    Hyperlipidemia Taking statin, recheck lipids today.  Hand swelling:  None present today, suspect transient swelling secondary to heat. Also asked patient to decrease her intake of salty foods such as pork skins.   Coordination of care: Asked patient to return in 86 month with her husband, who did not come today because he was not feeling well, to coordinate her care given her memory concerns and to discuss refills.  Orders Placed This Encounter  Procedures  . CBC  . Basic metabolic panel  . TSH  . Lipid panel  . POCT glycosylated hemoglobin (Hb A1C)    Meds ordered this encounter  Medications  . DTaP-IPV Lynann Bologna) injection    Sig: Inject 0.5 mLs into the muscle once for 1 dose.    Dispense:  0.5 mL    Refill:  0    Ralene Ok, MD, PGY3 06/19/2018 12:10 PM

## 2018-06-19 LAB — CBC
Hematocrit: 35.5 % (ref 34.0–46.6)
Hemoglobin: 11.6 g/dL (ref 11.1–15.9)
MCH: 31.7 pg (ref 26.6–33.0)
MCHC: 32.7 g/dL (ref 31.5–35.7)
MCV: 97 fL (ref 79–97)
Platelets: 258 10*3/uL (ref 150–450)
RBC: 3.66 x10E6/uL — ABNORMAL LOW (ref 3.77–5.28)
RDW: 13 % (ref 12.3–15.4)
WBC: 3.9 10*3/uL (ref 3.4–10.8)

## 2018-06-19 LAB — LIPID PANEL
Chol/HDL Ratio: 2.2 ratio (ref 0.0–4.4)
Cholesterol, Total: 226 mg/dL — ABNORMAL HIGH (ref 100–199)
HDL: 105 mg/dL (ref >39)
LDL Calculated: 107 mg/dL — ABNORMAL HIGH (ref 0–99)
Triglycerides: 72 mg/dL (ref 0–149)
VLDL Cholesterol Cal: 14 mg/dL (ref 5–40)

## 2018-06-19 LAB — BASIC METABOLIC PANEL
BUN/Creatinine Ratio: 15 (ref 12–28)
BUN: 12 mg/dL (ref 8–27)
CO2: 28 mmol/L (ref 20–29)
Calcium: 9 mg/dL (ref 8.7–10.3)
Chloride: 100 mmol/L (ref 96–106)
Creatinine, Ser: 0.8 mg/dL (ref 0.57–1.00)
GFR calc Af Amer: 88 mL/min/{1.73_m2} (ref >59)
GFR calc non Af Amer: 77 mL/min/{1.73_m2} (ref >59)
Glucose: 152 mg/dL — ABNORMAL HIGH (ref 65–99)
Potassium: 3.7 mmol/L (ref 3.5–5.2)
Sodium: 142 mmol/L (ref 134–144)

## 2018-06-19 LAB — TSH: TSH: 4.13 u[IU]/mL (ref 0.450–4.500)

## 2018-06-19 NOTE — Assessment & Plan Note (Signed)
Taking statin, recheck lipids today.

## 2018-06-19 NOTE — Assessment & Plan Note (Addendum)
Introduced to patient the idea of the Klonopin could be further exacerbating her mental fogginess, question whether we could taper this at next visit. Will be cautious as this could exacerbate seizure frequency.

## 2018-06-19 NOTE — Assessment & Plan Note (Signed)
a1c at goal of 7 today. Will not add therapy as patient has trouble managing her medicines as it is. Will recheck 3 months.

## 2018-06-19 NOTE — Assessment & Plan Note (Signed)
Due for recheck today. No unintentional weight loss or concerns. Recheck TSH.

## 2018-06-20 ENCOUNTER — Telehealth: Payer: Self-pay | Admitting: *Deleted

## 2018-06-20 NOTE — Telephone Encounter (Signed)
Pt informed. Zimmerman Rumple, Dijon Cosens D, CMA  

## 2018-06-20 NOTE — Telephone Encounter (Signed)
-----   Message from Sela Hilding, MD sent at 06/19/2018 12:17 PM EDT ----- [facility]a Severin team, please let patient know her labs are normal.

## 2018-08-01 ENCOUNTER — Ambulatory Visit: Payer: Medicare Other | Admitting: Family Medicine

## 2018-08-12 ENCOUNTER — Other Ambulatory Visit: Payer: Self-pay

## 2018-08-12 DIAGNOSIS — I10 Essential (primary) hypertension: Secondary | ICD-10-CM

## 2018-08-13 MED ORDER — HYDROCHLOROTHIAZIDE 25 MG PO TABS: 25.0000 mg | ORAL_TABLET | Freq: Every day | ORAL | 3 refills | Status: DC

## 2018-08-13 NOTE — Telephone Encounter (Signed)
Will refill HCTZ, but patient overdue for appt with me - they missed it earlier this month. We need an appt that she and her husband can attend because she cannot manage her own medications and her husband does this. We need to reconcile her meds. If she can't see me soon, you could also do med review with Dr. Valentina Lucks. Please call and have patient seen as soon as they can for med review follow up.

## 2018-08-20 ENCOUNTER — Other Ambulatory Visit: Payer: Self-pay | Admitting: *Deleted

## 2018-08-20 DIAGNOSIS — L301 Dyshidrosis [pompholyx]: Secondary | ICD-10-CM

## 2018-08-20 MED ORDER — TRIAMCINOLONE ACETONIDE 0.5 % EX OINT: 1.0000 "application " | TOPICAL_OINTMENT | Freq: Two times a day (BID) | CUTANEOUS | 0 refills | Status: DC

## 2018-09-10 ENCOUNTER — Other Ambulatory Visit: Payer: Self-pay | Admitting: Family Medicine

## 2018-09-10 DIAGNOSIS — L301 Dyshidrosis [pompholyx]: Secondary | ICD-10-CM

## 2018-09-25 ENCOUNTER — Other Ambulatory Visit: Payer: Self-pay

## 2018-09-25 DIAGNOSIS — L301 Dyshidrosis [pompholyx]: Secondary | ICD-10-CM

## 2018-09-26 MED ORDER — TRIAMCINOLONE ACETONIDE 0.5 % EX OINT: TOPICAL_OINTMENT | CUTANEOUS | 0 refills | Status: DC

## 2018-09-30 ENCOUNTER — Other Ambulatory Visit: Payer: Self-pay

## 2018-09-30 DIAGNOSIS — L301 Dyshidrosis [pompholyx]: Secondary | ICD-10-CM

## 2018-10-01 MED ORDER — TRIAMCINOLONE ACETONIDE 0.5 % EX OINT: TOPICAL_OINTMENT | CUTANEOUS | 0 refills | Status: DC

## 2018-10-03 ENCOUNTER — Other Ambulatory Visit: Payer: Self-pay

## 2018-10-03 DIAGNOSIS — E785 Hyperlipidemia, unspecified: Secondary | ICD-10-CM

## 2018-10-03 DIAGNOSIS — F3289 Other specified depressive episodes: Secondary | ICD-10-CM

## 2018-10-03 MED ORDER — ATORVASTATIN CALCIUM 40 MG PO TABS: 40.0000 mg | ORAL_TABLET | Freq: Every day | ORAL | 3 refills | Status: DC

## 2018-10-03 MED ORDER — CITALOPRAM HYDROBROMIDE 20 MG PO TABS: 20.0000 mg | ORAL_TABLET | Freq: Every day | ORAL | 3 refills | Status: DC

## 2018-11-12 ENCOUNTER — Other Ambulatory Visit: Payer: Self-pay | Admitting: *Deleted

## 2018-11-12 DIAGNOSIS — F411 Generalized anxiety disorder: Secondary | ICD-10-CM

## 2018-11-12 MED ORDER — CLONAZEPAM 0.5 MG PO TABS: 0.5000 mg | ORAL_TABLET | Freq: Two times a day (BID) | ORAL | 0 refills | Status: DC | PRN

## 2018-11-12 NOTE — Telephone Encounter (Signed)
Please call patient and have her see me in the next month. She needs an appt time that her husband can attend - he manages her meds as she has dementia. She needs to be seen before additional refills. Sent 1 month.

## 2018-11-13 NOTE — Telephone Encounter (Signed)
Contacted pt and spoke with her husband and scheduled pt for 12/18/2018 to see PCP. Husband stated he would be able to attend this visit. Katharina Caper, Keontay Vora D, [place]

## 2018-12-03 ENCOUNTER — Other Ambulatory Visit: Payer: Self-pay

## 2018-12-03 DIAGNOSIS — G479 Sleep disorder, unspecified: Secondary | ICD-10-CM

## 2018-12-03 MED ORDER — QUETIAPINE FUMARATE 50 MG PO TABS: 25.0000 mg | ORAL_TABLET | Freq: Every day | ORAL | 0 refills | Status: DC

## 2018-12-03 NOTE — Telephone Encounter (Signed)
Will send one refill of seroquel until appt on 1/23.

## 2018-12-12 ENCOUNTER — Other Ambulatory Visit: Payer: Self-pay

## 2018-12-12 DIAGNOSIS — E039 Hypothyroidism, unspecified: Secondary | ICD-10-CM

## 2018-12-15 MED ORDER — LEVOTHYROXINE SODIUM 75 MCG PO TABS: ORAL_TABLET | ORAL | 0 refills | Status: DC

## 2018-12-18 ENCOUNTER — Encounter: Payer: Self-pay | Admitting: Family Medicine

## 2018-12-18 ENCOUNTER — Ambulatory Visit (INDEPENDENT_AMBULATORY_CARE_PROVIDER_SITE_OTHER): Payer: Medicare Other | Admitting: Family Medicine

## 2018-12-18 VITALS — BP 138/72 | HR 64 | Temp 97.8°F | Ht 64.0 in | Wt 181.6 lb

## 2018-12-18 DIAGNOSIS — R4189 Other symptoms and signs involving cognitive functions and awareness: Secondary | ICD-10-CM

## 2018-12-18 DIAGNOSIS — I10 Essential (primary) hypertension: Secondary | ICD-10-CM

## 2018-12-18 DIAGNOSIS — E119 Type 2 diabetes mellitus without complications: Secondary | ICD-10-CM

## 2018-12-18 DIAGNOSIS — E785 Hyperlipidemia, unspecified: Secondary | ICD-10-CM

## 2018-12-18 DIAGNOSIS — E039 Hypothyroidism, unspecified: Secondary | ICD-10-CM

## 2018-12-18 DIAGNOSIS — F411 Generalized anxiety disorder: Secondary | ICD-10-CM

## 2018-12-18 DIAGNOSIS — F3289 Other specified depressive episodes: Secondary | ICD-10-CM | POA: Diagnosis not present

## 2018-12-18 DIAGNOSIS — Z23 Encounter for immunization: Secondary | ICD-10-CM

## 2018-12-18 LAB — POCT GLYCOSYLATED HEMOGLOBIN (HGB A1C): HbA1c, POC (controlled diabetic range): 7.6 % — AB (ref 0.0–7.0)

## 2018-12-18 MED ORDER — LEVOTHYROXINE SODIUM 75 MCG PO TABS: ORAL_TABLET | ORAL | 0 refills | Status: DC

## 2018-12-18 MED ORDER — ASPIRIN EC 81 MG PO TBEC: 81.0000 mg | DELAYED_RELEASE_TABLET | Freq: Every day | ORAL | 0 refills | Status: DC

## 2018-12-18 MED ORDER — CITALOPRAM HYDROBROMIDE 20 MG PO TABS: 20.0000 mg | ORAL_TABLET | Freq: Every day | ORAL | 1 refills | Status: DC

## 2018-12-18 MED ORDER — METFORMIN HCL ER 500 MG PO TB24: 500.0000 mg | ORAL_TABLET | Freq: Every day | ORAL | 0 refills | Status: DC

## 2018-12-18 MED ORDER — CLONAZEPAM 0.5 MG PO TABS: 0.5000 mg | ORAL_TABLET | Freq: Two times a day (BID) | ORAL | 0 refills | Status: DC | PRN

## 2018-12-18 MED ORDER — ATORVASTATIN CALCIUM 40 MG PO TABS: 40.0000 mg | ORAL_TABLET | Freq: Every day | ORAL | 2 refills | Status: DC

## 2018-12-18 NOTE — Progress Notes (Signed)
   CC: med review  HPI  DM - haven't been checking CBGs at home because someone said they didn't need to. Previously had GI upset on metformin regular, never been on XR.   Husband has been cutting klonipin in hafl and takin QID. She has hot flashes and flashbacks to "bad stuff." panic attacks seemed to bring on seizures. Last seizure was >3 years ago. Follows with [place] neurology. Mood seems to be stable per husband report. Patient pleasant and agreeable today.   Med review - husband dispenses through the pill box. We went over all of her medications today, he brought bottles.   Patient is pleasant, smiling. Cannot recall that we have met before, writes down our conversation and has to reference this frequently.   ROS: Denies CP, SOB, abdominal pain, dysuria, changes in BMs.   CC, SH/smoking status, and VS noted  Objective: BP 138/72   Pulse 64   Temp 97.8 F (36.6 C) (Oral)   Ht '5\' 4"'$  (1.626 m)   Wt 181 lb 9.6 oz (82.4 kg)   SpO2 98%   BMI 31.17 kg/m  Gen: NAD, alert, cooperative, and pleasant. HEENT: NCAT, EOMI, PERRL CV: RRR, no murmur Resp: CTAB, no wheezes, non-labored Ext: No edema, warm Neuro: Alert and oriented, Speech clear, No gross deficits  Assessment and plan:  HYPERTENSION, BENIGN SYSTEMIC Continue HCTZ.   Diabetes mellitus Add '500mg'$  metformin XR, can increase to '1000mg'$  QD if tolerating. Recheck BMP at next visit in 3 mo.   Hypothyroidism Stable at last check, recheck next visit. No concerns.   Anxiety state While benzos are not ideal in this age group, patient is appropriate as is family. Low dose klonipin seems to help her function within her limitations given underlying brain injury. Would consider decreasing dose at next visit if stable.   Cognitive impairment Depression likely related to this as well. Stable today, continue celexa and seroquel.    Orders Placed This Encounter  Procedures  . Flu Vaccine QUAD 36+ mos IM  . Pneumococcal  polysaccharide vaccine 23-valent greater than or equal to 2yo subcutaneous/IM  . HgB A1c    Meds ordered this encounter  Medications  . aspirin EC 81 MG tablet    Sig: Take 1 tablet (81 mg total) by mouth daily.    Dispense:  90 tablet    Refill:  0  . atorvastatin (LIPITOR) 40 MG tablet    Sig: Take 1 tablet (40 mg total) by mouth daily at 6 PM.    Dispense:  90 tablet    Refill:  2  . citalopram (CELEXA) 20 MG tablet    Sig: Take 1 tablet (20 mg total) by mouth daily.    Dispense:  90 tablet    Refill:  1  . clonazePAM (KLONOPIN) 0.5 MG tablet    Sig: Take 1 tablet (0.5 mg total) by mouth 2 (two) times daily as needed for anxiety.    Dispense:  60 tablet    Refill:  0  . levothyroxine (SYNTHROID, LEVOTHROID) 75 MCG tablet    Sig: TAKE ONE TABLET BY MOUTH DAILY . FOR HYPOTHYROIDISM    Dispense:  90 tablet    Refill:  0  . metFORMIN (GLUCOPHAGE-XR) 500 MG 24 hr tablet    Sig: Take 1 tablet (500 mg total) by mouth daily with breakfast.    Dispense:  90 tablet    Refill:  0    Ralene Ok, MD, PGY3 12/22/2018 10:01 AM

## 2018-12-18 NOTE — Assessment & Plan Note (Signed)
Depression likely related to this as well. Stable today, continue celexa and seroquel.

## 2018-12-18 NOTE — Assessment & Plan Note (Signed)
Add 500mg  metformin XR, can increase to 1000mg  QD if tolerating. Recheck BMP at next visit in 3 mo.

## 2018-12-18 NOTE — Patient Instructions (Signed)
It was a pleasure to see you today! Thank you for choosing [facility] for your primary care. [name] was seen for physical.   Our plans for today were:  We will start a low dose of metformin for your blood sugars, increase to 1000mg  once daily if doing ok.   You should call Dr. Celesta Aver office ( GI) to schedule your colonoscopy.   We will do your pneumonia and flu shots.   You can get your tetanus shot at the pharmacy.   Best,  Dr. Lindell Noe

## 2018-12-18 NOTE — Assessment & Plan Note (Signed)
Continue HCTZ

## 2018-12-18 NOTE — Assessment & Plan Note (Signed)
While benzos are not ideal in this age group, patient is appropriate as is family. Low dose klonipin seems to help her function within her limitations given underlying brain injury. Would consider decreasing dose at next visit if stable.

## 2018-12-18 NOTE — Assessment & Plan Note (Signed)
Stable at last check, recheck next visit. No concerns.

## 2018-12-19 DIAGNOSIS — Z23 Encounter for immunization: Secondary | ICD-10-CM | POA: Diagnosis not present

## 2019-01-08 ENCOUNTER — Telehealth: Payer: Self-pay

## 2019-01-08 NOTE — Telephone Encounter (Signed)
Patient husband called. Stated that the Metformin that was prescribed for patient is causing her to have constipation and stomach cramps.  Wants to know if medication can be changed.   Call back is [phone].  Danley Danker, RN [facility] RN)

## 2019-01-08 NOTE — Telephone Encounter (Signed)
Called husband back, counseled him that metformin is less likely to cause constipation and typically causes diarrhea.  He thinks that she has maybe taken 1 dose of MiraLAX.  She has been taking the metformin with meals.  I asked him to try a two-step approach in which we use double dose MiraLAX for the next couple of days to try to have soft bowel movements which will likely relieve her stomach cramps.  Additionally, if he feels like this is making no difference and he wants to stop the metformin that is fine.  We did discuss oral other oral medications at her next visit.  I did counsel him that metformin side effects tend to wane over the first month and if she can get over this hump she may be able to safely take it without any side effects for quite some time.

## 2019-01-15 ENCOUNTER — Other Ambulatory Visit: Payer: Self-pay | Admitting: Family Medicine

## 2019-01-15 DIAGNOSIS — F411 Generalized anxiety disorder: Secondary | ICD-10-CM

## 2019-02-09 ENCOUNTER — Other Ambulatory Visit: Payer: Self-pay | Admitting: Family Medicine

## 2019-02-09 DIAGNOSIS — F411 Generalized anxiety disorder: Secondary | ICD-10-CM

## 2019-02-10 ENCOUNTER — Telehealth: Payer: Self-pay

## 2019-02-10 DIAGNOSIS — L301 Dyshidrosis [pompholyx]: Secondary | ICD-10-CM

## 2019-02-10 MED ORDER — SUMATRIPTAN SUCCINATE 25 MG PO TABS: 25.0000 mg | ORAL_TABLET | ORAL | 0 refills | Status: DC | PRN

## 2019-02-10 MED ORDER — TRIAMCINOLONE ACETONIDE 0.5 % EX OINT: TOPICAL_OINTMENT | CUTANEOUS | 1 refills | Status: DC

## 2019-02-10 NOTE — Telephone Encounter (Signed)
Patient husband called in for refill of Imitrex. Not on current med list.  Danley Danker, RN [facility] RN)

## 2019-03-11 ENCOUNTER — Other Ambulatory Visit: Payer: Self-pay | Admitting: Family Medicine

## 2019-03-11 DIAGNOSIS — F411 Generalized anxiety disorder: Secondary | ICD-10-CM

## 2019-04-07 ENCOUNTER — Other Ambulatory Visit: Payer: Self-pay | Admitting: Family Medicine

## 2019-04-07 DIAGNOSIS — F411 Generalized anxiety disorder: Secondary | ICD-10-CM

## 2019-05-16 ENCOUNTER — Other Ambulatory Visit: Payer: Self-pay | Admitting: Family Medicine

## 2019-05-16 DIAGNOSIS — F411 Generalized anxiety disorder: Secondary | ICD-10-CM

## 2019-06-16 ENCOUNTER — Telehealth: Payer: Self-pay | Admitting: *Deleted

## 2019-06-16 ENCOUNTER — Other Ambulatory Visit: Payer: Self-pay | Admitting: Family Medicine

## 2019-06-16 ENCOUNTER — Other Ambulatory Visit: Payer: Self-pay | Admitting: *Deleted

## 2019-06-16 DIAGNOSIS — E119 Type 2 diabetes mellitus without complications: Secondary | ICD-10-CM

## 2019-06-16 DIAGNOSIS — G479 Sleep disorder, unspecified: Secondary | ICD-10-CM

## 2019-06-16 DIAGNOSIS — E039 Hypothyroidism, unspecified: Secondary | ICD-10-CM

## 2019-06-16 DIAGNOSIS — F411 Generalized anxiety disorder: Secondary | ICD-10-CM

## 2019-06-16 MED ORDER — QUETIAPINE FUMARATE 50 MG PO TABS: 25.0000 mg | ORAL_TABLET | Freq: Every day | ORAL | 0 refills | Status: DC

## 2019-06-16 MED ORDER — LEVOTHYROXINE SODIUM 75 MCG PO TABS: ORAL_TABLET | ORAL | 0 refills | Status: DC

## 2019-06-16 MED ORDER — CLONAZEPAM 0.5 MG PO TABS: 0.5000 mg | ORAL_TABLET | Freq: Two times a day (BID) | ORAL | 0 refills | Status: DC | PRN

## 2019-06-16 MED ORDER — METFORMIN HCL 500 MG PO TABS: 500.0000 mg | ORAL_TABLET | Freq: Two times a day (BID) | ORAL | 3 refills | Status: DC

## 2019-06-16 NOTE — Telephone Encounter (Signed)
Husband informed. Christen Bame, CMA

## 2019-06-16 NOTE — Telephone Encounter (Signed)
Recall on metformin ER.  Rx sent for metformin 500mg  BID.  Please inform patient that this will be okay for her to take.  Also ask her to make appointment in the next three months to ensure that her diabetes is doing well.

## 2019-06-16 NOTE — Telephone Encounter (Signed)
PMP reviewed.  Patient has been filling klonopin appropriately.  No concerns on review.  Would like for patient to have visit in next month.

## 2019-06-16 NOTE — Telephone Encounter (Signed)
Husband states that they received a letter from pharmacy telling them that her metformin is recalled and the patient needs to reach out to [place] to get something else called in. [name], [place]

## 2019-06-16 NOTE — Progress Notes (Signed)
Recall on metformin ER.  Will prescribe 500mg  BID and cancel ER.

## 2019-06-29 ENCOUNTER — Other Ambulatory Visit: Payer: Self-pay | Admitting: Family Medicine

## 2019-07-11 ENCOUNTER — Other Ambulatory Visit: Payer: Self-pay | Admitting: Family Medicine

## 2019-07-17 ENCOUNTER — Other Ambulatory Visit: Payer: Self-pay | Admitting: Family Medicine

## 2019-07-17 DIAGNOSIS — L301 Dyshidrosis [pompholyx]: Secondary | ICD-10-CM

## 2019-07-17 DIAGNOSIS — F411 Generalized anxiety disorder: Secondary | ICD-10-CM

## 2019-07-17 NOTE — Telephone Encounter (Signed)
Pts husband calls back.  Pt was taking the hydroxyzine for itching, not to calm her nerves.  Will forward back to PCP to advise. Christen Bame, CMA

## 2019-07-22 MED ORDER — TRIAMCINOLONE ACETONIDE 0.5 % EX OINT: TOPICAL_OINTMENT | CUTANEOUS | 1 refills | Status: DC

## 2019-07-22 NOTE — Telephone Encounter (Signed)
Husband calls stating that pt will be out of her clonazepam tomorrow.  Will forward to Dr. Owens Shark (Preceptor) in PCP absence.  Christen Bame, CMA

## 2019-07-24 ENCOUNTER — Other Ambulatory Visit: Payer: Self-pay | Admitting: Family Medicine

## 2019-07-24 DIAGNOSIS — L301 Dyshidrosis [pompholyx]: Secondary | ICD-10-CM

## 2019-07-24 MED ORDER — HYDROXYZINE PAMOATE 25 MG PO CAPS: 25.0000 mg | ORAL_CAPSULE | Freq: Two times a day (BID) | ORAL | 0 refills | Status: DC | PRN

## 2019-08-17 ENCOUNTER — Other Ambulatory Visit: Payer: Self-pay

## 2019-08-17 DIAGNOSIS — F411 Generalized anxiety disorder: Secondary | ICD-10-CM

## 2019-08-17 MED ORDER — CLONAZEPAM 0.5 MG PO TABS: 0.5000 mg | ORAL_TABLET | Freq: Two times a day (BID) | ORAL | 0 refills | Status: DC | PRN

## 2019-08-18 ENCOUNTER — Other Ambulatory Visit: Payer: Self-pay

## 2019-08-18 DIAGNOSIS — I10 Essential (primary) hypertension: Secondary | ICD-10-CM

## 2019-08-18 MED ORDER — HYDROCHLOROTHIAZIDE 25 MG PO TABS: 25.0000 mg | ORAL_TABLET | Freq: Every day | ORAL | 3 refills | Status: DC

## 2019-09-10 ENCOUNTER — Other Ambulatory Visit: Payer: Self-pay | Admitting: Family Medicine

## 2019-09-10 DIAGNOSIS — E039 Hypothyroidism, unspecified: Secondary | ICD-10-CM

## 2019-09-10 DIAGNOSIS — F411 Generalized anxiety disorder: Secondary | ICD-10-CM

## 2019-09-10 NOTE — Telephone Encounter (Signed)
Patient needs another appointment

## 2019-09-17 ENCOUNTER — Other Ambulatory Visit: Payer: Self-pay | Admitting: *Deleted

## 2019-09-17 DIAGNOSIS — L301 Dyshidrosis [pompholyx]: Secondary | ICD-10-CM

## 2019-09-17 MED ORDER — TRIAMCINOLONE ACETONIDE 0.5 % EX OINT: TOPICAL_OINTMENT | CUTANEOUS | 3 refills | Status: DC

## 2019-09-22 ENCOUNTER — Other Ambulatory Visit: Payer: Self-pay | Admitting: *Deleted

## 2019-09-22 DIAGNOSIS — F411 Generalized anxiety disorder: Secondary | ICD-10-CM

## 2019-10-19 ENCOUNTER — Other Ambulatory Visit: Payer: Self-pay | Admitting: Family Medicine

## 2019-10-19 DIAGNOSIS — F411 Generalized anxiety disorder: Secondary | ICD-10-CM

## 2019-10-19 DIAGNOSIS — E119 Type 2 diabetes mellitus without complications: Secondary | ICD-10-CM

## 2019-11-04 ENCOUNTER — Ambulatory Visit (HOSPITAL_COMMUNITY)
Admission: EM | Admit: 2019-11-04 | Discharge: 2019-11-04 | Disposition: A | Discharge status: Home / Self Care | Payer: Medicare Other | Attending: Emergency Medicine | Admitting: Emergency Medicine

## 2019-11-04 ENCOUNTER — Encounter (HOSPITAL_COMMUNITY): Payer: Self-pay | Admitting: Emergency Medicine

## 2019-11-04 ENCOUNTER — Other Ambulatory Visit: Payer: Self-pay

## 2019-11-04 ENCOUNTER — Telehealth (INDEPENDENT_AMBULATORY_CARE_PROVIDER_SITE_OTHER): Payer: Medicare Other | Admitting: Family Medicine

## 2019-11-04 DIAGNOSIS — R079 Chest pain, unspecified: Secondary | ICD-10-CM | POA: Diagnosis not present

## 2019-11-04 DIAGNOSIS — Z20822 Contact with and (suspected) exposure to covid-19: Secondary | ICD-10-CM

## 2019-11-04 DIAGNOSIS — R071 Chest pain on breathing: Secondary | ICD-10-CM

## 2019-11-04 DIAGNOSIS — Z20828 Contact with and (suspected) exposure to other viral communicable diseases: Secondary | ICD-10-CM

## 2019-11-04 NOTE — Discharge Instructions (Addendum)
COVID testing ordered.  It will take between 2-7 days for test results.  Someone will contact you regarding abnormal results.     Advised patient to go to ED for further evaluation.

## 2019-11-04 NOTE — ED Triage Notes (Signed)
Pt here for CP onset Monday... reports pain increases when she breaths... has a towel wrapped around chest for comfort  Denies SOB, fevers....  reports memory problems  A&O x4... no acute distress.

## 2019-11-04 NOTE — ED Provider Notes (Signed)
[name]    CSN: [id number] Arrival date & time: 11/04/19  1228      History   Chief Complaint Chief Complaint  Patient presents with  . Chest Pain    HPI [name] is a 68 y.o. female.   [name] 68 years old female presented to the urgent care for complaint of chest pain while breathing and cough x 3 days.  She stated her chest pain does not radiate and is located on the upper left chest.  Rated the pain at 5 on a scale 1-10.  Denies sick exposure to flu or strep.  Denies recent travel.  Denies aggravating or alleviating symptoms.  Denies previous COVID infection.   Denies fever, chills, fatigue, nasal congestion, rhinorrhea, sore throat,  SOB, wheezing, chest pain, nausea, vomiting, changes in bowel or bladder habits.   The history is provided by the patient. No language interpreter was used.    Past Medical History:  Diagnosis Date  . Anxiety   . Diabetes mellitus without complication ([place])    on metformin  . Hx of adenomatous colonic polyps 01/26/2015  . Memory loss of unknown cause 03/04/2002   suspect depression with psychotic features. patient hospitalized and work-up negative.   . Seizures ([place])    last seizure 4 months ago  . Thyroid disease    thyroid surgery when young    Patient Active Problem List   Diagnosis Date Noted  . 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 Neck pain 09/14/2016  . Annual physical exam 01/25/2016  . Diabetes mellitus ([place]) 04/21/2015  . Hx of adenomatous colonic polyps 01/26/2015  . Bunion 06/30/2014  . Onychomycosis 10/05/2013  . Seizure ([place]) 02/23/2010  . Disturbance in sleep behavior 01/05/2010  . Depression 09/13/2009  . Anxiety state 09/03/2007  . Hypothyroidism 01/23/2007  . Hyperlipidemia 01/23/2007  . Cognitive impairment 01/23/2007  . Migraine headache 01/23/2007  . HYPERTENSION, BENIGN SYSTEMIC 01/23/2007  . DJD, UNSPECIFIED 01/23/2007    Past Surgical History:  Procedure Laterality Date  .  BUNIONECTOMY    . THYROIDECTOMY, PARTIAL    . TONSILLECTOMY      OB History   No obstetric history on file.      Home Medications    Prior to Admission medications   Medication Sig Start Date End Date Taking? Authorizing Provider  aspirin EC 81 MG tablet Take 1 tablet (81 mg total) by mouth daily. 12/18/18   Glenis Smoker, MD  atorvastatin (LIPITOR) 40 MG tablet Take 1 tablet (40 mg total) by mouth daily at 6 PM. 12/18/18   Glenis Smoker, MD  citalopram (CELEXA) 20 MG tablet Take 1 tablet (20 mg total) by mouth daily. 12/18/18   Glenis Smoker, MD  clonazePAM Bobbye [place]) 0.5 MG tablet Take 1 tablet by mouth twice daily as needed for anxiety 10/19/19   Meccariello, Bernita Raisin, DO  hydrochlorothiazide (HYDRODIURIL) 25 MG tablet Take 1 tablet (25 mg total) by mouth daily. 08/18/19   Patriciaann Clan, DO  hydrOXYzine (VISTARIL) 25 MG capsule Take 1 capsule (25 mg total) by mouth 2 (two) times daily as needed. 07/24/19   Meccariello, Bernita Raisin, DO  levETIRAcetam (KEPPRA) 500 MG tablet Take 3 tablets (1,500 mg total) by mouth 2 (two) times daily. 07/26/17 07/26/18  Mercy Riding, MD  levothyroxine (SYNTHROID) 75 MCG tablet TAKE 1 TABLET BY MOUTH ONCE DAILY FOR  HYPOTHYROIDISM 09/10/19   Meccariello, Bernita Raisin, DO  metFORMIN (GLUCOPHAGE) 500 MG tablet TAKE 1 TABLET  BY MOUTH TWICE DAILY WITH  A  MEAL 10/19/19   Meccariello, Bernita Raisin, DO  QUEtiapine (SEROQUEL) 50 MG tablet Take 0.5 tablets (25 mg total) by mouth at bedtime. 06/16/19   Meccariello, Bernita Raisin, DO  SUMAtriptan (IMITREX) 25 MG tablet Take 1 tablet (25 mg total) by mouth every 2 (two) hours as needed for migraine. May repeat in 2 hours if headache persists or recurs. 02/10/19   Glenis Smoker, MD  triamcinolone ointment (KENALOG) 0.5 % APPLY ONE APPLICATION TOPICALLY TWO TIMES DAILY 09/17/19   Meccariello, Bernita Raisin, DO    Family History Family History  Problem Relation Age of Onset  . Colon cancer Neg Hx   . Rectal  cancer Neg Hx   . Stomach cancer Neg Hx     Social History Social History   Tobacco Use  . Smoking status: Never Smoker  . Smokeless tobacco: Never Used  Substance Use Topics  . Alcohol use: No    Alcohol/week: 0.0 standard drinks  . Drug use: No     Allergies   Chocolate, Coffee bean extract [coffea arabica], and Peanut-containing drug products   Review of Systems Review of Systems  Constitutional: Negative.   HENT: Negative.   Respiratory: Positive for cough.   Cardiovascular: Positive for chest pain.  Gastrointestinal: Negative.   ROS: All other are negatives   Physical Exam Triage Vital Signs ED Triage Vitals  Enc Vitals Group     BP      Pulse      Resp      Temp      Temp src      SpO2      Weight      Height      Head Circumference      Peak Flow      Pain Score      Pain Loc      Pain Edu?      Excl. in [place]?    No data found.  Updated Vital Signs BP (!) 154/77 (BP Location: Right Arm)   Pulse 73   Temp 98.1 F (36.7 C) (Oral)   Resp 16   SpO2 100%   Visual Acuity Right Eye Distance:   Left Eye Distance:   Bilateral Distance:    Right Eye Near:   Left Eye Near:    Bilateral Near:     Physical Exam Vitals signs and nursing note reviewed.  Constitutional:      General: She is not in acute distress.    Appearance: She is well-developed and normal weight. She is not ill-appearing or toxic-appearing.  HENT:     Head: Normocephalic.     Right Ear: Tympanic membrane, ear canal and external ear normal. There is no impacted cerumen.     Left Ear: Tympanic membrane, ear canal and external ear normal. There is no impacted cerumen.  Cardiovascular:     Rate and Rhythm: Normal rate and regular rhythm.     Heart sounds: Normal heart sounds. Heart sounds not distant. No systolic murmur.  Pulmonary:     Effort: Pulmonary effort is normal. No tachypnea, accessory muscle usage or respiratory distress.  Chest:     Chest wall: No mass, deformity,  tenderness or crepitus.  Abdominal:     General: Bowel sounds are normal. There is no abdominal bruit.     Palpations: Abdomen is soft.  Neurological:     Mental Status: She is alert.      UC Treatments /  Results  Labs (all labs ordered are listed, but only abnormal results are displayed) Labs Reviewed  NOVEL CORONAVIRUS, NAA (HOSP ORDER, SEND-OUT TO REF LAB; TAT 18-24 HRS)    EKG   Radiology No results found.  Procedures Procedures (including critical care time)  Medications Ordered in UC Medications - No data to display  Initial Impression / Assessment and Plan / UC Course  I have reviewed the triage vital signs and the nursing notes.  Pertinent labs & imaging results that were available during my care of the patient were reviewed by me and considered in my medical decision making (see chart for details).     EKG interpretation reveal sinus rhythm with abnormal ST wave.  Patient does not have any  previous EKG on file for comparison.  Advised patient to go to the ED for further evaluation. Final Clinical Impressions(s) / UC Diagnoses   Final diagnoses:  Chest pain on breathing  Suspected COVID-19 virus infection     Discharge Instructions     COVID testing ordered.  It will take between 2-7 days for test results.  Someone will contact you regarding abnormal results.     Advised patient to go to ED for further evaluation.    ED Prescriptions    None     PDMP not reviewed this encounter.   Emerson Monte, [place] 11/04/19 [phone]

## 2019-11-04 NOTE — Progress Notes (Signed)
[place] Telemedicine Visit  Patient consented to have visit conducted via telephone  call. Two separate patient identifiers used to verify identity.  Encounter participants: Patient: [name]  Provider: Dorcas Mcmurray  Others (if applicable): none  Chief Complaint / HPI:  I woke in the middle the night with chest pain.  Initially she said it was her right chest and then later in the interview she said her left chest.  She admits to having memory problems at times.  Chest pain was 9 out of 10.  Radiated a little bit to the upper back.  No nausea.  No shortness of breath.  No sweating with the.  Of chest pain.  It improved significantly after about 30 minutes but has not totally gone away.  She is feeling some left-sided chest pain now 2-3 out of 10.  Has not had pain like this before.  Is also having some cough for the last 1 to 2 days.  Says it is not a lot of coughing but since the episode of chest pain in the middle of the night the cough really hurts more.  She is not had fevers, chills or sweats.  ROS:   No unusual weight change No abdominal pain  OBJECTIVE observed via telephone device: PSYCH: AxOx4.  Appropriate speech fluency and content. Asks and answers questions appropriately. Mood is congruent. RESPIRATORY: no unusual sounds of labored breathing    Pertinent PMHx / PSHx:  I have reviewed the patient's medications, allergies, past medical and surgical history, smoking status and updated in the EMR as appropriate.  History of hypertension, hyperlipidemia, cognitive impairment, hypothyroidism. Assessment/Plan: #1.  Chest pain: This is concerning for ACS.  Alternatively she could have pneumonia or potentially Covid infection.  Either way, she needs to be seen for in person evaluation, consider chest x-ray, consider EKG.  I explained this to her and her husband in some detail.  I recommended she be seen at the urgent care.  I recommended she go now.   She agreed.  If for some reason they cannot see her at urgent care, I recommend she be seen at the emergency department. No problem-specific Assessment & Plan notes found for this encounter.    Time spent on phone or on video call with patient: 15 minutes

## 2019-11-06 LAB — NOVEL CORONAVIRUS, NAA (HOSP ORDER, SEND-OUT TO REF LAB; TAT 18-24 HRS): SARS-CoV-2, NAA: NOT DETECTED

## 2019-11-16 ENCOUNTER — Other Ambulatory Visit: Payer: Self-pay

## 2019-11-16 DIAGNOSIS — E119 Type 2 diabetes mellitus without complications: Secondary | ICD-10-CM

## 2019-11-16 NOTE — Telephone Encounter (Signed)
Pt requesting a 90 day supply. Ottis Stain, CMA

## 2019-11-17 MED ORDER — METFORMIN HCL 500 MG PO TABS: 500.0000 mg | ORAL_TABLET | Freq: Two times a day (BID) | ORAL | 1 refills | Status: DC

## 2019-11-19 ENCOUNTER — Other Ambulatory Visit: Payer: Self-pay | Admitting: Family Medicine

## 2019-11-19 DIAGNOSIS — G479 Sleep disorder, unspecified: Secondary | ICD-10-CM

## 2019-11-19 DIAGNOSIS — F411 Generalized anxiety disorder: Secondary | ICD-10-CM

## 2019-12-07 ENCOUNTER — Telehealth: Payer: Self-pay

## 2019-12-07 ENCOUNTER — Other Ambulatory Visit: Payer: Self-pay

## 2019-12-07 ENCOUNTER — Other Ambulatory Visit: Payer: Self-pay | Admitting: Family Medicine

## 2019-12-07 DIAGNOSIS — E039 Hypothyroidism, unspecified: Secondary | ICD-10-CM

## 2019-12-07 NOTE — Telephone Encounter (Signed)
[place] per Dr. Sandi Carne and gave ok to change manufactures of levothyroxine. Ottis Stain, CMA

## 2019-12-07 NOTE — Telephone Encounter (Signed)
[name] is needing prescribes approval to change manufacturers of levothyroxine. Please advise.

## 2019-12-07 NOTE — Telephone Encounter (Signed)
Called pt. LM with husband for pt  To call our office. If pt calls please let her know Dr. Harrel Carina refilled her Rx for 30 days, she will need  schedule appt for future refills. Ottis Stain, CMA

## 2019-12-07 NOTE — Telephone Encounter (Signed)
Needs appointment for TSH

## 2019-12-07 NOTE — Telephone Encounter (Signed)
Called pt LM with husband to have pt call. If pt calls, please have her schedule an appt for TSH. Ottis Stain, CMA

## 2019-12-16 ENCOUNTER — Other Ambulatory Visit: Payer: Self-pay | Admitting: *Deleted

## 2019-12-16 MED ORDER — SUMATRIPTAN SUCCINATE 25 MG PO TABS: 25.0000 mg | ORAL_TABLET | ORAL | 0 refills | Status: DC | PRN

## 2019-12-17 ENCOUNTER — Telehealth: Payer: Self-pay | Admitting: *Deleted

## 2019-12-17 NOTE — Telephone Encounter (Signed)
Received fax from pharmacy, PA needed on sumatriptan 25 mg.  Clinical questions submitted via Cover My Meds.  Waiting on response, could take up to 72 hours.  Cover My Meds info: Key: Shanon Ace, CMA

## 2019-12-21 ENCOUNTER — Other Ambulatory Visit: Payer: Self-pay | Admitting: Family Medicine

## 2019-12-21 DIAGNOSIS — G43909 Migraine, unspecified, not intractable, without status migrainosus: Secondary | ICD-10-CM

## 2019-12-21 MED ORDER — SUMATRIPTAN SUCCINATE 25 MG PO TABS: 25.0000 mg | ORAL_TABLET | ORAL | 0 refills | Status: DC | PRN

## 2019-12-21 NOTE — Progress Notes (Signed)
Info for prior auth received.  Patient approved for 9 tabs a month, rx order changed to reflect this.  If she requires more, will complete prior auth.

## 2019-12-21 NOTE — Telephone Encounter (Signed)
   Pharmacy informed. Tery Hoeger, CMA  

## 2019-12-23 ENCOUNTER — Other Ambulatory Visit: Payer: Self-pay | Admitting: Family Medicine

## 2019-12-23 DIAGNOSIS — F411 Generalized anxiety disorder: Secondary | ICD-10-CM

## 2019-12-23 MED ORDER — CLONAZEPAM 0.5 MG PO TABS: 0.5000 mg | ORAL_TABLET | Freq: Two times a day (BID) | ORAL | 0 refills | Status: DC | PRN

## 2019-12-23 NOTE — Telephone Encounter (Signed)
Spoke with patient.  She was unaware of her appointment on 2/5 (unsure if maybe her husband was spoken to, and patient also has a memory problem).  She reports she will come to appointment and understands that klonopin will only be refilled until 2/5.  She reports she takes it every day, twice a day, therefore not prescribing would likely increase risk of withdraw.  States that she has enough through Saturday 1/30.  Will send Rx to start Sunday 1/31 and enough through 2/5.

## 2019-12-23 NOTE — Telephone Encounter (Signed)
Pt scheduled appointment for follow up Friday, Feb 5th at 3:10. Patient requesting supply of medication to hold her until scheduled appointment.   To PCP  Talbot Grumbling, RN

## 2019-12-24 ENCOUNTER — Other Ambulatory Visit: Payer: Self-pay | Admitting: Family Medicine

## 2019-12-24 DIAGNOSIS — F411 Generalized anxiety disorder: Secondary | ICD-10-CM

## 2019-12-28 ENCOUNTER — Other Ambulatory Visit: Payer: Self-pay | Admitting: Family Medicine

## 2019-12-28 DIAGNOSIS — L301 Dyshidrosis [pompholyx]: Secondary | ICD-10-CM

## 2019-12-30 ENCOUNTER — Other Ambulatory Visit: Payer: Self-pay | Admitting: Family Medicine

## 2019-12-30 DIAGNOSIS — L301 Dyshidrosis [pompholyx]: Secondary | ICD-10-CM

## 2019-12-30 MED ORDER — HYDROXYZINE PAMOATE 25 MG PO CAPS: ORAL_CAPSULE | ORAL | 0 refills | Status: DC

## 2019-12-30 NOTE — Addendum Note (Signed)
Addended by: Talbot Grumbling on: 12/30/2019 05:10 PM   Modules accepted: Orders

## 2020-01-01 ENCOUNTER — Telehealth (INDEPENDENT_AMBULATORY_CARE_PROVIDER_SITE_OTHER): Payer: Medicare Other | Admitting: Family Medicine

## 2020-01-01 ENCOUNTER — Other Ambulatory Visit: Payer: Self-pay

## 2020-01-01 ENCOUNTER — Encounter: Payer: Self-pay | Admitting: Family Medicine

## 2020-01-01 DIAGNOSIS — L301 Dyshidrosis [pompholyx]: Secondary | ICD-10-CM | POA: Diagnosis not present

## 2020-01-01 DIAGNOSIS — E119 Type 2 diabetes mellitus without complications: Secondary | ICD-10-CM

## 2020-01-01 DIAGNOSIS — E039 Hypothyroidism, unspecified: Secondary | ICD-10-CM

## 2020-01-01 DIAGNOSIS — E785 Hyperlipidemia, unspecified: Secondary | ICD-10-CM

## 2020-01-01 DIAGNOSIS — F411 Generalized anxiety disorder: Secondary | ICD-10-CM | POA: Diagnosis not present

## 2020-01-01 DIAGNOSIS — I10 Essential (primary) hypertension: Secondary | ICD-10-CM

## 2020-01-01 MED ORDER — HYDROXYZINE HCL 25 MG PO TABS: 25.0000 mg | ORAL_TABLET | Freq: Two times a day (BID) | ORAL | 3 refills | Status: DC | PRN

## 2020-01-01 MED ORDER — TRIAMCINOLONE ACETONIDE 0.5 % EX OINT: TOPICAL_OINTMENT | CUTANEOUS | 3 refills | Status: DC

## 2020-01-01 NOTE — Assessment & Plan Note (Signed)
Patient has been on longstanding benzos for quite some time.  She seems to do very well with this.  Her underlying brain injury is attributed to her poorly controlled anxiety.  Seems to be stable at this time however.  With her current regimen she is taking about 0.75 mg of Klonopin daily.  Can continue with this current regimen given that she is doing well with it.

## 2020-01-01 NOTE — Progress Notes (Signed)
[name]  Patient consented to have virtual visit. Method of visit: Telephone  Encounter participants: Patient: [name] - located at home Provider: Cleophas Dunker - located at [facility] Others (if applicable): Daughter  Chief Complaint: Needing medication refills  HPI:  [name] is a 69 y.o. female who presents today to discuss the following:  Hypothyroidism Weight is stable.  Feels like she is chronically fatigued.  Daughter reports she is at baseline.  Appetite has been okay.  Itching and Eczema Thinks she has been doing well.  Her husband has been in the hospital.  She has an Alexa to remind her to take medication.  She is itching all over her body.  Reports coffee and chocolate make her itch and she takes hydroxyzine seems to help with this.  Also needs triamcinolone cream.  Anxiety She is on clonazepam 1/2 tablet at 12pm, then 1/2 tablet at night, she also does 1/4 tablet at 9am, and 1/4 at 5pm.  Daughter reports that she does very well with this regimen.  No SI.  She has not been taking seroquel the last two weeks while her husband has been in the hospital because it wasn't on the list her daughter was looking at.  She has been sleeping well without it.  HLD  On atorvastatin 40mg  daily. Taking and doing well.  No CP or SOB.  Diabetes On metformin.  Does not check sugars regularly.  Tries to watch what she eats.  HTN HCTZ 25mg .  Doesn't check BP regularly.  Denies chest pain shortness of breath.  ROS: per HPI  Pertinent PMHx: Per above  Exam:  Respiratory: Breathing comfortably on room air, no evidence of respiratory distress over the phone  Assessment/Plan:  Hypothyroidism Currently on Synthroid.  Has not had TSH checked in over a year.  Placed future order.  Advised to collect in the next few weeks and present in 4 weeks for in person follow-up.  Pompholyx eczema Does well with hydroxyzine.  Is  requesting tablets instead of capsules because they are easy for her to cut in half.  Refill sent for this.  Advised patient to not pick up capsules from the pharmacy.  Refill sent for triamcinolone cream as well.  Anxiety state Patient has been on longstanding benzos for quite some time.  She seems to do very well with this.  Her underlying brain injury is attributed to her poorly controlled anxiety.  Seems to be stable at this time however.  With her current regimen she is taking about 0.75 mg of Klonopin daily.  Can continue with this current regimen given that she is doing well with it.  Hyperlipidemia Chronic, stable.  Has been on atorvastatin 40 mg daily.  No recent lipid panel.  Will order for future order.  Patient advised to be fasting when she comes in for this.  Diabetes mellitus On Metformin.  Doing well with this.  Does not check her sugars daily.  Has not had an A1c in over a year.  Will order A1c for future order and also repeat BMP.  Patient advised to come in for in person visit in 1 month.  HYPERTENSION, BENIGN SYSTEMIC Chronic, stable.  Patient does not check blood pressures at home.  Currently on hydrochlorothiazide 25 mg.  Advised to come in for in person visit in 1 month.  Will obtain BMP prior to this.    Time spent during visit with patient: 13 minutes

## 2020-01-01 NOTE — Assessment & Plan Note (Signed)
Chronic, stable.  Patient does not check blood pressures at home.  Currently on hydrochlorothiazide 25 mg.  Advised to come in for in person visit in 1 month.  Will obtain BMP prior to this.

## 2020-01-01 NOTE — Assessment & Plan Note (Signed)
On Metformin.  Doing well with this.  Does not check her sugars daily.  Has not had an A1c in over a year.  Will order A1c for future order and also repeat BMP.  Patient advised to come in for in person visit in 1 month.

## 2020-01-01 NOTE — Assessment & Plan Note (Signed)
Currently on Synthroid.  Has not had TSH checked in over a year.  Placed future order.  Advised to collect in the next few weeks and present in 4 weeks for in person follow-up.

## 2020-01-01 NOTE — Assessment & Plan Note (Addendum)
Chronic, stable.  Has been on atorvastatin 40 mg daily.  No recent lipid panel.  Will order for future order.  Patient advised to be fasting when she comes in for this.

## 2020-01-01 NOTE — Assessment & Plan Note (Signed)
Does well with hydroxyzine.  Is requesting tablets instead of capsules because they are easy for her to cut in half.  Refill sent for this.  Advised patient to not pick up capsules from the pharmacy.  Refill sent for triamcinolone cream as well.

## 2020-01-02 ENCOUNTER — Other Ambulatory Visit: Payer: Self-pay | Admitting: Family Medicine

## 2020-01-02 DIAGNOSIS — F411 Generalized anxiety disorder: Secondary | ICD-10-CM

## 2020-01-04 ENCOUNTER — Other Ambulatory Visit: Payer: Self-pay | Admitting: Family Medicine

## 2020-01-04 DIAGNOSIS — E039 Hypothyroidism, unspecified: Secondary | ICD-10-CM

## 2020-01-04 NOTE — Telephone Encounter (Signed)
PMP reviewed without red flags.  Last refill was for 7 days.

## 2020-01-20 ENCOUNTER — Other Ambulatory Visit: Payer: Self-pay | Admitting: *Deleted

## 2020-01-20 DIAGNOSIS — F3289 Other specified depressive episodes: Secondary | ICD-10-CM

## 2020-01-21 MED ORDER — CITALOPRAM HYDROBROMIDE 20 MG PO TABS: 20.0000 mg | ORAL_TABLET | Freq: Every day | ORAL | 1 refills | Status: DC

## 2020-01-22 ENCOUNTER — Ambulatory Visit: Payer: Medicare Other | Attending: Internal Medicine

## 2020-01-22 DIAGNOSIS — Z23 Encounter for immunization: Secondary | ICD-10-CM | POA: Insufficient documentation

## 2020-01-22 NOTE — Progress Notes (Signed)
   Covid-19 Vaccination Clinic  Name:  [name]    MRN: [id number] DOB: March 18, 1951  01/22/2020  [name] was observed post Covid-19 immunization for 15 minutes without incidence. She was provided with Vaccine Information Sheet and instruction to access the V-Safe system.   [name] was instructed to call 911 with any severe reactions post vaccine: Marland Kitchen Difficulty breathing  . Swelling of your face and throat  . A fast heartbeat  . A bad rash all over your body  . Dizziness and weakness    Immunizations Administered    Name Date Dose VIS Date Route   Pfizer COVID-19 Vaccine 01/22/2020  2:34 PM 0.3 mL 11/06/2019 Intramuscular   Manufacturer: [place]   Lot: [id number]   [place]: [id number]

## 2020-01-29 ENCOUNTER — Other Ambulatory Visit: Payer: Self-pay | Admitting: Family Medicine

## 2020-01-29 DIAGNOSIS — F411 Generalized anxiety disorder: Secondary | ICD-10-CM

## 2020-01-29 NOTE — Telephone Encounter (Signed)
PMP reviewed, no red flags.  Last seen in February.

## 2020-02-02 ENCOUNTER — Other Ambulatory Visit: Payer: Self-pay

## 2020-02-02 DIAGNOSIS — E785 Hyperlipidemia, unspecified: Secondary | ICD-10-CM

## 2020-02-02 DIAGNOSIS — G43909 Migraine, unspecified, not intractable, without status migrainosus: Secondary | ICD-10-CM

## 2020-02-02 MED ORDER — SUMATRIPTAN SUCCINATE 25 MG PO TABS: 25.0000 mg | ORAL_TABLET | ORAL | 0 refills | Status: DC | PRN

## 2020-02-02 MED ORDER — ATORVASTATIN CALCIUM 40 MG PO TABS: 40.0000 mg | ORAL_TABLET | Freq: Every day | ORAL | 2 refills | Status: DC

## 2020-02-08 ENCOUNTER — Other Ambulatory Visit: Payer: Self-pay | Admitting: Family Medicine

## 2020-02-08 DIAGNOSIS — F411 Generalized anxiety disorder: Secondary | ICD-10-CM

## 2020-02-11 ENCOUNTER — Other Ambulatory Visit: Payer: Self-pay

## 2020-02-11 DIAGNOSIS — G43909 Migraine, unspecified, not intractable, without status migrainosus: Secondary | ICD-10-CM

## 2020-02-12 MED ORDER — SUMATRIPTAN SUCCINATE 25 MG PO TABS: 25.0000 mg | ORAL_TABLET | ORAL | 0 refills | Status: DC | PRN

## 2020-02-17 ENCOUNTER — Ambulatory Visit: Payer: Medicare Other | Attending: Internal Medicine

## 2020-02-17 DIAGNOSIS — Z23 Encounter for immunization: Secondary | ICD-10-CM

## 2020-02-17 NOTE — Progress Notes (Signed)
   Covid-19 Vaccination Clinic  Name:  [name]    MRN: [id number] DOB: 1951/01/02  02/17/2020  [name] was observed post Covid-19 immunization for 15 minutes without incident. She was provided with Vaccine Information Sheet and instruction to access the V-Safe system.   [name] was instructed to call 911 with any severe reactions post vaccine: Marland Kitchen Difficulty breathing  . Swelling of face and throat  . A fast heartbeat  . A bad rash all over body  . Dizziness and weakness   Immunizations Administered    Name Date Dose VIS Date Route   Pfizer COVID-19 Vaccine 02/17/2020 11:48 AM 0.3 mL 11/06/2019 Intramuscular   Manufacturer: [place]   Lot: [id number]   [place]: [id number]

## 2020-03-03 ENCOUNTER — Other Ambulatory Visit: Payer: Self-pay

## 2020-03-03 DIAGNOSIS — F411 Generalized anxiety disorder: Secondary | ICD-10-CM

## 2020-03-03 MED ORDER — CLONAZEPAM 0.5 MG PO TABS: 0.5000 mg | ORAL_TABLET | Freq: Two times a day (BID) | ORAL | 0 refills | Status: DC | PRN

## 2020-03-03 NOTE — Telephone Encounter (Signed)
Patients daughter, Andee Poles, calls nurse line requesting the medication that "helps her mom with her anxiety." Andee Poles stated her mother has been acting erradiactly, trying to kick her step dad out, trying to sell their home, and buy new cars." I am assuming Andee Poles is talking about Clonazepam, per chart review, looks like she is due for a refill. However, they did request a refill too soon on 03/15. I attempted to call patient and her husband to get more information on 3/15 request, however no answer. Danielle requesting a refill on Clonzepam. If unable to provide, Andee Poles would like to know why. Please advise.

## 2020-03-03 NOTE — Telephone Encounter (Signed)
Informed [name]. Advised the next step is to make an apt. [name] will call the office to scheduled when she looks at her work scheduled.

## 2020-03-03 NOTE — Telephone Encounter (Signed)
Unsure why they think this was discontinued.  It looks like she had an appointment with Neurology in March and told him the same, but during our visit in February I had said we would continue.  Will send another Rx for this.  It sounds like she might have some other things going on, though, so I'd recommend that she make an appointment so we can talk about these different behaviors.

## 2020-04-19 ENCOUNTER — Telehealth: Payer: Self-pay | Admitting: *Deleted

## 2020-04-19 NOTE — Telephone Encounter (Signed)
Received a fax request for Triamcinolon 0.1% cream.  Contacted pt because she has refills of the ointment 0.5%there at pharmacy,  and she said that the ointment causes it to feel like it is burning her and she has to take a shower right after applying it.  She would like the other kind she described it as a paste and not an ointment.Maico Mulvehill Zimmerman Rumple, CMA

## 2020-04-26 ENCOUNTER — Other Ambulatory Visit: Payer: Self-pay | Admitting: Family Medicine

## 2020-04-26 DIAGNOSIS — L301 Dyshidrosis [pompholyx]: Secondary | ICD-10-CM

## 2020-04-26 DIAGNOSIS — F411 Generalized anxiety disorder: Secondary | ICD-10-CM

## 2020-04-26 MED ORDER — TRIAMCINOLONE ACETONIDE 0.1 % EX CREA: 1.0000 "application " | TOPICAL_CREAM | Freq: Two times a day (BID) | CUTANEOUS | 1 refills | Status: DC

## 2020-04-26 NOTE — Telephone Encounter (Signed)
Oddly enough, I must have missed this somehow!  I have sent the Rx for cream instead.  Thank you for checking!

## 2020-04-26 NOTE — Progress Notes (Signed)
Patient prefers kenalog cream to ointment

## 2020-04-27 NOTE — Telephone Encounter (Signed)
She needs a visit in order to prescribe the Klonopin.  It can be with any provider, but it has been >3 months since her last visit for this.

## 2020-04-29 ENCOUNTER — Telehealth: Payer: Self-pay | Admitting: *Deleted

## 2020-04-29 NOTE — Telephone Encounter (Signed)
Contacted pt and appointment scheduled per Dr. Sandi Carne in refill request message.Jame Morrell Katharina Caper, [place]'

## 2020-05-02 ENCOUNTER — Encounter: Payer: Self-pay | Admitting: Family Medicine

## 2020-05-02 ENCOUNTER — Other Ambulatory Visit: Payer: Self-pay

## 2020-05-02 ENCOUNTER — Ambulatory Visit (INDEPENDENT_AMBULATORY_CARE_PROVIDER_SITE_OTHER): Payer: Medicare Other | Admitting: Family Medicine

## 2020-05-02 VITALS — BP 158/72 | HR 66 | Ht 64.0 in | Wt 162.0 lb

## 2020-05-02 DIAGNOSIS — I10 Essential (primary) hypertension: Secondary | ICD-10-CM | POA: Diagnosis not present

## 2020-05-02 DIAGNOSIS — E119 Type 2 diabetes mellitus without complications: Secondary | ICD-10-CM

## 2020-05-02 DIAGNOSIS — E039 Hypothyroidism, unspecified: Secondary | ICD-10-CM | POA: Diagnosis not present

## 2020-05-02 DIAGNOSIS — F411 Generalized anxiety disorder: Secondary | ICD-10-CM | POA: Diagnosis not present

## 2020-05-02 DIAGNOSIS — Z1231 Encounter for screening mammogram for malignant neoplasm of breast: Secondary | ICD-10-CM | POA: Diagnosis not present

## 2020-05-02 DIAGNOSIS — Z1211 Encounter for screening for malignant neoplasm of colon: Secondary | ICD-10-CM | POA: Diagnosis not present

## 2020-05-02 DIAGNOSIS — E785 Hyperlipidemia, unspecified: Secondary | ICD-10-CM

## 2020-05-02 LAB — POCT GLYCOSYLATED HEMOGLOBIN (HGB A1C): HbA1c, POC (controlled diabetic range): 6.4 % (ref 0.0–7.0)

## 2020-05-02 MED ORDER — CLONAZEPAM 0.5 MG PO TABS: 0.5000 mg | ORAL_TABLET | Freq: Two times a day (BID) | ORAL | 0 refills | Status: DC | PRN

## 2020-05-02 MED ORDER — LISINOPRIL 5 MG PO TABS: 5.0000 mg | ORAL_TABLET | Freq: Every day | ORAL | 0 refills | Status: DC

## 2020-05-02 NOTE — Patient Instructions (Addendum)
Thank you for coming to see me today. It was a pleasure. Today we talked about:   We will check blood work today.  I will call you if blood work is abnormal, otherwise I will send a letter.  You are due for a colonoscopy.  Please use the form that we have given you to schedule this at your convenience.   I have placed an order for your mammogram.  Please call [facility] to schedule your appointment.  Make sure you go to the eye doctor soon.  We started a new medication called Lisinopril.  It will be for blood pressure and diabetes.  Come back in 6 weeks for bloodwork only.  You don't need an appointment.  Please follow-up with me in 3 months or sooner as needed.  If you have any questions or concerns, please do not hesitate to call the office at [phone].  Best,   [place] Constable, DO

## 2020-05-02 NOTE — Assessment & Plan Note (Signed)
Has been well controlled on Euthyrox 75 mcg daily.  Will order TSH today.

## 2020-05-02 NOTE — Assessment & Plan Note (Signed)
Colonoscopy ordered.  Patient is due for 1 in 2019.

## 2020-05-02 NOTE — Assessment & Plan Note (Addendum)
A1c well controlled today at 6.4.  Continue Metformin 500 mg twice daily.  Patient currently on statin.  Will start lisinopril 5 mg for renal protection as well as improvement of hypertension given blood pressure not at goal today.  Check BMP today and repeat in 6 weeks.  Advised to follow-up for eye exam.

## 2020-05-02 NOTE — Assessment & Plan Note (Signed)
Mammogram ordered.  Patient given paper.

## 2020-05-02 NOTE — Assessment & Plan Note (Signed)
BP not at goal today.  Reports that usually lower than this, but suspect that she is still not at goal at home.  We will continue with hydrochlorothiazide 25 mg daily and add on lisinopril 5 mg given patient also has diabetes.  Will check BMP today.  Plan for repeat in 6 weeks.

## 2020-05-02 NOTE — Assessment & Plan Note (Signed)
PMP reviewed.  No red flags.  Can continue patient on Klonopin as she seems very appropriate and this helps her.  Can refill Klonopin for 3 months.  We will also continue hydroxyzine as needed.  Patient and her husband both report that she does not get sleepy on this medication.  She is also not alone during the day.

## 2020-05-02 NOTE — Progress Notes (Signed)
    SUBJECTIVE:   CHIEF COMPLAINT / HPI:   Diabetes Current regimen: Metformin 500 mg twice daily Does not check CBGs A1c today 6.4 On statin, not on ACE or ARB Has been about 1.5 yrs since last eye exam  Hypothyroidism Currently on Synthroid Has been well controlled TSH has not been checked in over a year  Hypertension Currently on Hydrochlorothiazide 25 mg daily Last BMP July 2019  Hyperlipidemia Currently on Lipitor 40 mg daily Denies chest pain, shortness of breath Last lipid panel July 2019, at that time LDL 107  Anxiety Has a history of an underlying brain injury that contributes to her poorly controlled anxiety Currently on Klonopin, takes 1/2 tablet every 4 hrs which helpskeep her anxiety well controlled Also taking hydroxyzine 25mg  BID Husband fixes medications for her because she has chronic memory issues from the brain injury as well Patient and husband report that medication does not cause her to be too sleepy Someone is with her all day Took herself off celexa, didn't like the way it made her feel Husband thinks she is taking seroquel but isn't sure and needs to check when he gets home  Eye Twitch Had a brother with parkinson's  Not happening currently Comes and goes Just notices "movement" Thinks sometimes her lip is jumping  PERTINENT  PMH / PSH: Anxiety, seizures, T2DM, hypertension, hyperlipidemia  OBJECTIVE:   BP (!) 158/72   Pulse 66   Ht 5\' 4"  (1.626 m)   Wt 162 lb (73.5 kg)   SpO2 97%   BMI 27.81 kg/m    Physical Exam:  General: 69 y.o. female in NAD Neck: Supple, no thyromegaly Cardio: RRR no m/r/g Lungs: CTAB, no wheezing, no rhonchi, no crackles, no IWOB on RA Skin: warm and dry Extremities: No edema   ASSESSMENT/PLAN:   Diabetes mellitus A1c well controlled today at 6.4.  Continue Metformin 500 mg twice daily.  Patient currently on statin.  Will start lisinopril 5 mg for renal protection as well as improvement of  hypertension given blood pressure not at goal today.  Check BMP today and repeat in 6 weeks.  Advised to follow-up for eye exam.  Hypothyroidism Has been well controlled on Euthyrox 75 mcg daily.  Will order TSH today.  HYPERTENSION, BENIGN SYSTEMIC BP not at goal today.  Reports that usually lower than this, but suspect that she is still not at goal at home.  We will continue with hydrochlorothiazide 25 mg daily and add on lisinopril 5 mg given patient also has diabetes.  Will check BMP today.  Plan for repeat in 6 weeks.  Anxiety state PMP reviewed.  No red flags.  Can continue patient on Klonopin as she seems very appropriate and this helps her.  Can refill Klonopin for 3 months.  We will also continue hydroxyzine as needed.  Patient and her husband both report that she does not get sleepy on this medication.  She is also not alone during the day.  Screen for colon cancer Colonoscopy ordered.  Patient is due for 1 in 2019.  Encounter for screening mammogram for malignant neoplasm of breast Mammogram ordered.  Patient given paper.     Cleophas Dunker, [place]

## 2020-05-03 ENCOUNTER — Encounter: Payer: Self-pay | Admitting: Family Medicine

## 2020-05-03 LAB — BASIC METABOLIC PANEL
BUN/Creatinine Ratio: 7 — ABNORMAL LOW (ref 12–28)
BUN: 5 mg/dL — ABNORMAL LOW (ref 8–27)
CO2: 30 mmol/L — ABNORMAL HIGH (ref 20–29)
Calcium: 9.5 mg/dL (ref 8.7–10.3)
Chloride: 98 mmol/L (ref 96–106)
Creatinine, Ser: 0.74 mg/dL (ref 0.57–1.00)
GFR calc Af Amer: 96 mL/min/{1.73_m2} (ref >59)
GFR calc non Af Amer: 83 mL/min/{1.73_m2} (ref >59)
Glucose: 112 mg/dL — ABNORMAL HIGH (ref 65–99)
Potassium: 3.5 mmol/L (ref 3.5–5.2)
Sodium: 141 mmol/L (ref 134–144)

## 2020-05-03 LAB — LIPID PANEL
Chol/HDL Ratio: 2.2 ratio (ref 0.0–4.4)
Cholesterol, Total: 181 mg/dL (ref 100–199)
HDL: 83 mg/dL (ref >39)
LDL Chol Calc (NIH): 83 mg/dL (ref 0–99)
Triglycerides: 80 mg/dL (ref 0–149)
VLDL Cholesterol Cal: 15 mg/dL (ref 5–40)

## 2020-05-03 LAB — TSH: TSH: 3.33 u[IU]/mL (ref 0.450–4.500)

## 2020-05-03 NOTE — Progress Notes (Signed)
Letter sent.

## 2020-05-06 ENCOUNTER — Encounter: Payer: Self-pay | Admitting: Internal Medicine

## 2020-05-09 ENCOUNTER — Other Ambulatory Visit: Payer: Self-pay | Admitting: Family Medicine

## 2020-05-09 DIAGNOSIS — G479 Sleep disorder, unspecified: Secondary | ICD-10-CM

## 2020-05-22 ENCOUNTER — Other Ambulatory Visit: Payer: Self-pay | Admitting: Family Medicine

## 2020-05-22 DIAGNOSIS — F411 Generalized anxiety disorder: Secondary | ICD-10-CM

## 2020-05-31 ENCOUNTER — Other Ambulatory Visit: Payer: Self-pay | Admitting: Family Medicine

## 2020-05-31 DIAGNOSIS — E119 Type 2 diabetes mellitus without complications: Secondary | ICD-10-CM

## 2020-06-15 ENCOUNTER — Ambulatory Visit (INDEPENDENT_AMBULATORY_CARE_PROVIDER_SITE_OTHER): Payer: Medicare Other | Admitting: Family Medicine

## 2020-06-15 ENCOUNTER — Other Ambulatory Visit: Payer: Self-pay

## 2020-06-15 ENCOUNTER — Telehealth: Payer: Self-pay | Admitting: *Deleted

## 2020-06-15 DIAGNOSIS — R05 Cough: Secondary | ICD-10-CM | POA: Diagnosis present

## 2020-06-15 DIAGNOSIS — R058 Other specified cough: Secondary | ICD-10-CM

## 2020-06-15 DIAGNOSIS — T464X5A Adverse effect of angiotensin-converting-enzyme inhibitors, initial encounter: Secondary | ICD-10-CM

## 2020-06-15 MED ORDER — LOSARTAN POTASSIUM 25 MG PO TABS
25.0000 mg | ORAL_TABLET | Freq: Every day | ORAL | 3 refills | Status: DC
Start: 2020-06-15 — End: 2020-12-21

## 2020-06-15 NOTE — Telephone Encounter (Signed)
LM with family member to have pt call me back - Lelan Pons pV

## 2020-06-15 NOTE — Telephone Encounter (Signed)
I looked at the [facility] note from 01/2020 and yes cannot tell when last seizure was - if she has had a seizure within past 3 mos needs to be at hospital - can still be direct  If you could call her and find out I think that will help  thanks

## 2020-06-15 NOTE — Patient Instructions (Addendum)
It was a pleasure to meet you today. I believe your cough is most likely caused by the lisinopril. I want to switch you to a medication called losartan which is a similar medication but in a slightly different family. I want you to wait until your cough has completely resolved before you start taking the losartan. I assume this will be at most a week. I want you to schedule a follow-up visit for a lab check in 3 weeks so that we can check your kidney function at that time. You can continue to take your over-the-counter cough medications to help relieve the sore throat and hopefully decrease the amount of coughing. If you have any questions or concerns please feel free to call the clinic.. I hope you have a wonderful afternoon!

## 2020-06-15 NOTE — Progress Notes (Signed)
    SUBJECTIVE:   CHIEF COMPLAINT / HPI:   Cough Patient reports that she started lisinopril and she noticed that the cough started.  It has progressively worsened.  Patient acknowledges posttussive emesis.  Denies any fever, chills, sick contacts.  Is vaccinated for COVID-19.  No other concerns at this time   OBJECTIVE:   BP (!) 152/90   Pulse 74   Ht _0  (1.626 m)   SpO2 96%   BMI 27.81 kg/m   General: Well-appearing, no acute distress, sitting comfortably on exam table HEENT: Patient has mild amount of erythema at posterior oropharynx, no rhinorrhea noted, submandibular lymphadenopathy noted Respiratory: Lungs are clear to auscultation bilaterally, normal work of breathing, occasional cough Cardiac: Regular rate and rhythm, no murmurs appreciated Abdomen: Soft, nontender, positive bowel sounds  ASSESSMENT/PLAN:   Cough due to ACE inhibitor Patient reports she started noticing a cough after she started lisinopril.  The cough progressively got worse and worse until she started having posttussive emesis.  Denies any fever, chills, other sick symptoms including congestion, rhinorrhea.  Patient reports she discontinued the lisinopril and her cough is slowly gotten better. -Discontinued lisinopril on patient's med list -Started losartan 25 mg daily -Patient will follow up in 3 weeks for be met     Gifford Shave, MD [place]

## 2020-06-15 NOTE — Telephone Encounter (Signed)
Dr Carlean Purl,  This pt has a hx of seizures- its unclear last seizure- not tonic clonic in nature- she is on Keppra -  Is it ok to proceed as scheduled for a recall overdue colon 07-13-2020?  Lelan Pons [facility]

## 2020-06-15 NOTE — Telephone Encounter (Signed)
Attempted to call pt- no answer, no answering machine- will try later today

## 2020-06-16 DIAGNOSIS — R058 Other specified cough: Secondary | ICD-10-CM

## 2020-06-16 DIAGNOSIS — T464X5A Adverse effect of angiotensin-converting-enzyme inhibitors, initial encounter: Secondary | ICD-10-CM | POA: Insufficient documentation

## 2020-06-16 HISTORY — DX: Other specified cough: R05.8

## 2020-06-16 HISTORY — DX: Adverse effect of angiotensin-converting-enzyme inhibitors, initial encounter: T46.4X5A

## 2020-06-16 NOTE — Assessment & Plan Note (Signed)
Patient reports she started noticing a cough after she started lisinopril.  The cough progressively got worse and worse until she started having posttussive emesis.  Denies any fever, chills, other sick symptoms including congestion, rhinorrhea.  Patient reports she discontinued the lisinopril and her cough is slowly gotten better. -Discontinued lisinopril on patient's med list -Started losartan 25 mg daily -Patient will follow up in 3 weeks for be met

## 2020-06-17 NOTE — Telephone Encounter (Signed)
Per Family Member, Pt is on vacation and he will have her call when she is back in town   [place] [facility]

## 2020-06-21 ENCOUNTER — Other Ambulatory Visit: Payer: Self-pay

## 2020-06-21 DIAGNOSIS — Z86010 Personal history of colonic polyps: Secondary | ICD-10-CM

## 2020-06-21 NOTE — Telephone Encounter (Signed)
I spoke with this pt and she states she has had a seizure within the past three months.  Sheri or Patty, Could you set her up at the hospital for me please?  Thanks, [employer]

## 2020-06-21 NOTE — Telephone Encounter (Signed)
Dr. Carlean Purl,  I called pt back to let her know about her about her procedure being done at the hospital.  This time, I reached her husband.  He states that she has not had any seizure activity in 6 years; she has short term memory and he states she is easily confused about her medical hx.  He states she takes her Keppra every day and follows up with the neurologist at [facility].  He was very clear that she has not had any recent seizure activity.  After clarifying this, is she ok to proceed at [facility]?   Thanks, [employer]

## 2020-06-21 NOTE — Telephone Encounter (Signed)
She has been scheduled for 08/08/20 7:30.  She will need to arrive at 6:00 am.  COVID screen at [facility] location for 08/04/20 9:30

## 2020-06-22 NOTE — Telephone Encounter (Signed)
[facility] is ok based upon this hx

## 2020-06-23 NOTE — Telephone Encounter (Signed)
At Pv, we need to verify last seizure with pt and husband and cancel procdurfe not neded- if in the last 3 months, needs hospital procedure - if 6 yrs like husband saod [facility] ok per Dr Carlean Purl

## 2020-06-29 ENCOUNTER — Ambulatory Visit (AMBULATORY_SURGERY_CENTER): Payer: Self-pay | Admitting: *Deleted

## 2020-06-29 ENCOUNTER — Other Ambulatory Visit: Payer: Self-pay

## 2020-06-29 VITALS — Ht 64.0 in | Wt 164.0 lb

## 2020-06-29 DIAGNOSIS — Z86010 Personal history of colonic polyps: Secondary | ICD-10-CM

## 2020-06-29 NOTE — Progress Notes (Signed)
No egg or soy allergy known to patient  No issues with past sedation with any surgeries or procedures no intubation problems in the past  No diet pills per patient No home 02 use per patient  No blood thinners per patient  Pt denies issues with constipation  No A fib or A flutter  EMMI video to pt or MyChart  COVID 19 guidelines implemented in [facility] today   Last seizure , true seizure 3 yrs ago per pt's husband -  she has panic attacks 5-6 x a day but not seizures - she was instructed to take her seizure meds by 530 am 8-18 WED - pt and husband verbalized understanding     Due to the COVID-19 pandemic we are asking patients to follow these guidelines. Please only bring one care partner. Please be aware that your care partner may wait in the car in the parking lot or if they feel like they will be too hot to wait in the car, they may wait in the lobby on the 4th floor. All care partners are required to wear a mask the entire time (we do not have any that we can provide them), they need to practice social distancing, and we will do a Covid check for all patient's and care partners when you arrive. Also we will check their temperature and your temperature. If the care partner waits in their car they need to stay in the parking lot the entire time and we will call them on their cell phone when the patient is ready for discharge so they can bring the car to the front of the building. Also all patient's will need to wear a mask into building.

## 2020-06-30 ENCOUNTER — Other Ambulatory Visit: Payer: Self-pay | Admitting: Family Medicine

## 2020-06-30 DIAGNOSIS — F411 Generalized anxiety disorder: Secondary | ICD-10-CM

## 2020-07-06 ENCOUNTER — Other Ambulatory Visit: Payer: Medicare Other

## 2020-07-06 NOTE — Progress Notes (Deleted)
    SUBJECTIVE:   CHIEF COMPLAINT / HPI:   T2DM Last A1c on 6/7 was 6.4 Currently on Metformin 500 mg twice daily She was started on lisinopril 5 mg daily for renal protection on 6/7 CBG*** Eye exam***  Hypertension Currently on hydrochlorothiazide 25 mg daily and lisinopril 5 mg that was started on 6/7 Reports compliance*** Denies chest pain, shortness of breath***  Anxiety state Patient is on chronic Klonopin which seems to help her symptoms ***  PERTINENT  PMH / PSH: ***  OBJECTIVE:   There were no vitals taken for this visit.  ***  ASSESSMENT/PLAN:   No problem-specific Assessment & Plan notes found for this encounter.     [name], [name]

## 2020-07-11 ENCOUNTER — Ambulatory Visit: Payer: Medicare Other | Admitting: Family Medicine

## 2020-07-13 ENCOUNTER — Encounter: Payer: Self-pay | Admitting: Internal Medicine

## 2020-07-13 ENCOUNTER — Ambulatory Visit (AMBULATORY_SURGERY_CENTER): Payer: Medicare Other | Admitting: Internal Medicine

## 2020-07-13 ENCOUNTER — Other Ambulatory Visit: Payer: Self-pay

## 2020-07-13 VITALS — BP 115/64 | HR 61 | Temp 97.1°F | Resp 12 | Ht 64.0 in | Wt 164.0 lb

## 2020-07-13 DIAGNOSIS — D125 Benign neoplasm of sigmoid colon: Secondary | ICD-10-CM | POA: Diagnosis not present

## 2020-07-13 DIAGNOSIS — D123 Benign neoplasm of transverse colon: Secondary | ICD-10-CM

## 2020-07-13 DIAGNOSIS — D124 Benign neoplasm of descending colon: Secondary | ICD-10-CM

## 2020-07-13 DIAGNOSIS — Z86010 Personal history of colonic polyps: Secondary | ICD-10-CM

## 2020-07-13 MED ORDER — SODIUM CHLORIDE 0.9 % IV SOLN: 500.0000 mL | Freq: Once | INTRAVENOUS | Status: DC

## 2020-07-13 NOTE — Patient Instructions (Addendum)
I found and removed 8 polyps. One tiny polyp was not picked up - I am certain it was benign.  I will let you know pathology results and when to have another routine colonoscopy by mail and/or My Chart.  I appreciate the opportunity to care for you. Gatha Mayer, MD, [facility]  Handouts given polyps: Resume previous diet Continue current medications Repeat colonoscopy  DO NOT TAKE ASPIRIN, IBUPROFEN, NAPROXEN OR MOTRIN FOR 2 WEEKS AFTER TODAY YOU HAD AN ENDOSCOPIC PROCEDURE TODAY AT [place]:   Refer to the procedure report that was given to you for any specific questions about what was found during the examination.  If the procedure report does not answer your questions, please call your gastroenterologist to clarify.  If you requested that your care partner not be given the details of your procedure findings, then the procedure report has been included in a sealed envelope for you to review at your convenience later.  YOU SHOULD EXPECT: Some feelings of bloating in the abdomen. Passage of more gas than usual.  Walking can help get rid of the air that was put into your GI tract during the procedure and reduce the bloating. If you had a lower endoscopy (such as a colonoscopy or flexible sigmoidoscopy) you may notice spotting of blood in your stool or on the toilet paper. If you underwent a bowel prep for your procedure, you may not have a normal bowel movement for a few days.  Please Note:  You might notice some irritation and congestion in your nose or some drainage.  This is from the oxygen used during your procedure.  There is no need for concern and it should clear up in a day or so.  SYMPTOMS TO REPORT IMMEDIATELY:   Following lower endoscopy (colonoscopy or flexible sigmoidoscopy):  Excessive amounts of blood in the stool  Significant tenderness or worsening of abdominal pains  Swelling of the abdomen that is new, acute  Fever of 100F or higher   For urgent or  emergent issues, a gastroenterologist can be reached at any hour by calling [phone]. Do not use MyChart messaging for urgent concerns.    DIET:  We do recommend a small meal at first, but then you may proceed to your regular diet.  Drink plenty of fluids but you should avoid alcoholic beverages for 24 hours.  ACTIVITY:  You should plan to take it easy for the rest of today and you should NOT DRIVE or use heavy machinery until tomorrow (because of the sedation medicines used during the test).    FOLLOW UP: Our staff will call the number listed on your records 48-72 hours following your procedure to check on you and address any questions or concerns that you may have regarding the information given to you following your procedure. If we do not reach you, we will leave a message.  We will attempt to reach you two times.  During this call, we will ask if you have developed any symptoms of COVID 19. If you develop any symptoms (ie: fever, flu-like symptoms, shortness of breath, cough etc.) before then, please call [phone].  If you test positive for Covid 19 in the 2 weeks post procedure, please call and report this information to [place].    If any biopsies were taken you will be contacted by phone or by letter within the next 1-3 weeks.  Please call us at [phone] if you have not heard about the biopsies in  3 weeks.    SIGNATURES/CONFIDENTIALITY: You and/or your care partner have signed paperwork which will be entered into your electronic medical record.  These signatures attest to the fact that that the information above on your After Visit Summary has been reviewed and is understood.  Full responsibility of the confidentiality of this discharge information lies with you and/or your care-partner.

## 2020-07-13 NOTE — Op Note (Signed)
[place] Patient Name: [name] Procedure Date: 07/13/2020 8:33 AM MRN: [id number] Endoscopist: Gatha Mayer , MD Age: 69 Referring MD:  Date of Birth: Aug 24, 1951 Gender: Female Account #: [id number] Procedure:                Colonoscopy Indications:              Surveillance: Personal history of adenomatous                            polyps on last colonoscopy > 5 years ago Medicines:                Propofol per Anesthesia, Monitored Anesthesia Care Procedure:                Pre-Anesthesia Assessment:                           - Prior to the procedure, a History and Physical                            was performed, and patient medications and                            allergies were reviewed. The patient's tolerance of                            previous anesthesia was also reviewed. The risks                            and benefits of the procedure and the sedation                            options and risks were discussed with the patient.                            All questions were answered, and informed consent                            was obtained. Prior Anticoagulants: The patient has                            taken no previous anticoagulant or antiplatelet                            agents. ASA Grade Assessment: II - A patient with                            mild systemic disease. After reviewing the risks                            and benefits, the patient was deemed in                            satisfactory condition to undergo the procedure.  After obtaining informed consent, the colonoscope                            was passed under direct vision. Throughout the                            procedure, the patient's blood pressure, pulse, and                            oxygen saturations were monitored continuously. The                            Colonoscope was introduced through the anus and                             advanced to the the cecum, identified by                            appendiceal orifice and ileocecal valve. The                            colonoscopy was performed without difficulty. The                            patient tolerated the procedure well. The quality                            of the bowel preparation was good. The bowel                            preparation used was Miralax via split dose                            instruction. The bowel preparation used was Miralax                            via split dose instruction. Scope In: 8:56:57 AM Scope Out: 9:20:59 AM Scope Withdrawal Time: 0 hours 19 minutes 54 seconds  Total Procedure Duration: 0 hours 24 minutes 2 seconds  Findings:                 The perianal and digital rectal examinations were                            normal.                           A 10 mm polyp was found in the sigmoid colon. The                            polyp was pedunculated. The polyp was removed with                            a hot snare. Resection and retrieval were complete.  Verification of patient identification for the                            specimen was done. Estimated blood loss: none.                           Seven sessile polyps were found in the descending                            colon and transverse colon. The polyps were 3 to 6                            mm in size. These polyps were removed with a cold                            snare. Resection was complete, but the polyp tissue                            was only partially retrieved. Verification of                            patient identification for the specimen was done.                            Estimated blood loss was minimal.                           The exam was otherwise without abnormality on                            direct and retroflexion views. Complications:            No immediate complications. Estimated Blood Loss:      Estimated blood loss was minimal. Impression:               - One 10 mm polyp in the sigmoid colon, removed                            with a hot snare. Resected and retrieved.                           - Seven 3 to 6 mm polyps in the descending colon                            and in the transverse colon, removed with a cold                            snare. Complete resection. Partial retrieval.                           - The examination was otherwise normal on direct                            and  retroflexion views.                           - Personal history of colonic polyps. 4 adenomas                            max 15 mm 2016 Recommendation:           - Patient has a contact number available for                            emergencies. The signs and symptoms of potential                            delayed complications were discussed with the                            patient. Return to normal activities tomorrow.                            Written discharge instructions were provided to the                            patient.                           - Resume previous diet.                           - Continue present medications.                           - Repeat colonoscopy is recommended for                            surveillance. The colonoscopy date will be                            determined after pathology results from today's                            exam become available for review.                           - No aspirin, ibuprofen, naproxen, or other                            non-steroidal anti-inflammatory drugs for 2 weeks                            after polyp removal. Gatha Mayer, MD 07/13/2020 9:29:56 AM This report has been signed electronically.

## 2020-07-13 NOTE — Progress Notes (Signed)
PT taken to PACU. Monitors in place. VSS. Report given to RN. 

## 2020-07-13 NOTE — Progress Notes (Addendum)
Called to room to assist during endoscopic procedure.  Patient ID and intended procedure confirmed with present staff. Received instructions for my participation in the procedure from the performing physician.  4 transverse colon polyps removed, but only retrieved 3.  Dr. Carlean Purl was aware. maw

## 2020-07-13 NOTE — Progress Notes (Signed)
VS-CW  Pt's states no medical or surgical changes since previsit or office visit.  

## 2020-07-15 ENCOUNTER — Telehealth: Payer: Self-pay

## 2020-07-15 NOTE — Telephone Encounter (Signed)
  Follow up Call-  Call back number 07/13/2020  Post procedure Call Back phone  # [phone]  Permission to leave phone message Yes  Some recent data might be hidden     Patient questions:  Do you have a fever, pain , or abdominal swelling? No. Pain Score  0 *  Have you tolerated food without any problems? Yes.    Have you been able to return to your normal activities? Yes.    Do you have any questions about your discharge instructions: Diet   No. Medications  No. Follow up visit  No.  Do you have questions or concerns about your Care? No.  Actions: * If pain score is 4 or above: No action needed, pain <4.  1.   Have you developed a fever since your procedure? No 2.   Have you had an respiratory symptoms (SOB or cough) since your procedure? No  3.   Have you tested positive for COVID 19 since your procedure No 4.   Have you had any family members/close contacts diagnosed with the COVID 19 since your procedure?  No   If yes to any of these questions please route to Joylene John, RN and Joella Prince, RN

## 2020-07-19 ENCOUNTER — Encounter: Payer: Self-pay | Admitting: Internal Medicine

## 2020-07-20 ENCOUNTER — Other Ambulatory Visit: Payer: Self-pay

## 2020-07-20 ENCOUNTER — Other Ambulatory Visit: Payer: Medicare Other

## 2020-07-20 DIAGNOSIS — I10 Essential (primary) hypertension: Secondary | ICD-10-CM

## 2020-07-20 DIAGNOSIS — E119 Type 2 diabetes mellitus without complications: Secondary | ICD-10-CM

## 2020-07-21 ENCOUNTER — Other Ambulatory Visit: Payer: Self-pay | Admitting: Family Medicine

## 2020-07-21 DIAGNOSIS — E876 Hypokalemia: Secondary | ICD-10-CM

## 2020-07-21 LAB — BASIC METABOLIC PANEL
BUN/Creatinine Ratio: 13 (ref 12–28)
BUN: 10 mg/dL (ref 8–27)
CO2: 29 mmol/L (ref 20–29)
Calcium: 9.1 mg/dL (ref 8.7–10.3)
Chloride: 98 mmol/L (ref 96–106)
Creatinine, Ser: 0.78 mg/dL (ref 0.57–1.00)
GFR calc Af Amer: 90 mL/min/{1.73_m2} (ref >59)
GFR calc non Af Amer: 78 mL/min/{1.73_m2} (ref >59)
Glucose: 119 mg/dL — ABNORMAL HIGH (ref 65–99)
Potassium: 3 mmol/L — ABNORMAL LOW (ref 3.5–5.2)
Sodium: 142 mmol/L (ref 134–144)

## 2020-07-21 MED ORDER — POTASSIUM CHLORIDE CRYS ER 20 MEQ PO TBCR: 20.0000 meq | EXTENDED_RELEASE_TABLET | Freq: Every day | ORAL | 0 refills | Status: DC

## 2020-07-22 ENCOUNTER — Telehealth: Payer: Self-pay

## 2020-07-22 ENCOUNTER — Encounter: Payer: Self-pay | Admitting: Family Medicine

## 2020-07-22 NOTE — Telephone Encounter (Signed)
Letter created.  Will leave at front desk.  Please let husband know.  Thanks!

## 2020-07-22 NOTE — Progress Notes (Signed)
Letter for jury duty

## 2020-07-22 NOTE — Telephone Encounter (Signed)
Patients husband LVM on nurse line requesting a jury duty excuse note for patient. Patients husband states she is not "mentally or physically" capable of serving on a jury. Her summons is 08/20/2020, he would like letter well before so he can mail. Please advise.

## 2020-07-25 NOTE — Telephone Encounter (Signed)
Husband contacted and informed of letter ready for pick up.

## 2020-07-27 ENCOUNTER — Other Ambulatory Visit: Payer: Self-pay | Admitting: Family Medicine

## 2020-07-27 ENCOUNTER — Telehealth: Payer: Self-pay | Admitting: Internal Medicine

## 2020-07-27 DIAGNOSIS — F411 Generalized anxiety disorder: Secondary | ICD-10-CM

## 2020-07-27 NOTE — Telephone Encounter (Signed)
Patient had been scheduled  With hospital due to a hx of seizure.  Was determined at her pre-visit no seizures in 6 years.  Case was performed at [facility] in August.  Case cancelled with [facility] and her husband notified of the oversight.

## 2020-07-27 NOTE — Progress Notes (Signed)
I called Pt for pre-procedure phone call. Her husband said that she had her colonoscopy already and was unaware of a procedure on 08/08/20. I told them to get in contact with Dr. Celesta Aver office

## 2020-07-28 NOTE — Telephone Encounter (Signed)
Needs appointment before another refill.

## 2020-08-02 ENCOUNTER — Other Ambulatory Visit: Payer: Self-pay | Admitting: Family Medicine

## 2020-08-02 DIAGNOSIS — I10 Essential (primary) hypertension: Secondary | ICD-10-CM

## 2020-08-02 NOTE — Telephone Encounter (Signed)
Called patient and scheduled follow up appointment for 9/22 at 0950.   Talbot Grumbling, RN

## 2020-08-04 ENCOUNTER — Other Ambulatory Visit (HOSPITAL_COMMUNITY): Payer: Medicare Other

## 2020-08-05 ENCOUNTER — Ambulatory Visit (INDEPENDENT_AMBULATORY_CARE_PROVIDER_SITE_OTHER): Payer: Medicare Other | Admitting: Family Medicine

## 2020-08-05 ENCOUNTER — Other Ambulatory Visit: Payer: Self-pay

## 2020-08-05 DIAGNOSIS — R21 Rash and other nonspecific skin eruption: Secondary | ICD-10-CM | POA: Diagnosis present

## 2020-08-05 MED ORDER — PREDNISONE 20 MG PO TABS: 20.0000 mg | ORAL_TABLET | Freq: Every day | ORAL | 0 refills | Status: DC

## 2020-08-05 NOTE — Progress Notes (Signed)
    SUBJECTIVE:   CHIEF COMPLAINT / HPI:   Patient is here for a rash on her face, neck, arm and in between one of her fingers on her hand. She noticed the rash first occurred about 4 days ago. She is unsure if the rash comes and goes and is unsure of the progression. She states the rash itches and is painful. She has tried cold compresses, and triamcinolone ointment that she uses for her eczema without much relief. She reports not having this particular rash before. She denies any changes in her soaps, lotions, detergents, or other body products, and denies any changes in medication. She does not recall eating anything she is allergic to. Denies fever or recent infection.  Patient also endorsed COVID like symptoms of a cough, diarrhea, and loss of taste when checking in at the front desk, so the visit was conducted outside. During the visit patient reported she no longer has a cough and believes it was due to a medication she was previously taking. Denies any sick contacts. Reports the loss of taste occurring around the same time her rash appeared which was about 4 days ago.   PERTINENT  PMH / PSH:  Pompholyx eczema  OBJECTIVE:   There were no vitals taken for this visit.   Physical Exam Constitutional:      General: She is not in acute distress.    Appearance: She is not ill-appearing.  Pulmonary:     Effort: Pulmonary effort is normal.  Skin:    General: Skin is warm and dry.     Findings: Erythema and rash present.     Comments: Erythematous pruritic patches over face, neck, arm and hand (see image below)           ASSESSMENT/PLAN:   No problem-specific Assessment & Plan notes found for this encounter.   Rash Unsure of exact etiology of patient's rash. It could potentially be allergic dermatitis vs contact dermatitis, or eczema. The appearance of rash as shown above does not look like the raised type of rash with papules or plaques that we would see with urticaria. Patient  does have a history of eczema but it has not appeared like this. Something she ate or was exposed to could potentially have caused this.   - Prescribed prednisone 20mg  to be taken daily for 1 week. - Patient to follow up if rash worsens and does not improve in the next 1-2 weeks  Concern for COVID-19 Patient reported having a cough, diarrhea, and loss of taste when she checked in at the front desk, so visit was conducted outside. No known COVID exposure. Patient is vaccinated. She states she was tested but was unsure when. Recommended that patient get COVID tested   [place]

## 2020-08-05 NOTE — Patient Instructions (Addendum)
It was great seeing you today!  Please check-out at the front desk before leaving the clinic. I'd like to see you back in 2 weeks if your rash does not improve, but if you need to be seen earlier than that for any new issues we're happy to fit you in, just give [place] a call!  Visit Remembers: - Stop by the pharmacy to pick up your prescription of prednisone. Please take this for 1 week to help with your rash - Please also get COVID tested     If you haven't already, sign up for My Chart to have easy access to your labs results, and communication with your primary care physician.  Feel free to call with any questions or concerns at any time, at [phone].   Take care,  Dr. Yehuda [facility]

## 2020-08-08 ENCOUNTER — Ambulatory Visit (HOSPITAL_COMMUNITY): Admit: 2020-08-08 | Payer: Medicare Other | Admitting: Internal Medicine

## 2020-08-08 ENCOUNTER — Encounter (HOSPITAL_COMMUNITY): Payer: Self-pay

## 2020-08-08 SURGERY — COLONOSCOPY WITH PROPOFOL
Anesthesia: Monitor Anesthesia Care

## 2020-08-12 ENCOUNTER — Other Ambulatory Visit: Payer: Self-pay | Admitting: Family Medicine

## 2020-08-12 DIAGNOSIS — L301 Dyshidrosis [pompholyx]: Secondary | ICD-10-CM

## 2020-08-17 ENCOUNTER — Telehealth: Payer: Self-pay | Admitting: Family Medicine

## 2020-08-17 ENCOUNTER — Ambulatory Visit: Payer: Medicare Other | Admitting: Family Medicine

## 2020-08-17 NOTE — Telephone Encounter (Signed)
Patients appointment has been rescheduled due to pcp being out of office. The appointment was for medication refills. Could patient have enough medication to last until the next appointment?

## 2020-08-18 NOTE — Telephone Encounter (Signed)
Unclear what needs refilled, but if it is Klonopin, she should be okay now and would need refill around 10/2.  They should contact office when refill is needed.

## 2020-08-25 ENCOUNTER — Other Ambulatory Visit: Payer: Self-pay | Admitting: Family Medicine

## 2020-08-25 DIAGNOSIS — E119 Type 2 diabetes mellitus without complications: Secondary | ICD-10-CM

## 2020-08-25 DIAGNOSIS — F411 Generalized anxiety disorder: Secondary | ICD-10-CM

## 2020-08-25 NOTE — Telephone Encounter (Signed)
PMP reviewed, no red flags.  Will only give partial refill until patient's appt as had to be rescheduled by office.

## 2020-08-30 NOTE — Telephone Encounter (Signed)
Spoke to pt and pt's husband. Pt has enough meds to get her to her appt with Dr. Sandi Carne on 10/13. Ottis Stain, CMA

## 2020-09-04 ENCOUNTER — Other Ambulatory Visit: Payer: Self-pay | Admitting: Family Medicine

## 2020-09-04 DIAGNOSIS — F411 Generalized anxiety disorder: Secondary | ICD-10-CM

## 2020-09-07 ENCOUNTER — Ambulatory Visit (INDEPENDENT_AMBULATORY_CARE_PROVIDER_SITE_OTHER): Payer: Medicare Other | Admitting: Family Medicine

## 2020-09-07 DIAGNOSIS — G479 Sleep disorder, unspecified: Secondary | ICD-10-CM

## 2020-09-07 NOTE — Progress Notes (Signed)
Error, no show

## 2020-09-09 ENCOUNTER — Other Ambulatory Visit: Payer: Self-pay | Admitting: Family Medicine

## 2020-09-09 DIAGNOSIS — F411 Generalized anxiety disorder: Secondary | ICD-10-CM

## 2020-09-09 NOTE — Telephone Encounter (Signed)
Will refill for another 10 days, but patient no showed appointment and will need to be seen before another refill can be provided.  She can see any provider but needs to be seen for another Rx.  Please call her and let her know of above and assist with scheduling.

## 2020-09-12 ENCOUNTER — Other Ambulatory Visit: Payer: Self-pay | Admitting: Family Medicine

## 2020-09-12 DIAGNOSIS — F411 Generalized anxiety disorder: Secondary | ICD-10-CM

## 2020-09-12 MED ORDER — CLONAZEPAM 0.5 MG PO TABS: 0.5000 mg | ORAL_TABLET | Freq: Two times a day (BID) | ORAL | 0 refills | Status: DC | PRN

## 2020-09-12 NOTE — Telephone Encounter (Signed)
Due to system wide prescribing issues on 10/15, please resend prescription. I confirmed with pharmacy this medication was never received.

## 2020-09-12 NOTE — Addendum Note (Signed)
Addended by: Dorna Bloom on: 09/12/2020 11:08 AM   Modules accepted: Orders

## 2020-09-12 NOTE — Telephone Encounter (Signed)
Resent Rx 

## 2020-09-12 NOTE — Progress Notes (Signed)
Previous refill not sent due to prescribing system outage.  Patient scheduled for appointment.

## 2020-09-21 ENCOUNTER — Encounter: Payer: Self-pay | Admitting: Family Medicine

## 2020-09-21 ENCOUNTER — Ambulatory Visit (INDEPENDENT_AMBULATORY_CARE_PROVIDER_SITE_OTHER): Payer: Medicare Other | Admitting: Family Medicine

## 2020-09-21 ENCOUNTER — Other Ambulatory Visit: Payer: Self-pay

## 2020-09-21 VITALS — BP 140/82 | HR 61 | Ht 64.0 in | Wt 162.0 lb

## 2020-09-21 DIAGNOSIS — F411 Generalized anxiety disorder: Secondary | ICD-10-CM

## 2020-09-21 DIAGNOSIS — L301 Dyshidrosis [pompholyx]: Secondary | ICD-10-CM

## 2020-09-21 DIAGNOSIS — E119 Type 2 diabetes mellitus without complications: Secondary | ICD-10-CM

## 2020-09-21 DIAGNOSIS — E876 Hypokalemia: Secondary | ICD-10-CM

## 2020-09-21 DIAGNOSIS — Z23 Encounter for immunization: Secondary | ICD-10-CM | POA: Diagnosis not present

## 2020-09-21 DIAGNOSIS — E785 Hyperlipidemia, unspecified: Secondary | ICD-10-CM | POA: Diagnosis not present

## 2020-09-21 MED ORDER — CLONAZEPAM 0.5 MG PO TABS: 0.5000 mg | ORAL_TABLET | Freq: Two times a day (BID) | ORAL | 0 refills | Status: DC | PRN

## 2020-09-21 MED ORDER — TRIAMCINOLONE ACETONIDE 0.1 % EX CREA: TOPICAL_CREAM | CUTANEOUS | 0 refills | Status: DC

## 2020-09-21 NOTE — Progress Notes (Signed)
    SUBJECTIVE:   CHIEF COMPLAINT / HPI:   T2DM Current regimen: Metformin 500 mg twice daily Last A1c on 6/7 was 6.4 Had been started on lisinopril that patient had cough, was changed to losartan 25 mg daily CBG does not check Eye exam will be in April No hypoglycemic symptoms  HLD Last lipid panel 05/02/2020, LDL 83 at that time Currently on Lipitor 40 mg daily  Denies chest pain, SOB  Anxiety Has a history of traumatic brain injury and poorly controlled anxiety Current regimen: Klonopin 0.5mg , takes two tablets daily, hydroxyzine 25 mg twice daily Does well with these medications, occasionally has bad days PHQ-9 score is elevated today, patient reports that her son recently passed and today is his birthday, but she feels that she is coping as well as she could Declines any further assistance  Hypokalemia Last BMP on 8/25 with hypokalemia to 3.0, patient was advised to take potassium, did not come back for repeat labs Patient's husband reports that she did take potassium  Eczema Patient reports occasional rashes for which she uses triamcinolone cream with good relief Is requesting a refill  PERTINENT  PMH / PSH: Anxiety, seizures, T2DM, HTN, HLD, hypothyroidism  OBJECTIVE:   BP 140/82   Pulse 61   Ht 5\' 4"  (1.626 m)   Wt 162 lb (73.5 kg)   SpO2 95%   BMI 27.81 kg/m    Physical Exam:  General: 69 y.o. female in NAD Cardio: RRR no m/r/g Lungs: CTAB, no wheezing, no rhonchi, no crackles, no IWOB on RA Skin: warm and dry Extremities: No edema Psych: Mood and affect appropriate for circumstance, patient does occasionally forget what has been talked about does not remember meeting this provider previously, denies SI    ASSESSMENT/PLAN:   Diabetes mellitus No need to recheck A1c at this time given that it is so well controlled.  We will continue with Metformin 500 mg twice daily.  Will obtain a BMP today for her hypokalemia.  Continue on statin and  losartan.  Hyperlipidemia We will recheck LDL today to see if she has had appropriate decrease, her goal of LDL is less than 70.  She is currently on Lipitor 40 mg daily.  We will continue with this for now.  Could add increase Lipitor if she is still not at goal.  Anxiety state Have had previous discussions with patient and her husband in regards to Klonopin use, but they insist that it is important for her to remain on this medication.  She has tried SSRIs in the past without good success and states that she did not like the way it made her feel.  Her anxiety is likely affected by her history of traumatic brain injury as well.  Today, she is struggling with the recent death of her son, but overall states that she is doing okay and denies SI.  Okay to refill her Klonopin for 3 months.  We will have her follow-up in 3 months.  Hypokalemia Did take potassium supplementation.  Will recheck BMP today to ensure that this is resolved.  Pompholyx eczema Triamcinolone refilled for patient.  Should not use for more than 2 weeks at a time.     Cleophas Dunker, [place]

## 2020-09-21 NOTE — Patient Instructions (Signed)
Thank you for coming to see me today. It was a pleasure. Today we talked about:   We will get some labs today.  If they are abnormal or we need to do something about them, I will call you.  If they are normal, I will send you a message on MyChart (if it is active) or a letter in the mail.  If you don't hear from [place] in 2 weeks, please call the office at the number below.  Let us know when you need refills.  We are okay to refill your Klonopin for 3 months, but then you will need a visit.  Please follow-up with me in 3 months.  If you have any questions or concerns, please do not hesitate to call the office at [phone].  Best,   [place] Constable, DO

## 2020-09-21 NOTE — Assessment & Plan Note (Addendum)
We will recheck LDL today to see if she has had appropriate decrease, her goal of LDL is less than 70.  She is currently on Lipitor 40 mg daily.  We will continue with this for now.  Could add increase Lipitor if she is still not at goal.

## 2020-09-21 NOTE — Assessment & Plan Note (Signed)
No need to recheck A1c at this time given that it is so well controlled.  We will continue with Metformin 500 mg twice daily.  Will obtain a BMP today for her hypokalemia.  Continue on statin and losartan.

## 2020-09-21 NOTE — Assessment & Plan Note (Addendum)
Have had previous discussions with patient and her husband in regards to Klonopin use, but they insist that it is important for her to remain on this medication.  She has tried SSRIs in the past without good success and states that she did not like the way it made her feel.  Her anxiety is likely affected by her history of traumatic brain injury as well.  Today, she is struggling with the recent death of her son, but overall states that she is doing okay and denies SI.  Okay to refill her Klonopin for 3 months.  We will have her follow-up in 3 months.

## 2020-09-21 NOTE — Assessment & Plan Note (Signed)
Triamcinolone refilled for patient.  Should not use for more than 2 weeks at a time.

## 2020-09-21 NOTE — Assessment & Plan Note (Signed)
Did take potassium supplementation.  Will recheck BMP today to ensure that this is resolved.

## 2020-09-22 ENCOUNTER — Other Ambulatory Visit: Payer: Self-pay | Admitting: Family Medicine

## 2020-09-22 DIAGNOSIS — E876 Hypokalemia: Secondary | ICD-10-CM

## 2020-09-22 LAB — BASIC METABOLIC PANEL
BUN/Creatinine Ratio: 9 — ABNORMAL LOW (ref 12–28)
BUN: 7 mg/dL — ABNORMAL LOW (ref 8–27)
CO2: 28 mmol/L (ref 20–29)
Calcium: 9.4 mg/dL (ref 8.7–10.3)
Chloride: 96 mmol/L (ref 96–106)
Creatinine, Ser: 0.81 mg/dL (ref 0.57–1.00)
GFR calc Af Amer: 86 mL/min/{1.73_m2} (ref >59)
GFR calc non Af Amer: 74 mL/min/{1.73_m2} (ref >59)
Glucose: 133 mg/dL — ABNORMAL HIGH (ref 65–99)
Potassium: 3 mmol/L — ABNORMAL LOW (ref 3.5–5.2)
Sodium: 140 mmol/L (ref 134–144)

## 2020-09-22 LAB — LDL CHOLESTEROL, DIRECT: LDL Direct: 90 mg/dL (ref 0–99)

## 2020-09-22 MED ORDER — POTASSIUM CHLORIDE ER 20 MEQ PO TBCR: 20.0000 meq | EXTENDED_RELEASE_TABLET | Freq: Every day | ORAL | 0 refills | Status: DC

## 2020-09-27 ENCOUNTER — Other Ambulatory Visit: Payer: Medicare Other

## 2020-09-27 ENCOUNTER — Other Ambulatory Visit: Payer: Self-pay

## 2020-09-27 DIAGNOSIS — E876 Hypokalemia: Secondary | ICD-10-CM

## 2020-09-28 LAB — BASIC METABOLIC PANEL
BUN/Creatinine Ratio: 13 (ref 12–28)
BUN: 10 mg/dL (ref 8–27)
CO2: 29 mmol/L (ref 20–29)
Calcium: 9.6 mg/dL (ref 8.7–10.3)
Chloride: 100 mmol/L (ref 96–106)
Creatinine, Ser: 0.76 mg/dL (ref 0.57–1.00)
GFR calc Af Amer: 93 mL/min/{1.73_m2} (ref >59)
GFR calc non Af Amer: 80 mL/min/{1.73_m2} (ref >59)
Glucose: 124 mg/dL — ABNORMAL HIGH (ref 65–99)
Potassium: 3.5 mmol/L (ref 3.5–5.2)
Sodium: 143 mmol/L (ref 134–144)

## 2020-10-21 ENCOUNTER — Other Ambulatory Visit: Payer: Self-pay | Admitting: Family Medicine

## 2020-10-21 DIAGNOSIS — F411 Generalized anxiety disorder: Secondary | ICD-10-CM

## 2020-11-14 ENCOUNTER — Other Ambulatory Visit: Payer: Self-pay | Admitting: Family Medicine

## 2020-11-14 DIAGNOSIS — E785 Hyperlipidemia, unspecified: Secondary | ICD-10-CM

## 2020-11-14 DIAGNOSIS — I10 Essential (primary) hypertension: Secondary | ICD-10-CM

## 2020-11-20 ENCOUNTER — Other Ambulatory Visit: Payer: Self-pay | Admitting: Family Medicine

## 2020-11-20 DIAGNOSIS — F411 Generalized anxiety disorder: Secondary | ICD-10-CM

## 2020-11-21 NOTE — Telephone Encounter (Signed)
PMP reviewed, no red flags. 

## 2020-12-06 ENCOUNTER — Other Ambulatory Visit: Payer: Self-pay | Admitting: Family Medicine

## 2020-12-06 DIAGNOSIS — E119 Type 2 diabetes mellitus without complications: Secondary | ICD-10-CM

## 2020-12-06 DIAGNOSIS — G479 Sleep disorder, unspecified: Secondary | ICD-10-CM

## 2020-12-20 NOTE — Progress Notes (Signed)
SUBJECTIVE:   CHIEF COMPLAINT / HPI:   Anxiety follow-up History of traumatic brain injury and poorly controlled anxiety Current regimen: Klonopin 0.5 mg 2 tablets daily, hydroxyzine 25 mg daily, celexa 20 mg as needed but taking daily over the last month (which is prescribed) At last visit on 10/27, patient had noted that she had lost her son recently, daughter states this was in 2019 but was around his birthday Daughter is present with patient today and states that she has become more hostile when she has mood swings and she is worried that she is not coping well  Daughter calls husband during encounter to find out about medications, but reports that he doesn't always know Has a neurologist that she saw a few months ago  Hypokalemia On check on 10/27, patient was hypokalemic to 3.0, she had also had low K on prior check 2 months before this She came back on 11/2 for recheck after potassium supplementation and her K was 3.5 at that time She does take hydrochlorothiazide 25 mg daily for blood pressure  Hypertension Current regimen: Hydrochlorothiazide 25 mg daily, losartan 25 mg nightly Doesn't check BP at home, but has a cuff Bps at last visits have been 110-150 SBP No chest pain or SOB  T2DM Current regimen: Metformin 500 mg twice daily Last A1c on 6/7 was 6.4 On losartan 25 mg daily Due for an eye exam in April Does not check her CBGs  COVID booster today Mammogram (ordered last visit) but not yet completed  PERTINENT  PMH / PSH: History of traumatic brain injury with cognitive impairment, history of anxiety, T2DM, hypothyroidism, HTN, HLD  OBJECTIVE:   BP 130/70   Pulse 64   Ht 5\' 4"  (1.626 m)   Wt 164 lb (74.4 kg)   SpO2 95%   BMI 28.15 kg/m    Physical Exam:  General: 70 y.o. female in NAD Lungs: Breathing comfortably on RA Skin: warm and dry Neuro: grossly intact, oriented to conversation, not oriented to time, unable to recall recent events Psych: Mood  and affect appropriate for circumstance, appropriate dress, somewhat tangential thought   Results for orders placed or performed in visit on 12/21/20 (from the past 24 hour(s))  POCT glycosylated hemoglobin (Hb A1C)     Status: Abnormal   Collection Time: 12/21/20 10:00 AM  Result Value Ref Range   Hemoglobin A1C 7.0 (A) 4.0 - 5.6 %   HbA1c POC (<> result, manual entry)     HbA1c, POC (prediabetic range)     HbA1c, POC (controlled diabetic range)       ASSESSMENT/PLAN:   Anxiety state Does appear to be not handling her anxiety well per her daughter's report.  Patient's husband called and said that she has been taking Celexa daily over the last month, but was only taking as needed.  Advised that this is a daily medication and not use as needed.  She has been well controlled on Klonopin 0.5 mg twice daily for quite some time and this seems to work very well for her, have discussed risks of this with patient and her family multiple times, but they would like to continue with this medication.  PMP reviewed, no red flags.  Refill provided for Klonopin.  She also uses hydroxyzine 25 mg daily.  She has been having some difficulty sleeping, but given her many sedating meds, advised changing the timing of Klonopin or hydroxyzine to give her at nighttime to hopefully help with sleep.  Also  discussed good sleep hygiene.  Offered therapy, but daughter declines at this time.  See below for further notes on cognitive impairment.  Cognitive impairment She has a history of traumatic brain injury that occurred in 2003 and has had memory impairment since that time.  Has also had poorly controlled anxiety since that time, has seem to be worsening recently per daughter's report.  She is concerned about her medications and is wanting to see if any changes need to be made.  She follows with neurology, but the daughter is not sure when her next appointment is or if she is still going somewhere at [facility] or [facility].   Given this, and her age, will go ahead and start her with [facility] clinic given her cognitive impairment and see if this has been worsening or if changes need to be made to her medications.  She is obviously on some beers list medications per above, therefore would appreciate some further assistance on this given that she is not well controlled.  We will have her follow-up with neurology as well.  Hypokalemia She has been intermittently hypokalemic and required intermittent supplementation.  Given that she is on hydrochlorothiazide, this is most likely the cause of her hypokalemia.  She has been on losartan 25 mg daily, therefore will increase this to 50 mg daily and discontinue hydrochlorothiazide.  We will also obtain a BMP today.  Hopefully with this change, she can keep her potassium within normal limits.  HYPERTENSION, BENIGN SYSTEMIC BP is within goal today at 130/70.  Given her age and other comorbidities especially her cognitive impairment, would have a goal of 140/90 for this patient.  Change to low pressure medicines per hypokalemia problem.  Discontinue hydrochlorothiazide and increase losartan to 50 mg daily.  Advised to check blood pressure daily.  Should come back in 1 month for BMP only.  BMP today.  Diabetes mellitus A1c is well controlled today.  Continue Metformin 500 mg twice daily.  Eye exam is up-to-date, encourage scheduling for April as well.  Does not check her CBGs which is fine.  Will defer foot exam to her next visit.  She is on statin and ARB.  Last lipid panel in June.  LDL was 90 at last check in October.  We will need to discuss increasing Lipitor from 40 mg to 80 mg at her next visit.   Patient received her Covid booster today. Advised to schedule mammogram and given phone number.  Cleophas Dunker, [place]

## 2020-12-21 ENCOUNTER — Ambulatory Visit (INDEPENDENT_AMBULATORY_CARE_PROVIDER_SITE_OTHER): Payer: Medicare Other | Admitting: Family Medicine

## 2020-12-21 ENCOUNTER — Other Ambulatory Visit: Payer: Self-pay

## 2020-12-21 ENCOUNTER — Encounter: Payer: Self-pay | Admitting: Family Medicine

## 2020-12-21 VITALS — BP 130/70 | HR 64 | Ht 64.0 in | Wt 164.0 lb

## 2020-12-21 DIAGNOSIS — E876 Hypokalemia: Secondary | ICD-10-CM

## 2020-12-21 DIAGNOSIS — I10 Essential (primary) hypertension: Secondary | ICD-10-CM | POA: Diagnosis not present

## 2020-12-21 DIAGNOSIS — Z23 Encounter for immunization: Secondary | ICD-10-CM

## 2020-12-21 DIAGNOSIS — R4189 Other symptoms and signs involving cognitive functions and awareness: Secondary | ICD-10-CM

## 2020-12-21 DIAGNOSIS — E119 Type 2 diabetes mellitus without complications: Secondary | ICD-10-CM

## 2020-12-21 DIAGNOSIS — F411 Generalized anxiety disorder: Secondary | ICD-10-CM

## 2020-12-21 LAB — POCT GLYCOSYLATED HEMOGLOBIN (HGB A1C): Hemoglobin A1C: 7 % — AB (ref 4.0–5.6)

## 2020-12-21 MED ORDER — LOSARTAN POTASSIUM 50 MG PO TABS: 50.0000 mg | ORAL_TABLET | Freq: Every day | ORAL | 0 refills | Status: DC

## 2020-12-21 MED ORDER — CLONAZEPAM 0.5 MG PO TABS: 0.5000 mg | ORAL_TABLET | Freq: Two times a day (BID) | ORAL | 0 refills | Status: DC | PRN

## 2020-12-21 NOTE — Patient Instructions (Signed)
Thank you for coming to see me today. It was a pleasure. Today we talked about:   We will get some labs today.  If they are abnormal or we need to do something about them, I will call you.  If they are normal, I will send you a message on MyChart (if it is active) or a letter in the mail.  If you don't hear from [place] in 2 weeks, please call the office at the number below.  Stop taking hydrochlorothiazide.  We have increased your losartan from 25mg  to 50mg .  Hopefully this will keep your blood pressure controlled and avoid your potassium going low again.  Check your blood pressure daily and keep a log.  Call to schedule an eye exam for April and ask them to fax [place] the results.  I have placed an order for your mammogram.  Please call [facility] at [phone] to schedule your appointment within one week.  Let them know she had a COVID booster.  We will refer her to Geriatric clinic and they will call you to schedule this.  Please follow-up with me in 3 months.  If you have any questions or concerns, please do not hesitate to call the office at [phone].  Best,   [place] Constable, DO

## 2020-12-21 NOTE — Assessment & Plan Note (Signed)
She has a history of traumatic brain injury that occurred in 2003 and has had memory impairment since that time.  Has also had poorly controlled anxiety since that time, has seem to be worsening recently per daughter's report.  She is concerned about her medications and is wanting to see if any changes need to be made.  She follows with neurology, but the daughter is not sure when her next appointment is or if she is still going somewhere at [facility] or [facility].  Given this, and her age, will go ahead and start her with [facility] clinic given her cognitive impairment and see if this has been worsening or if changes need to be made to her medications.  She is obviously on some beers list medications per above, therefore would appreciate some further assistance on this given that she is not well controlled.  We will have her follow-up with neurology as well.

## 2020-12-21 NOTE — Assessment & Plan Note (Signed)
She has been intermittently hypokalemic and required intermittent supplementation.  Given that she is on hydrochlorothiazide, this is most likely the cause of her hypokalemia.  She has been on losartan 25 mg daily, therefore will increase this to 50 mg daily and discontinue hydrochlorothiazide.  We will also obtain a BMP today.  Hopefully with this change, she can keep her potassium within normal limits.

## 2020-12-21 NOTE — Assessment & Plan Note (Addendum)
A1c is well controlled today.  Continue Metformin 500 mg twice daily.  Eye exam is up-to-date, encourage scheduling for April as well.  Does not check her CBGs which is fine.  Will defer foot exam to her next visit.  She is on statin and ARB.  Last lipid panel in June.  LDL was 90 at last check in October.  We will need to discuss increasing Lipitor from 40 mg to 80 mg at her next visit.

## 2020-12-21 NOTE — Assessment & Plan Note (Addendum)
BP is within goal today at 130/70.  Given her age and other comorbidities especially her cognitive impairment, would have a goal of 140/90 for this patient.  Change to low pressure medicines per hypokalemia problem.  Discontinue hydrochlorothiazide and increase losartan to 50 mg daily.  Advised to check blood pressure daily.  Should come back in 1 month for BMP only.  BMP today.

## 2020-12-21 NOTE — Assessment & Plan Note (Signed)
Does appear to be not handling her anxiety well per her daughter's report.  Patient's husband called and said that she has been taking Celexa daily over the last month, but was only taking as needed.  Advised that this is a daily medication and not use as needed.  She has been well controlled on Klonopin 0.5 mg twice daily for quite some time and this seems to work very well for her, have discussed risks of this with patient and her family multiple times, but they would like to continue with this medication.  PMP reviewed, no red flags.  Refill provided for Klonopin.  She also uses hydroxyzine 25 mg daily.  She has been having some difficulty sleeping, but given her many sedating meds, advised changing the timing of Klonopin or hydroxyzine to give her at nighttime to hopefully help with sleep.  Also discussed good sleep hygiene.  Offered therapy, but daughter declines at this time.  See below for further notes on cognitive impairment.

## 2020-12-22 ENCOUNTER — Encounter: Payer: Self-pay | Admitting: Family Medicine

## 2020-12-22 LAB — BASIC METABOLIC PANEL
BUN/Creatinine Ratio: 12 (ref 12–28)
BUN: 9 mg/dL (ref 8–27)
CO2: 25 mmol/L (ref 20–29)
Calcium: 9.4 mg/dL (ref 8.7–10.3)
Chloride: 102 mmol/L (ref 96–106)
Creatinine, Ser: 0.75 mg/dL (ref 0.57–1.00)
GFR calc Af Amer: 94 mL/min/{1.73_m2} (ref >59)
GFR calc non Af Amer: 82 mL/min/{1.73_m2} (ref >59)
Glucose: 135 mg/dL — ABNORMAL HIGH (ref 65–99)
Potassium: 3.6 mmol/L (ref 3.5–5.2)
Sodium: 143 mmol/L (ref 134–144)

## 2021-01-06 ENCOUNTER — Ambulatory Visit: Payer: Medicare Other | Admitting: Family Medicine

## 2021-01-23 ENCOUNTER — Other Ambulatory Visit: Payer: Self-pay | Admitting: Family Medicine

## 2021-01-23 DIAGNOSIS — F411 Generalized anxiety disorder: Secondary | ICD-10-CM

## 2021-01-26 ENCOUNTER — Ambulatory Visit: Payer: Medicare Other | Admitting: Family Medicine

## 2021-02-04 ENCOUNTER — Other Ambulatory Visit: Payer: Self-pay | Admitting: Family Medicine

## 2021-02-04 DIAGNOSIS — I10 Essential (primary) hypertension: Secondary | ICD-10-CM

## 2021-02-04 DIAGNOSIS — E039 Hypothyroidism, unspecified: Secondary | ICD-10-CM

## 2021-02-09 ENCOUNTER — Other Ambulatory Visit: Payer: Self-pay | Admitting: Family Medicine

## 2021-02-09 DIAGNOSIS — L301 Dyshidrosis [pompholyx]: Secondary | ICD-10-CM

## 2021-02-15 ENCOUNTER — Other Ambulatory Visit: Payer: Self-pay | Admitting: Family Medicine

## 2021-02-15 DIAGNOSIS — I10 Essential (primary) hypertension: Secondary | ICD-10-CM

## 2021-02-26 ENCOUNTER — Other Ambulatory Visit: Payer: Self-pay | Admitting: Family Medicine

## 2021-02-26 DIAGNOSIS — I10 Essential (primary) hypertension: Secondary | ICD-10-CM

## 2021-02-26 DIAGNOSIS — E785 Hyperlipidemia, unspecified: Secondary | ICD-10-CM

## 2021-02-26 DIAGNOSIS — F411 Generalized anxiety disorder: Secondary | ICD-10-CM

## 2021-02-27 NOTE — Telephone Encounter (Signed)
PMP reviewed, no red flags. No longer on HCTZ

## 2021-02-28 ENCOUNTER — Telehealth: Payer: Self-pay | Admitting: *Deleted

## 2021-02-28 NOTE — Progress Notes (Signed)
    SUBJECTIVE:   CHIEF COMPLAINT / HPI:   Headache New type of headache Across the front of her head and into both shoulders and upper back Pain goes up to the back of her head as well Started 6 days ago Has been constant, occasionally worsens randomly  No vision chanegs Has a history of migraines, but not similar to this She has taken imitrex and excedrin last night, the excedrin helped some, but not the imitrex No nausea or vomiting No photophobia, phonophobia "My legs feel like I have ran a marathon" with the headache She was doing more word puzzles recently No heavy lifting recently No N/T Now is 4/10, 10/10 at worst Last evening was a 10/10 Has also tried heat, helps some No recent falls or head injuries  Husband is present with patient today  PERTINENT  PMH / PSH: Hypertension, hypothyroidism, T2DM, HLD, cognitive impairment, depression, anxiety state  OBJECTIVE:   BP 138/75   Pulse 77   Ht 5' 2.5" (1.588 m)   Wt 164 lb 6.4 oz (74.6 kg)   SpO2 96%   BMI 29.59 kg/m    Physical Exam:  General: 70 y.o. female in NAD HEENT: NCAT, MMM, PERRL, EOMI Neck: Supple, ROM intact, mildly limited secondary to pain in flexion and extension Cardio: RRR no m/r/g Lungs: CTAB, no wheezing, no rhonchi, no crackles, no IWOB on RA MSK: Tenderness to palpation at origin of trapezius bilateral occiput, increased tonicity in upper trapezius, right more than left, trigger point noted in right upper trapezius just lateral to right scapular spine Skin: warm and dry Neuro: CN II through XII grossly intact, ambulates without difficulty, sensation intact throughout BUE/BLE, 5/5 strength BUE/BLE   ASSESSMENT/PLAN:   Tension headache Discussed with patient and her husband that symptoms are most likely related to a tension headache.  Did discuss however that given patient's age and that this is a new type of headache for her, would consider imaging of her brain.  They would like to hold  off at this time.  Advised that we cannot rule out a stroke or other intracranial process, but they would still like to hold off.  Given that her pain is most likely from spasm of her trapezius, offered suboccipital release, trigger point injection, but patient and her husband declined both.  Given her history of Klonopin use, would like to avoid sedating medications.  Given her age, one of the safest muscle relaxers would be baclofen, they agree to take this.  She can take baclofen 10 mg 3 times daily as needed for her pain.  Can continue with heat and massage as well.  Advised that if she is not having improvement, should return to care, at that time would definitely need imaging.  Also offered x-ray of her cervical spine, but they declined this as well.  Follow-up in 1 month for her other chronic medical problems.     Cleophas Dunker, [place]

## 2021-02-28 NOTE — Telephone Encounter (Signed)
Pt calls because she is having sharp shooting pains in her head, these have been spadic since Sunday.  Occasionally shooting down her arm.  There are no appts left but Dr. Sandi Carne agreed to see her tomorrow AM @ 8:15.  Pt informed and aware.  ED precautions given but pt denies chest pain, SOB or nausea at this time. Christen Bame, CMA

## 2021-03-01 ENCOUNTER — Encounter: Payer: Self-pay | Admitting: Family Medicine

## 2021-03-01 ENCOUNTER — Other Ambulatory Visit: Payer: Self-pay

## 2021-03-01 ENCOUNTER — Ambulatory Visit (INDEPENDENT_AMBULATORY_CARE_PROVIDER_SITE_OTHER): Payer: Medicare Other | Admitting: Family Medicine

## 2021-03-01 VITALS — BP 138/75 | HR 77 | Ht 62.5 in | Wt 164.4 lb

## 2021-03-01 DIAGNOSIS — G44209 Tension-type headache, unspecified, not intractable: Secondary | ICD-10-CM | POA: Diagnosis present

## 2021-03-01 DIAGNOSIS — G4489 Other headache syndrome: Secondary | ICD-10-CM | POA: Insufficient documentation

## 2021-03-01 MED ORDER — BACLOFEN 10 MG PO TABS: 10.0000 mg | ORAL_TABLET | Freq: Three times a day (TID) | ORAL | 0 refills | Status: DC

## 2021-03-01 NOTE — Assessment & Plan Note (Signed)
Discussed with patient and her husband that symptoms are most likely related to a tension headache.  Did discuss however that given patient's age and that this is a new type of headache for her, would consider imaging of her brain.  They would like to hold off at this time.  Advised that we cannot rule out a stroke or other intracranial process, but they would still like to hold off.  Given that her pain is most likely from spasm of her trapezius, offered suboccipital release, trigger point injection, but patient and her husband declined both.  Given her history of Klonopin use, would like to avoid sedating medications.  Given her age, one of the safest muscle relaxers would be baclofen, they agree to take this.  She can take baclofen 10 mg 3 times daily as needed for her pain.  Can continue with heat and massage as well.  Advised that if she is not having improvement, should return to care, at that time would definitely need imaging.  Also offered x-ray of her cervical spine, but they declined this as well.  Follow-up in 1 month for her other chronic medical problems.

## 2021-03-01 NOTE — Patient Instructions (Signed)
Thank you for coming to see me today. It was a pleasure. Today we talked about:   You can use heat and massage on your upper back.  You can use baclofen as needed for your pain.  Continue tylenol.    If this does not improve, I recommend imaging, so PLEASE let me know right away.  Please follow-up with me in 1 month.  If you have any questions or concerns, please do not hesitate to call the office at [phone].  Best,   [place] Constable, DO

## 2021-03-06 ENCOUNTER — Other Ambulatory Visit: Payer: Self-pay | Admitting: Family Medicine

## 2021-03-06 ENCOUNTER — Telehealth: Payer: Self-pay

## 2021-03-06 DIAGNOSIS — E119 Type 2 diabetes mellitus without complications: Secondary | ICD-10-CM

## 2021-03-06 DIAGNOSIS — G44209 Tension-type headache, unspecified, not intractable: Secondary | ICD-10-CM

## 2021-03-06 NOTE — Telephone Encounter (Signed)
Patient's husband calls nurse line to report that patient is continuing to have headaches. Husband reports that at visit on 4/7, provider requested they call back to office if headache persisted, with next steps of getting CT scan.   Please advise next steps.    Talbot Grumbling, RN

## 2021-03-07 ENCOUNTER — Encounter (HOSPITAL_COMMUNITY): Payer: Self-pay

## 2021-03-07 ENCOUNTER — Other Ambulatory Visit: Payer: Self-pay

## 2021-03-07 ENCOUNTER — Emergency Department (HOSPITAL_COMMUNITY): Payer: Medicare Other

## 2021-03-07 ENCOUNTER — Emergency Department (HOSPITAL_COMMUNITY)
Admission: EM | Admit: 2021-03-07 | Discharge: 2021-03-07 | Disposition: A | Discharge status: Home / Self Care | Payer: Medicare Other | Attending: Emergency Medicine | Admitting: Emergency Medicine

## 2021-03-07 DIAGNOSIS — Z79899 Other long term (current) drug therapy: Secondary | ICD-10-CM | POA: Diagnosis not present

## 2021-03-07 DIAGNOSIS — Z20822 Contact with and (suspected) exposure to covid-19: Secondary | ICD-10-CM | POA: Insufficient documentation

## 2021-03-07 DIAGNOSIS — R600 Localized edema: Secondary | ICD-10-CM

## 2021-03-07 DIAGNOSIS — G43909 Migraine, unspecified, not intractable, without status migrainosus: Secondary | ICD-10-CM | POA: Insufficient documentation

## 2021-03-07 DIAGNOSIS — Z7982 Long term (current) use of aspirin: Secondary | ICD-10-CM | POA: Insufficient documentation

## 2021-03-07 DIAGNOSIS — R519 Headache, unspecified: Secondary | ICD-10-CM

## 2021-03-07 DIAGNOSIS — E119 Type 2 diabetes mellitus without complications: Secondary | ICD-10-CM | POA: Insufficient documentation

## 2021-03-07 DIAGNOSIS — Z7984 Long term (current) use of oral hypoglycemic drugs: Secondary | ICD-10-CM | POA: Diagnosis not present

## 2021-03-07 DIAGNOSIS — J029 Acute pharyngitis, unspecified: Secondary | ICD-10-CM

## 2021-03-07 DIAGNOSIS — Z91010 Allergy to peanuts: Secondary | ICD-10-CM | POA: Diagnosis not present

## 2021-03-07 DIAGNOSIS — E039 Hypothyroidism, unspecified: Secondary | ICD-10-CM | POA: Diagnosis not present

## 2021-03-07 DIAGNOSIS — I10 Essential (primary) hypertension: Secondary | ICD-10-CM | POA: Insufficient documentation

## 2021-03-07 LAB — CBC WITH DIFFERENTIAL/PLATELET
Abs Immature Granulocytes: 0.01 10*3/uL (ref 0.00–0.07)
Basophils Absolute: 0 10*3/uL (ref 0.0–0.1)
Basophils Relative: 0 %
Eosinophils Absolute: 0.1 10*3/uL (ref 0.0–0.5)
Eosinophils Relative: 2 %
HCT: 34.2 % — ABNORMAL LOW (ref 36.0–46.0)
Hemoglobin: 11.2 g/dL — ABNORMAL LOW (ref 12.0–15.0)
Immature Granulocytes: 0 %
Lymphocytes Relative: 22 %
Lymphs Abs: 1.2 10*3/uL (ref 0.7–4.0)
MCH: 31.8 pg (ref 26.0–34.0)
MCHC: 32.7 g/dL (ref 30.0–36.0)
MCV: 97.2 fL (ref 80.0–100.0)
Monocytes Absolute: 0.3 10*3/uL (ref 0.1–1.0)
Monocytes Relative: 6 %
Neutro Abs: 3.8 10*3/uL (ref 1.7–7.7)
Neutrophils Relative %: 70 %
Platelets: 307 10*3/uL (ref 150–400)
RBC: 3.52 MIL/uL — ABNORMAL LOW (ref 3.87–5.11)
RDW: 14.1 % (ref 11.5–15.5)
WBC: 5.4 10*3/uL (ref 4.0–10.5)
nRBC: 0 % (ref 0.0–0.2)

## 2021-03-07 LAB — I-STAT CHEM 8, ED
BUN: 8 mg/dL (ref 8–23)
Calcium, Ion: 1.08 mmol/L — ABNORMAL LOW (ref 1.15–1.40)
Chloride: 102 mmol/L (ref 98–111)
Creatinine, Ser: 0.7 mg/dL (ref 0.44–1.00)
Glucose, Bld: 132 mg/dL — ABNORMAL HIGH (ref 70–99)
HCT: 35 % — ABNORMAL LOW (ref 36.0–46.0)
Hemoglobin: 11.9 g/dL — ABNORMAL LOW (ref 12.0–15.0)
Potassium: 2.8 mmol/L — ABNORMAL LOW (ref 3.5–5.1)
Sodium: 142 mmol/L (ref 135–145)
TCO2: 29 mmol/L (ref 22–32)

## 2021-03-07 LAB — BASIC METABOLIC PANEL
Anion gap: 9 (ref 5–15)
BUN: 7 mg/dL — ABNORMAL LOW (ref 8–23)
CO2: 26 mmol/L (ref 22–32)
Calcium: 8.5 mg/dL — ABNORMAL LOW (ref 8.9–10.3)
Chloride: 105 mmol/L (ref 98–111)
Creatinine, Ser: 0.61 mg/dL (ref 0.44–1.00)
GFR, Estimated: >60 mL/min (ref >60)
Glucose, Bld: 87 mg/dL (ref 70–99)
Potassium: 3.4 mmol/L — ABNORMAL LOW (ref 3.5–5.1)
Sodium: 140 mmol/L (ref 135–145)

## 2021-03-07 LAB — URINALYSIS, ROUTINE W REFLEX MICROSCOPIC
Bilirubin Urine: NEGATIVE
Glucose, UA: NEGATIVE mg/dL
Hgb urine dipstick: NEGATIVE
Ketones, ur: NEGATIVE mg/dL
Nitrite: NEGATIVE
Protein, ur: NEGATIVE mg/dL
Specific Gravity, Urine: >1.046 — ABNORMAL HIGH (ref 1.005–1.030)
pH: 7 (ref 5.0–8.0)

## 2021-03-07 LAB — GROUP A STREP BY PCR: Group A Strep by PCR: NOT DETECTED

## 2021-03-07 LAB — RESP PANEL BY RT-PCR (FLU A&B, COVID) ARPGX2
Influenza A by PCR: NEGATIVE
Influenza B by PCR: NEGATIVE
SARS Coronavirus 2 by RT PCR: NEGATIVE

## 2021-03-07 MED ORDER — IOHEXOL 350 MG/ML SOLN
80.0000 mL | Freq: Once | INTRAVENOUS | Status: AC | PRN
  Administered 2021-03-07: 80 mL via INTRAVENOUS

## 2021-03-07 MED ORDER — METHYLPREDNISOLONE 4 MG PO TBPK: ORAL_TABLET | ORAL | 0 refills | Status: DC

## 2021-03-07 MED ORDER — PROCHLORPERAZINE EDISYLATE 10 MG/2ML IJ SOLN
10.0000 mg | Freq: Once | INTRAMUSCULAR | Status: AC
  Administered 2021-03-07: 10 mg via INTRAVENOUS
  Filled 2021-03-07: qty 2

## 2021-03-07 MED ORDER — POTASSIUM CHLORIDE CRYS ER 20 MEQ PO TBCR
40.0000 meq | EXTENDED_RELEASE_TABLET | Freq: Once | ORAL | Status: AC
  Administered 2021-03-07: 40 meq via ORAL
  Filled 2021-03-07: qty 2

## 2021-03-07 MED ORDER — CLINDAMYCIN HCL 150 MG PO CAPS: 300.0000 mg | ORAL_CAPSULE | Freq: Three times a day (TID) | ORAL | 0 refills | Status: AC

## 2021-03-07 MED ORDER — MORPHINE SULFATE (PF) 4 MG/ML IV SOLN
4.0000 mg | Freq: Once | INTRAVENOUS | Status: AC
Start: 2021-03-07 — End: 2021-03-07
  Administered 2021-03-07: 4 mg via INTRAVENOUS
  Filled 2021-03-07: qty 1

## 2021-03-07 MED ORDER — POTASSIUM CHLORIDE 10 MEQ/100ML IV SOLN
10.0000 meq | Freq: Once | INTRAVENOUS | Status: AC
  Administered 2021-03-07: 10 meq via INTRAVENOUS
  Filled 2021-03-07: qty 100

## 2021-03-07 MED ORDER — GADOBUTROL 1 MMOL/ML IV SOLN
7.0000 mL | Freq: Once | INTRAVENOUS | Status: AC | PRN
  Administered 2021-03-07: 7 mL via INTRAVENOUS

## 2021-03-07 MED ORDER — DIPHENHYDRAMINE HCL 50 MG/ML IJ SOLN
12.5000 mg | Freq: Once | INTRAMUSCULAR | Status: AC
  Administered 2021-03-07: 12.5 mg via INTRAVENOUS
  Filled 2021-03-07: qty 1

## 2021-03-07 NOTE — ED Notes (Signed)
Pt ambulated to restroom. Pt stated she felt dizzy once she first stood up.

## 2021-03-07 NOTE — ED Provider Notes (Signed)
  Physical Exam  BP (!) 179/89   Pulse (!) 56   Temp 97.7 F (36.5 C) (Oral)   Resp 14   Ht 5\' 3"  (1.6 m)   Wt 65.8 kg   SpO2 96%   BMI 25.69 kg/m   Physical Exam  ED Course/Procedures   Clinical Course as of 03/07/21 0811  Tue Mar 07, 2021  0453 CT scan reviewed.  Patient with some increased tissue density of the retropharyngeal space/prevertebral space.  No obvious abscess.  No evidence of tonsillitis.  On exam, no oropharyngeal erythema, tonsillar exudate, no tonsillar swelling.  No muffled voice.  She has had some sore throat and dysphagia.  No fevers. [CH]    Clinical Course User Index [CH] [place], Barbette Hair, MD    Procedures  MDM  Received care from Dr. Dina Rich. Please see her note for prior history, physical and care. Briefly, this is a 70yo female who presented with concern for headache. CTA showed retropharyngeal edema, with MRI recommended to evaluate for possible cervical discitis/osteomyelitis.   MRI returned showing nonspecific prevertebral fluid or edema, minimal edema of C3 inferior endplate which is nonspecific and may be degenerative. She has no leukocytosis, no fever.  Possible degenerative changes, close follow up and consideration of contrast enhanced MRI may be considered.  Discussed preverebral.retropharyngeal fluid with Dr. Wilburn Cornelia ENT, recommends steroids, empiric abx, outpatient follow up in 10 days.  Nonspecific retropharyngeal edema, does not have drooling, significant dysphonia, dyspnea, fever or leukocytosis.  Feels she is stable for outpatient follow up with ENT and PCP. Given rx for medrol dose pack with discussion of hyperglycemia risks as well as rx for clindamycin. COVID testing ordered and pending.    ISTAT K 2.8, given replacement earlier. BMP ordered to evaluate in case of lab abnormalities shows K 3.4.   Patient discharged in stable condition with understanding of reasons to return and instructions for close follow up with PCP and ENT.        Gareth Morgan, MD 03/07/21 [phone]

## 2021-03-07 NOTE — ED Notes (Signed)
To MRI @ this time.

## 2021-03-07 NOTE — ED Notes (Signed)
Pt still in MRI 

## 2021-03-07 NOTE — ED Notes (Signed)
Pt in stretcher @ this timeVSS. Cardiac monitoring in place w/ bp cycling and spo2 being monnitored. Updated on plan of care on awaiting to go to MRI. Deny needs or concerns @ this time. Bed low and locked. Husband @ bedside. Side rails up x2.

## 2021-03-07 NOTE — ED Notes (Signed)
Returned from MRI 

## 2021-03-07 NOTE — ED Provider Notes (Signed)
[facility] Provider Note   CSN: [id number] Arrival date & time: 03/07/21  0054     History Chief Complaint  Patient presents with  . Headache    [name] is a 70 y.o. female.  HPI     This is 70 year old female with a history of diabetes, hypertension, hyperlipidemia who presents with headache.  Patient reports she has had significant headache over the last 10 days.  She has a history of migraines but has not had any issues with migraines recently.  She reports intense sharp headache that originates in the bilateral posterior neck and shoulder region and includes throbbing in the forehead.  This is different character of pain than her prior migraines.  She denies any weakness, numbness, tingling, strokelike symptoms.  She was seen and evaluated by her primary physician and thought to have tension headaches.  She has been using anti-inflammatories at home with no relief.  She currently rates her pain 8 out of 10.  She describes the pain as the worst headache of her life.  Denies photophobia or nausea.  Past Medical History:  Diagnosis Date  . Allergy   . Anxiety   . Arthritis   . Diabetes mellitus without complication ([place])    on metformin  . Hx of adenomatous colonic polyps 01/26/2015   and also 2021 - recall 2024  . Hyperlipidemia   . Hypertension   . Memory loss of unknown cause 03/04/2002   suspect depression with psychotic features. patient hospitalized and work-up negative.   . Panic attack    5-6 a day per pt and husband   . Seizures ([place])    per husband last seizure 3 yeras ago- documented 06-29-2020  . Thyroid disease    thyroid surgery when young    Patient Active Problem List   Diagnosis Date Noted  . Tension headache 03/01/2021  . Hypokalemia 09/21/2020  . Cough due to ACE inhibitor 06/16/2020  . 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 Diabetes mellitus ([place]) 04/21/2015  . Hx of adenomatous colonic polyps 01/26/2015  .  Seizure ([place]) 02/23/2010  . Disturbance in sleep behavior 01/05/2010  . Depression 09/13/2009  . Anxiety state 09/03/2007  . Hypothyroidism 01/23/2007  . Hyperlipidemia 01/23/2007  . Cognitive impairment 01/23/2007  . HYPERTENSION, BENIGN SYSTEMIC 01/23/2007    Past Surgical History:  Procedure Laterality Date  . BUNIONECTOMY Bilateral   . COLONOSCOPY    . THYROIDECTOMY, PARTIAL    . TONSILLECTOMY       OB History   No obstetric history on file.     Family History  Problem Relation Age of Onset  . Colon cancer Neg Hx   . Rectal cancer Neg Hx   . Stomach cancer Neg Hx   . Colon polyps Neg Hx   . Esophageal cancer Neg Hx     Social History   Tobacco Use  . Smoking status: Never Smoker  . Smokeless tobacco: Never Used  Vaping Use  . Vaping Use: Never used  Substance Use Topics  . Alcohol use: No    Alcohol/week: 0.0 standard drinks  . Drug use: No    Home Medications Prior to Admission medications   Medication Sig Start Date End Date Taking? Authorizing Provider  aspirin EC 81 MG tablet Take 1 tablet (81 mg total) by mouth daily. 12/18/18   Glenis Smoker, MD  atorvastatin (LIPITOR) 40 MG tablet TAKE ONE TABLET BY MOUTH DAILY AT 6 PM. 02/27/21   Coolidge,  Bernita Raisin, DO  baclofen (LIORESAL) 10 MG tablet Take 1 tablet (10 mg total) by mouth 3 (three) times daily. 03/01/21   Meccariello, Bernita Raisin, DO  citalopram (CELEXA) 20 MG tablet Take 1 tablet by mouth once daily 01/23/21   Meccariello, Bernita Raisin, DO  cloBAZam (ONFI) 10 MG tablet Take 10 mg by mouth at bedtime.  02/05/20   [provider]  clonazePAM (KLONOPIN) 0.5 MG tablet Take 1 tablet by mouth twice daily as needed for anxiety 02/27/21   Meccariello, Bernita Raisin, DO  EUTHYROX 75 MCG tablet TAKE ONE TABLET BY MOUTH DAILY FOR HYPOTHYROIDISM. 02/06/21   Meccariello, Bernita Raisin, DO  hydrOXYzine (VISTARIL) 25 MG capsule TAKE 1 CAPSULE BY MOUTH TWICE DAILY AS NEEDED 04/27/20   Meccariello, Bernita Raisin, DO   levETIRAcetam (KEPPRA) 500 MG tablet Take 3 tablets (1,500 mg total) by mouth 2 (two) times daily. 07/26/17 06/29/20  Mercy Riding, MD  levocetirizine (XYZAL) 5 MG tablet Take 1 tablet by mouth every evening. 01/05/15   [provider]  losartan (COZAAR) 50 MG tablet Take 1 tablet (50 mg total) by mouth at bedtime. 12/21/20   Meccariello, Bernita Raisin, DO  meclizine (ANTIVERT) 12.5 MG tablet Take by mouth.    [provider]  metFORMIN (GLUCOPHAGE) 500 MG tablet TAKE 1 TABLET BY MOUTH TWICE DAILY WITH A MEAL 12/06/20   Meccariello, Bernita Raisin, DO  potassium chloride 20 MEQ TBCR Take 20 mEq by mouth daily for 5 days. 09/22/20 09/27/20  Meccariello, Bernita Raisin, DO  predniSONE (DELTASONE) 20 MG tablet Take 1 tablet (20 mg total) by mouth daily with breakfast. 08/05/20   Arby Barrette, [place] J, DO  QUEtiapine (SEROQUEL) 50 MG tablet TAKE 1/2 (ONE-HALF) TABLET BY MOUTH AT BEDTIME 12/06/20   Meccariello, Bernita Raisin, DO  SUMAtriptan (IMITREX) 25 MG tablet Take 1 tablet (25 mg total) by mouth every 2 (two) hours as needed for migraine. May repeat in 2 hours if headache persists or recurs. 02/12/20   Meccariello, Bernita Raisin, DO  triamcinolone (KENALOG) 0.1 % APPLY ONE APPLICATION TOPICALLY TWO TIMES DAILY 02/10/21   Meccariello, Bernita Raisin, DO    Allergies    Chocolate, Coffee bean extract [coffea arabica], and Peanut-containing drug products  Review of Systems   Review of Systems  Constitutional: Negative for fever.  Respiratory: Negative for shortness of breath.   Cardiovascular: Negative for chest pain.  Gastrointestinal: Negative for abdominal pain, nausea and vomiting.  Genitourinary: Negative for dysuria.  Neurological: Positive for headaches. Negative for dizziness and weakness.  All other systems reviewed and are negative.   Physical Exam Updated Vital Signs BP (!) 155/91   Pulse (!) 54   Temp 97.7 F (36.5 C) (Oral)   Resp 14   Ht 1.6 m (5\' 3" )   Wt 65.8 kg   SpO2 93%   BMI 25.69 kg/m    Physical Exam Vitals and nursing note reviewed.  Constitutional:      Appearance: She is well-developed. She is not ill-appearing.  HENT:     Head: Normocephalic and atraumatic.     Mouth/Throat:     Mouth: Mucous membranes are moist.  Eyes:     Extraocular Movements: Extraocular movements intact.     Right eye: No nystagmus.     Left eye: No nystagmus.     Pupils: Pupils are equal, round, and reactive to light.  Cardiovascular:     Rate and Rhythm: Normal rate and regular rhythm.     Heart sounds: Normal  heart sounds.  Pulmonary:     Effort: Pulmonary effort is normal. No respiratory distress.     Breath sounds: No wheezing.  Abdominal:     General: Bowel sounds are normal.     Palpations: Abdomen is soft.  Musculoskeletal:     Cervical back: Normal range of motion and neck supple. No rigidity.  Lymphadenopathy:     Cervical: No cervical adenopathy.  Skin:    General: Skin is warm and dry.  Neurological:     Mental Status: She is alert and oriented to person, place, and time.     Comments: 5 out of strength in all 4 extremities, no dysmetria to finger-nose-finger, cranial nerves II through XII intact  Psychiatric:        Mood and Affect: Mood normal.     ED Results / Procedures / Treatments   Labs (all labs ordered are listed, but only abnormal results are displayed) Labs Reviewed  CBC WITH DIFFERENTIAL/PLATELET - Abnormal; Notable for the following components:      Result Value   RBC 3.52 (*)    Hemoglobin 11.2 (*)    HCT 34.2 (*)    All other components within normal limits  I-STAT CHEM 8, ED - Abnormal; Notable for the following components:   Potassium 2.8 (*)    Glucose, Bld 132 (*)    Calcium, Ion 1.08 (*)    Hemoglobin 11.9 (*)    HCT 35.0 (*)    All other components within normal limits  GROUP A STREP BY PCR  BASIC METABOLIC PANEL    EKG None  Radiology CT Angio Head W or Wo Contrast  Result Date: 03/07/2021 CLINICAL DATA:  70 year old female  with worst headache of life. Persistent headache for 1-2 weeks. Neck pain. EXAM: CT ANGIOGRAPHY HEAD AND NECK TECHNIQUE: Multidetector CT imaging of the head and neck was performed using the standard protocol during bolus administration of intravenous contrast. Multiplanar CT image reconstructions and MIPs were obtained to evaluate the vascular anatomy. Carotid stenosis measurements (when applicable) are obtained utilizing NASCET criteria, using the distal internal carotid diameter as the denominator. CONTRAST:  82mL OMNIPAQUE IOHEXOL 350 MG/ML SOLN COMPARISON:  Brain MRI 12/19/2009 and earlier, head CT 08/25/2005. FINDINGS: CT HEAD Brain: Mild Calcified atherosclerosis at the skull base. Cerebral volume loss since 2006 appears generalized. Associated ex vacuo enlargement since that time. No midline shift, ventriculomegaly, mass effect, evidence of mass lesion, intracranial hemorrhage or evidence of cortically based acute infarction. Patchy bilateral white matter hypodensity is new from prior exams, mild for age. No cortical encephalomalacia identified. Calvarium and skull base: Negative. Paranasal sinuses: Visualized paranasal sinuses and mastoids are stable and well aerated. Orbits: Visualized orbits and scalp soft tissues are within normal limits. CTA NECK Skeleton: Absent dentition. Mild for age cervical spine degeneration. No acute osseous abnormality identified. Upper chest: Negative aside from mild atelectasis in the upper lungs. Other neck: Diminutive mildly heterogeneous thyroid Not clinically significant; no follow-up imaging recommended (ref: J Am Coll Radiol. 2015 Feb;12(2): 143-50). Abnormal prevertebral/retropharyngeal fluid or edema (series 7, image 102). No associated tonsillar hypertrophy or hyperenhancement. Parapharyngeal spaces remain within normal limits. No associated cervical spine endplate erosion. No dystrophic calcifications of the longus coli musculature identified. Sublingual,  submandibular, masticator and parotid spaces are within normal limits. No cervical lymphadenopathy. No soft tissue gas in the neck. Aortic arch: Tortuous aortic arch with minimal atherosclerosis. Bovine arch configuration. Diffusely tortuous proximal great vessels. Right carotid system: Tortuous brachiocephalic and proximal right CCA  without plaque or stenosis. No plaque at the right carotid bifurcation. Mildly tortuous cervical right ICA. Left carotid system: Negative aside from tortuosity. No plaque or stenosis. Vertebral arteries: Proximal right subclavian artery and right vertebral artery origin are normal. Right vertebral artery is mildly non dominant, patent and normal to the skull base. Tortuous proximal left subclavian artery with mild soft plaque, no stenosis. Normal left vertebral artery origin. Tortuous left V1 segment. Patent left vertebral to the skull base without plaque or stenosis. CTA HEAD Posterior circulation: Patent distal vertebral arteries and vertebrobasilar junction with mild tortuosity. No plaque or stenosis. Normal bilateral PICA origins. Patent basilar artery with tortuosity, no stenosis. Patent SCA and PCA origins. Posterior communicating arteries are diminutive or absent. Tortuous left P1. Bilateral PCA branches are within normal limits. Anterior circulation: Both ICA siphons are patent. Mild bilateral calcified siphon plaque greater on the left. No stenosis. Mild bilateral siphon tortuosity. Patent carotid termini. Normal MCA and ACA origins. Diminutive anterior communicating artery. Proximal ACAs are within normal limits. There is mild to moderate right ACA distal A2 or A3 stenosis on series 16, image 18. Left MCA M1 segment and bifurcation are patent without stenosis. Right MCA M1 segment and trifurcation are patent without stenosis. Bilateral MCA branches are within normal limits. No intracranial aneurysm. Venous sinuses: Patent. Anatomic variants: Mildly dominant left vertebral  artery. Review of the MIP images confirms the above findings IMPRESSION: 1. Abnormal but nonspecific prevertebral/retropharyngeal space fluid or edema. No associated changes of tonsillitis. No cervical lymphadenopathy. No dystrophic calcifications of the longus coli muscle to suggest benign inflammatory etiology. No acute CT changes evident in the cervical spine. Recommend follow-up Cervical Spine MRI without and with contrast to exclude the possibility of cervical discitis osteomyelitis. 2. Largely normal arterial findings on CTA Head and Neck. Generalized arterial tortuosity. No atherosclerosis or stenosis in the neck. No intracranial aneurysm. There is mild to moderate Right ACA distal A2/A3 stenosis. 3. CT Head is negative for intracranial hemorrhage or cortically based infarct. Generalized cerebral volume loss since 2011. Mild for age white matter changes most commonly due to chronic small vessel disease. Electronically Signed   By: Genevie Ann M.D.   On: 03/07/2021 04:29   CT Angio Neck W and/or Wo Contrast  Result Date: 03/07/2021 CLINICAL DATA:  70 year old female with worst headache of life. Persistent headache for 1-2 weeks. Neck pain. EXAM: CT ANGIOGRAPHY HEAD AND NECK TECHNIQUE: Multidetector CT imaging of the head and neck was performed using the standard protocol during bolus administration of intravenous contrast. Multiplanar CT image reconstructions and MIPs were obtained to evaluate the vascular anatomy. Carotid stenosis measurements (when applicable) are obtained utilizing NASCET criteria, using the distal internal carotid diameter as the denominator. CONTRAST:  8mL OMNIPAQUE IOHEXOL 350 MG/ML SOLN COMPARISON:  Brain MRI 12/19/2009 and earlier, head CT 08/25/2005. FINDINGS: CT HEAD Brain: Mild Calcified atherosclerosis at the skull base. Cerebral volume loss since 2006 appears generalized. Associated ex vacuo enlargement since that time. No midline shift, ventriculomegaly, mass effect, evidence of  mass lesion, intracranial hemorrhage or evidence of cortically based acute infarction. Patchy bilateral white matter hypodensity is new from prior exams, mild for age. No cortical encephalomalacia identified. Calvarium and skull base: Negative. Paranasal sinuses: Visualized paranasal sinuses and mastoids are stable and well aerated. Orbits: Visualized orbits and scalp soft tissues are within normal limits. CTA NECK Skeleton: Absent dentition. Mild for age cervical spine degeneration. No acute osseous abnormality identified. Upper chest: Negative aside from mild atelectasis in  the upper lungs. Other neck: Diminutive mildly heterogeneous thyroid Not clinically significant; no follow-up imaging recommended (ref: J Am Coll Radiol. 2015 Feb;12(2): 143-50). Abnormal prevertebral/retropharyngeal fluid or edema (series 7, image 102). No associated tonsillar hypertrophy or hyperenhancement. Parapharyngeal spaces remain within normal limits. No associated cervical spine endplate erosion. No dystrophic calcifications of the longus coli musculature identified. Sublingual, submandibular, masticator and parotid spaces are within normal limits. No cervical lymphadenopathy. No soft tissue gas in the neck. Aortic arch: Tortuous aortic arch with minimal atherosclerosis. Bovine arch configuration. Diffusely tortuous proximal great vessels. Right carotid system: Tortuous brachiocephalic and proximal right CCA without plaque or stenosis. No plaque at the right carotid bifurcation. Mildly tortuous cervical right ICA. Left carotid system: Negative aside from tortuosity. No plaque or stenosis. Vertebral arteries: Proximal right subclavian artery and right vertebral artery origin are normal. Right vertebral artery is mildly non dominant, patent and normal to the skull base. Tortuous proximal left subclavian artery with mild soft plaque, no stenosis. Normal left vertebral artery origin. Tortuous left V1 segment. Patent left vertebral to the  skull base without plaque or stenosis. CTA HEAD Posterior circulation: Patent distal vertebral arteries and vertebrobasilar junction with mild tortuosity. No plaque or stenosis. Normal bilateral PICA origins. Patent basilar artery with tortuosity, no stenosis. Patent SCA and PCA origins. Posterior communicating arteries are diminutive or absent. Tortuous left P1. Bilateral PCA branches are within normal limits. Anterior circulation: Both ICA siphons are patent. Mild bilateral calcified siphon plaque greater on the left. No stenosis. Mild bilateral siphon tortuosity. Patent carotid termini. Normal MCA and ACA origins. Diminutive anterior communicating artery. Proximal ACAs are within normal limits. There is mild to moderate right ACA distal A2 or A3 stenosis on series 16, image 18. Left MCA M1 segment and bifurcation are patent without stenosis. Right MCA M1 segment and trifurcation are patent without stenosis. Bilateral MCA branches are within normal limits. No intracranial aneurysm. Venous sinuses: Patent. Anatomic variants: Mildly dominant left vertebral artery. Review of the MIP images confirms the above findings IMPRESSION: 1. Abnormal but nonspecific prevertebral/retropharyngeal space fluid or edema. No associated changes of tonsillitis. No cervical lymphadenopathy. No dystrophic calcifications of the longus coli muscle to suggest benign inflammatory etiology. No acute CT changes evident in the cervical spine. Recommend follow-up Cervical Spine MRI without and with contrast to exclude the possibility of cervical discitis osteomyelitis. 2. Largely normal arterial findings on CTA Head and Neck. Generalized arterial tortuosity. No atherosclerosis or stenosis in the neck. No intracranial aneurysm. There is mild to moderate Right ACA distal A2/A3 stenosis. 3. CT Head is negative for intracranial hemorrhage or cortically based infarct. Generalized cerebral volume loss since 2011. Mild for age white matter changes  most commonly due to chronic small vessel disease. Electronically Signed   By: Genevie Ann M.D.   On: 03/07/2021 04:29    Procedures Procedures   Medications Ordered in ED Medications  prochlorperazine (COMPAZINE) injection 10 mg (10 mg Intravenous Given 03/07/21 0256)  diphenhydrAMINE (BENADRYL) injection 12.5 mg (12.5 mg Intravenous Given 03/07/21 0255)  iohexol (OMNIPAQUE) 350 MG/ML injection 80 mL (80 mLs Intravenous Contrast Given 03/07/21 0356)  potassium chloride 10 mEq in 100 mL IVPB (0 mEq Intravenous Stopped 03/07/21 0519)  potassium chloride SA (KLOR-CON) CR tablet 40 mEq (40 mEq Oral Given 03/07/21 0409)  morphine 4 MG/ML injection 4 mg (4 mg Intravenous Given 03/07/21 0501)    ED Course  I have reviewed the triage vital signs and the nursing notes.  Pertinent labs &  imaging results that were available during my care of the patient were reviewed by me and considered in my medical decision making (see chart for details).  Clinical Course as of 03/07/21 0742  Tue Mar 07, 2021  2202 CT scan reviewed.  Patient with some increased tissue density of the retropharyngeal space/prevertebral space.  No obvious abscess.  No evidence of tonsillitis.  On exam, no oropharyngeal erythema, tonsillar exudate, no tonsillar swelling.  No muffled voice.  She has had some sore throat and dysphagia.  No fevers. [CH]    Clinical Course User Index [CH] Ke[place]un[place] Skipper, Barbette Hair, MD   MDM Rules/Calculators/A&P                          Patient presents with headache.  Overall nontoxic-appearing and vital signs are reassuring.  She is afebrile.  Normal range of motion of the neck.  Doubt infection such as meningitis.  She does describe it is worst headache of her life and it is different than her migraines.  For this reason we will obtain CT, CTA head and neck.  Patient was given a migraine cocktail.  CT scan as above.  See clinical course.  Patient reports some dysphagia but she has normal phonation and no muffled  voice.  No fever.  No leukocytosis.  But have very low suspicion for RPA.  Will obtain MRI as recommended by radiologist.  Of note, potassium slightly low at 2.8.  This was replaced as well.  On recheck, patient continuing to have pain.  Patient was given IV morphine.  But signed out to oncoming provider.   Final Clinical Impression(s) / ED Diagnoses Final diagnoses:  None    Rx / DC Orders ED Discharge Orders    None       Kemauri Musa, Barbette Hair, MD 03/07/21 [phone]

## 2021-03-07 NOTE — ED Triage Notes (Addendum)
Pt bib [facility]. Pt c/o headache x 1-2 weeks, seen for same previously. Denies nausea, vomiting, weakness or dizziness.  Pt states, "this is the worst headache of my life."

## 2021-03-07 NOTE — Telephone Encounter (Signed)
Called to discuss current headache and order CT, however prior to completing call noted that she was seen in the ED this morning after headache became significantly more severe last night.  CT/MRI showing no acute mass or intracranial bleed, however does show some nonspecific retropharyngeal edema.  They spoke with ENT who has placed her on antibiotics and steroids.  Call patient to check in after her visit this morning, spoke with her daughter.  She reports that her headache is doing much better and she is currently resting.  Scheduled appointment with Dr. Jerilynn Mages on 4/18 at 2:30 PM to check in.  Return ED precautions discussed.

## 2021-03-07 NOTE — ED Notes (Signed)
To CT in stretcher. NAD noted.

## 2021-03-07 NOTE — ED Notes (Signed)
Returned from [place]. In stretcher on monitoring devices. NAD noted.

## 2021-03-07 NOTE — ED Notes (Signed)
Unable to obtain vitals at this time due to pt being in MRI.

## 2021-03-13 ENCOUNTER — Ambulatory Visit: Payer: Medicare Other

## 2021-03-13 NOTE — Progress Notes (Deleted)
    SUBJECTIVE:   CHIEF COMPLAINT / HPI:   Headache follow-up Last seen on 03/01/2021 Thought to be most likely consistent with tension headache At that time had offered CT scan, but patient and her husband declined She was then seen on 03/07/2021 in the ED Had CTA that showed retropharyngeal edema Then MRI due to possible cervical discitis/osteomyelitis Which showed possible degenerative changes Had discussed with ENT who recommended steroids and empiric antibiotics with follow-up in 10 days Given Medrol Dosepak and clindamycin She was given Benadryl and Compazine, then morphine in the ED ***  Hypokalemia On BMP on 4/12 she was initially hypokalemic to 2.8 on i-STAT, then full BMP 5 hours later showed 3.4 after repletion She has been intermittently hypokalemic in the past Hydrochlorothiazide was discontinued because of this and her losartan was increased ***   PERTINENT  PMH / PSH: ***  OBJECTIVE:   There were no vitals taken for this visit.  ***  ASSESSMENT/PLAN:   No problem-specific Assessment & Plan notes found for this encounter.     Cleophas Dunker, DO [place]   {    This will disappear when note is signed, click to select method of visit    :1}

## 2021-03-22 ENCOUNTER — Other Ambulatory Visit: Payer: Self-pay | Admitting: Family Medicine

## 2021-03-22 DIAGNOSIS — I10 Essential (primary) hypertension: Secondary | ICD-10-CM

## 2021-03-22 DIAGNOSIS — E119 Type 2 diabetes mellitus without complications: Secondary | ICD-10-CM

## 2021-03-22 DIAGNOSIS — F411 Generalized anxiety disorder: Secondary | ICD-10-CM

## 2021-03-22 NOTE — Telephone Encounter (Signed)
PMP reviewe,d no red flags.  Rx for klonopin can be filled on 5/5

## 2021-03-23 ENCOUNTER — Encounter (HOSPITAL_COMMUNITY): Payer: Self-pay | Admitting: Emergency Medicine

## 2021-03-23 ENCOUNTER — Other Ambulatory Visit: Payer: Self-pay

## 2021-03-23 ENCOUNTER — Emergency Department (HOSPITAL_COMMUNITY)
Admission: EM | Admit: 2021-03-23 | Discharge: 2021-03-23 | Disposition: A | Discharge status: Home / Self Care | Payer: Medicare Other | Attending: Emergency Medicine | Admitting: Emergency Medicine

## 2021-03-23 DIAGNOSIS — R519 Headache, unspecified: Secondary | ICD-10-CM | POA: Diagnosis present

## 2021-03-23 DIAGNOSIS — I10 Essential (primary) hypertension: Secondary | ICD-10-CM | POA: Insufficient documentation

## 2021-03-23 DIAGNOSIS — Z7984 Long term (current) use of oral hypoglycemic drugs: Secondary | ICD-10-CM | POA: Insufficient documentation

## 2021-03-23 DIAGNOSIS — E039 Hypothyroidism, unspecified: Secondary | ICD-10-CM | POA: Insufficient documentation

## 2021-03-23 DIAGNOSIS — G43009 Migraine without aura, not intractable, without status migrainosus: Secondary | ICD-10-CM | POA: Insufficient documentation

## 2021-03-23 DIAGNOSIS — Z7982 Long term (current) use of aspirin: Secondary | ICD-10-CM | POA: Insufficient documentation

## 2021-03-23 DIAGNOSIS — Z91010 Allergy to peanuts: Secondary | ICD-10-CM | POA: Diagnosis not present

## 2021-03-23 DIAGNOSIS — Z79899 Other long term (current) drug therapy: Secondary | ICD-10-CM | POA: Insufficient documentation

## 2021-03-23 DIAGNOSIS — E119 Type 2 diabetes mellitus without complications: Secondary | ICD-10-CM | POA: Insufficient documentation

## 2021-03-23 MED ORDER — PROCHLORPERAZINE EDISYLATE 10 MG/2ML IJ SOLN
10.0000 mg | Freq: Once | INTRAMUSCULAR | Status: AC
  Administered 2021-03-23: 10 mg via INTRAVENOUS
  Filled 2021-03-23: qty 2

## 2021-03-23 MED ORDER — LOSARTAN POTASSIUM 50 MG PO TABS
50.0000 mg | ORAL_TABLET | Freq: Once | ORAL | Status: AC
  Administered 2021-03-23: 50 mg via ORAL
  Filled 2021-03-23: qty 1

## 2021-03-23 MED ORDER — DIPHENHYDRAMINE HCL 50 MG/ML IJ SOLN
25.0000 mg | Freq: Once | INTRAMUSCULAR | Status: AC
  Administered 2021-03-23: 25 mg via INTRAVENOUS
  Filled 2021-03-23: qty 1

## 2021-03-23 MED ORDER — DEXAMETHASONE SODIUM PHOSPHATE 10 MG/ML IJ SOLN
10.0000 mg | Freq: Once | INTRAMUSCULAR | Status: AC
  Administered 2021-03-23: 10 mg via INTRAMUSCULAR
  Filled 2021-03-23: qty 1

## 2021-03-23 MED ORDER — SODIUM CHLORIDE 0.9 % IV BOLUS
500.0000 mL | Freq: Once | INTRAVENOUS | Status: AC
  Administered 2021-03-23: 500 mL via INTRAVENOUS

## 2021-03-23 NOTE — ED Provider Notes (Signed)
Patient was given migraine cocktail.  Plan to reassess and anticipate discharge if symptoms much improved.  Patient had diagnostic work-up upon previous visit including CT angiograms and MRIs.  Patient reassessed and reports headache is very much improved since having the migraine cocktail. Physical Exam  BP (!) 184/84 (BP Location: Left Arm)   Pulse (!) 57   Temp (!) 97.5 F (36.4 C) (Oral)   Resp 16   SpO2 96%   Physical Exam  ED Course/Procedures     Procedures  MDM  Patient is alert and interactive.  Headache much improved.  Patient does have hypertension.  She takes Cozaar.  She has not had her morning dose.  Patient does have a long history of headaches.  Her husband however reports that headaches more recently seem to be starting at night.  I am considering patient is getting blood pressure elevations perhaps in the early hours of the morning.  Blood pressures here have been up to 180/80.  Morning Cozaar dose administered.  I am recommending at this time the add an evening 50 mg dose and closely monitor blood pressures.  Also, previous work-up showed some nonspecific MRI inflammatory changes for which patient was instructed to follow-up with Dr. Wilburn Cornelia.  Voice is clear no difficulty swallowing or breathing no indication of any upper airway compromise.  Patient reports her headache was better for several weeks after getting steroid therapy.  Will administer 1 shot of Decadron IM for suspected migraine and also based on prior recommendations regarding nonspecific MRI findings if this is contributory to patient's headache.       Charlesetta Shanks, MD 03/23/21 1019

## 2021-03-23 NOTE — Discharge Instructions (Signed)
1.  Your blood pressure was elevated in the emergency department.  Your blood pressures may be going up during the night and causing headache.  Start taking an evening dose of your Cozaar 50 mg.  Take your usual Cozaar 50 mg in the morning and add an evening 50 mg dose.  Monitor your blood pressures and write them down 3 or 4 times a day for the next week.  Review this with your family doctor. 2.  Call your neurologist today to schedule a follow-up appointment.  Since you are having a change in your headache patterns, you must have a recheck for migraines and headaches. 3.  Return to the emergency department if you have new, worsening or concerning symptoms. 4.  If you have not done so, follow-up with the ear nose throat specialist, Dr. Wilburn Cornelia as instructed in your previous discharge instructions.  Contact information is included in this discharge instruction set as well.

## 2021-03-23 NOTE — ED Provider Notes (Signed)
[facility] EMERGENCY DEPARTMENT Provider Note   CSN: [id number] Arrival date & time: 03/23/21  9628     History Chief Complaint  Patient presents with  . Headache    [name] is a 70 y.o. female.  Patient presents to the emergency department for headache.  She reports that she woke up with a headache today.  She does have a history of recurrent and frequent headaches.  Patient reports that she was seen in the ED earlier this month with an identical headache.  It resolved with treatment but came back today.  No numbness, tingling, weakness of extremities.  No neck pain or stiffness.  No fever.        Past Medical History:  Diagnosis Date  . Allergy   . Anxiety   . Arthritis   . Diabetes mellitus without complication ([place])    on metformin  . Hx of adenomatous colonic polyps 01/26/2015   and also 2021 - recall 2024  . Hyperlipidemia   . Hypertension   . Memory loss of unknown cause 03/04/2002   suspect depression with psychotic features. patient hospitalized and work-up negative.   . Panic attack    5-6 a day per pt and husband   . Seizures ([place])    per husband last seizure 3 yeras ago- documented 06-29-2020  . Thyroid disease    thyroid surgery when young    Patient Active Problem List   Diagnosis Date Noted  . Tension headache 03/01/2021  . Hypokalemia 09/21/2020  . Cough due to ACE inhibitor 06/16/2020  . 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 Diabetes mellitus ([place]) 04/21/2015  . Hx of adenomatous colonic polyps 01/26/2015  . Seizure ([place]) 02/23/2010  . Disturbance in sleep behavior 01/05/2010  . Depression 09/13/2009  . Anxiety state 09/03/2007  . Hypothyroidism 01/23/2007  . Hyperlipidemia 01/23/2007  . Cognitive impairment 01/23/2007  . HYPERTENSION, BENIGN SYSTEMIC 01/23/2007    Past Surgical History:  Procedure Laterality Date  . BUNIONECTOMY Bilateral   . COLONOSCOPY    . THYROIDECTOMY, PARTIAL    . TONSILLECTOMY       OB  History   No obstetric history on file.     Family History  Problem Relation Age of Onset  . Colon cancer Neg Hx   . Rectal cancer Neg Hx   . Stomach cancer Neg Hx   . Colon polyps Neg Hx   . Esophageal cancer Neg Hx     Social History   Tobacco Use  . Smoking status: Never Smoker  . Smokeless tobacco: Never Used  Vaping Use  . Vaping Use: Never used  Substance Use Topics  . Alcohol use: No    Alcohol/week: 0.0 standard drinks  . Drug use: No    Home Medications Prior to Admission medications   Medication Sig Start Date End Date Taking? Authorizing Provider  Acetaminophen-Caffeine (EXCEDRIN TENSION HEADACHE PO) Take 2 tablets by mouth daily as needed (headache).   Yes [provider]  aspirin EC 81 MG tablet Take 1 tablet (81 mg total) by mouth daily. 12/18/18  Yes Glenis Smoker, MD  atorvastatin (LIPITOR) 40 MG tablet TAKE ONE TABLET BY MOUTH DAILY AT 6 PM. Patient taking differently: Take 40 mg by mouth every evening. 02/27/21  Yes Meccariello, Bernita Raisin, DO  baclofen (LIORESAL) 10 MG tablet Take 1 tablet (10 mg total) by mouth 3 (three) times daily. 03/01/21  Yes Meccariello, Bernita Raisin, DO  citalopram (CELEXA) 20 MG tablet Take 1  tablet by mouth once daily Patient taking differently: Take 20 mg by mouth daily. 01/23/21  Yes Meccariello, Bernita Raisin, DO  cloBAZam (ONFI) 10 MG tablet Take 10 mg by mouth at bedtime.  02/05/20  Yes [provider]  clonazePAM (KLONOPIN) 0.5 MG tablet Take 1 tablet by mouth twice daily as needed for anxiety Patient taking differently: Take 0.5 mg by mouth 2 (two) times daily. 03/30/21  Yes Meccariello, Bernita Raisin, DO  EUTHYROX 75 MCG tablet TAKE ONE TABLET BY MOUTH DAILY FOR HYPOTHYROIDISM. Patient taking differently: Take 75 mcg by mouth daily before breakfast. 02/06/21  Yes Meccariello, Bernita Raisin, DO  hydrOXYzine (VISTARIL) 25 MG capsule TAKE 1 CAPSULE BY MOUTH TWICE DAILY AS NEEDED Patient taking differently: Take 25 mg by mouth 2  (two) times daily as needed for anxiety. 04/27/20  Yes Meccariello, Bernita Raisin, DO  ibuprofen (ADVIL) 200 MG tablet Take 400 mg by mouth every 6 (six) hours as needed for moderate pain or headache.   Yes [provider]  levETIRAcetam (KEPPRA) 500 MG tablet Take 3 tablets (1,500 mg total) by mouth 2 (two) times daily. 07/26/17 06/29/20 Yes Mercy Riding, MD  levocetirizine (XYZAL) 5 MG tablet Take 5 mg by mouth every evening. 01/05/15  Yes [provider]  losartan (COZAAR) 50 MG tablet TAKE 1 TABLET BY MOUTH AT BEDTIME Patient taking differently: Take 50 mg by mouth daily. 03/22/21  Yes Meccariello, Bernita Raisin, DO  meclizine (ANTIVERT) 12.5 MG tablet Take 12.5 mg by mouth 2 (two) times daily as needed for dizziness.   Yes [provider]  metFORMIN (GLUCOPHAGE) 500 MG tablet TAKE 1 TABLET BY MOUTH TWICE DAILY WITH A MEAL Patient taking differently: Take 500 mg by mouth 2 (two) times daily with a meal. 03/07/21  Yes Beard, Samantha N, DO  QUEtiapine (SEROQUEL) 50 MG tablet TAKE 1/2 (ONE-HALF) TABLET BY MOUTH AT BEDTIME Patient taking differently: Take 25 mg by mouth at bedtime. 12/06/20  Yes Meccariello, Bernita Raisin, DO  SUMAtriptan (IMITREX) 25 MG tablet Take 1 tablet (25 mg total) by mouth every 2 (two) hours as needed for migraine. May repeat in 2 hours if headache persists or recurs. 02/12/20  Yes Meccariello, Bernita Raisin, DO    Allergies    Chocolate, Coffee bean extract [coffea arabica], and Peanut-containing drug products  Review of Systems   Review of Systems  Eyes: Negative for visual disturbance.  Neurological: Positive for headaches.  All other systems reviewed and are negative.   Physical Exam Updated Vital Signs BP (!) 185/87 (BP Location: Left Arm)   Pulse 62   Temp (!) 97.5 F (36.4 C) (Oral)   Resp 16   SpO2 97%   Physical Exam Vitals and nursing note reviewed.  Constitutional:      General: She is not in acute distress.    Appearance: Normal appearance.  She is well-developed.  HENT:     Head: Normocephalic and atraumatic.     Right Ear: Hearing normal.     Left Ear: Hearing normal.     Nose: Nose normal.  Eyes:     Conjunctiva/sclera: Conjunctivae normal.     Pupils: Pupils are equal, round, and reactive to light.  Cardiovascular:     Rate and Rhythm: Regular rhythm.     Heart sounds: S1 normal and S2 normal. No murmur heard. No friction rub. No gallop.   Pulmonary:     Effort: Pulmonary effort is normal. No respiratory distress.     Breath sounds:  Normal breath sounds.  Chest:     Chest wall: No tenderness.  Abdominal:     General: Bowel sounds are normal.     Palpations: Abdomen is soft.     Tenderness: There is no abdominal tenderness. There is no guarding or rebound. Negative signs include Murphy's sign and McBurney's sign.     Hernia: No hernia is present.  Musculoskeletal:        General: Normal range of motion.     Cervical back: Normal range of motion and neck supple.  Skin:    General: Skin is warm and dry.     Findings: No rash.  Neurological:     Mental Status: She is alert and oriented to person, place, and time.     GCS: GCS eye subscore is 4. GCS verbal subscore is 5. GCS motor subscore is 6.     Cranial Nerves: No cranial nerve deficit.     Sensory: No sensory deficit.     Coordination: Coordination normal.  Psychiatric:        Speech: Speech normal.        Behavior: Behavior normal.        Thought Content: Thought content normal.     ED Results / Procedures / Treatments   Labs (all labs ordered are listed, but only abnormal results are displayed) Labs Reviewed - No data to display  EKG None  Radiology No results found.  Procedures Procedures   Medications Ordered in ED Medications  sodium chloride 0.9 % bolus 500 mL (500 mLs Intravenous New Bag/Given 03/23/21 0647)  prochlorperazine (COMPAZINE) injection 10 mg (10 mg Intravenous Given 03/23/21 0648)  diphenhydrAMINE (BENADRYL) injection 25 mg  (25 mg Intravenous Given 03/23/21 6283)    ED Course  I have reviewed the triage vital signs and the nursing notes.  Pertinent labs & imaging results that were available during my care of the patient were reviewed by me and considered in my medical decision making (see chart for details).    MDM Rules/Calculators/A&P                          Patient presents to the emergency department with recurrent headache.  She does have a history of migraines and recurrent headaches.  Headache today is identical to the headache she had when she was seen in the ER 2 weeks ago.  At that time she had CT angiography of head and neck that did not show any acute intracranial abnormality.  She has a normal neurologic exam today.  As she has had similar headaches in the past with thorough work-up and a normal neurologic exam, does not require work-up today.  She had resolution with Compazine and Benadryl previously.  Will administer these meds for treatment of migraine headache.  Final Clinical Impression(s) / ED Diagnoses Final diagnoses:  Migraine without aura and without status migrainosus, not intractable    Rx / DC Orders ED Discharge Orders    None       Rivaan Kendall, Gwenyth Allegra, MD 03/23/21 [phone]

## 2021-03-23 NOTE — ED Triage Notes (Signed)
Pt woke up w/ a bad headache, reports hx of same.  She admits she "can' remember things."  Poor historian, visitor will be in, moving car.  No other neuro symptoms at this time.

## 2021-04-07 ENCOUNTER — Other Ambulatory Visit: Payer: Self-pay | Admitting: Family Medicine

## 2021-04-07 DIAGNOSIS — E039 Hypothyroidism, unspecified: Secondary | ICD-10-CM

## 2021-04-08 ENCOUNTER — Observation Stay (HOSPITAL_COMMUNITY): Payer: Medicare Other

## 2021-04-08 ENCOUNTER — Emergency Department (HOSPITAL_COMMUNITY): Payer: Medicare Other

## 2021-04-08 ENCOUNTER — Other Ambulatory Visit: Payer: Self-pay

## 2021-04-08 ENCOUNTER — Observation Stay (HOSPITAL_COMMUNITY)
Admission: EM | Admit: 2021-04-08 | Discharge: 2021-04-09 | Disposition: A | Discharge status: Home / Self Care | Payer: Medicare Other | Attending: Family Medicine | Admitting: Family Medicine

## 2021-04-08 DIAGNOSIS — Z20822 Contact with and (suspected) exposure to covid-19: Secondary | ICD-10-CM | POA: Diagnosis not present

## 2021-04-08 DIAGNOSIS — Z7982 Long term (current) use of aspirin: Secondary | ICD-10-CM | POA: Diagnosis not present

## 2021-04-08 DIAGNOSIS — E039 Hypothyroidism, unspecified: Secondary | ICD-10-CM | POA: Insufficient documentation

## 2021-04-08 DIAGNOSIS — Z7984 Long term (current) use of oral hypoglycemic drugs: Secondary | ICD-10-CM | POA: Diagnosis not present

## 2021-04-08 DIAGNOSIS — E119 Type 2 diabetes mellitus without complications: Secondary | ICD-10-CM | POA: Diagnosis not present

## 2021-04-08 DIAGNOSIS — I63531 Cerebral infarction due to unspecified occlusion or stenosis of right posterior cerebral artery: Secondary | ICD-10-CM | POA: Diagnosis not present

## 2021-04-08 DIAGNOSIS — G459 Transient cerebral ischemic attack, unspecified: Principal | ICD-10-CM | POA: Insufficient documentation

## 2021-04-08 DIAGNOSIS — Y900 Blood alcohol level of less than 20 mg/100 ml: Secondary | ICD-10-CM | POA: Insufficient documentation

## 2021-04-08 DIAGNOSIS — I639 Cerebral infarction, unspecified: Secondary | ICD-10-CM

## 2021-04-08 DIAGNOSIS — I10 Essential (primary) hypertension: Secondary | ICD-10-CM | POA: Diagnosis not present

## 2021-04-08 DIAGNOSIS — Z79899 Other long term (current) drug therapy: Secondary | ICD-10-CM | POA: Diagnosis not present

## 2021-04-08 DIAGNOSIS — Z91010 Allergy to peanuts: Secondary | ICD-10-CM | POA: Diagnosis not present

## 2021-04-08 DIAGNOSIS — R4781 Slurred speech: Secondary | ICD-10-CM | POA: Diagnosis present

## 2021-04-08 HISTORY — DX: Transient cerebral ischemic attack, unspecified: G45.9

## 2021-04-08 HISTORY — DX: Cerebral infarction, unspecified: I63.9

## 2021-04-08 LAB — URINALYSIS, ROUTINE W REFLEX MICROSCOPIC
Bilirubin Urine: NEGATIVE
Glucose, UA: NEGATIVE mg/dL
Hgb urine dipstick: NEGATIVE
Ketones, ur: NEGATIVE mg/dL
Leukocytes,Ua: NEGATIVE
Nitrite: NEGATIVE
Protein, ur: NEGATIVE mg/dL
Specific Gravity, Urine: 1.01 (ref 1.005–1.030)
pH: 7 (ref 5.0–8.0)

## 2021-04-08 LAB — RAPID URINE DRUG SCREEN, HOSP PERFORMED
Amphetamines: NOT DETECTED
Barbiturates: NOT DETECTED
Benzodiazepines: NOT DETECTED
Cocaine: NOT DETECTED
Opiates: NOT DETECTED
Tetrahydrocannabinol: NOT DETECTED

## 2021-04-08 LAB — COMPREHENSIVE METABOLIC PANEL
ALT: 19 U/L (ref 0–44)
AST: 21 U/L (ref 15–41)
Albumin: 3.7 g/dL (ref 3.5–5.0)
Alkaline Phosphatase: 57 U/L (ref 38–126)
Anion gap: 8 (ref 5–15)
BUN: 7 mg/dL — ABNORMAL LOW (ref 8–23)
CO2: 26 mmol/L (ref 22–32)
Calcium: 8.9 mg/dL (ref 8.9–10.3)
Chloride: 106 mmol/L (ref 98–111)
Creatinine, Ser: 0.71 mg/dL (ref 0.44–1.00)
GFR, Estimated: >60 mL/min (ref >60)
Glucose, Bld: 95 mg/dL (ref 70–99)
Potassium: 3.8 mmol/L (ref 3.5–5.1)
Sodium: 140 mmol/L (ref 135–145)
Total Bilirubin: 0.5 mg/dL (ref 0.3–1.2)
Total Protein: 6.8 g/dL (ref 6.5–8.1)

## 2021-04-08 LAB — DIFFERENTIAL
Abs Immature Granulocytes: 0 10*3/uL (ref 0.00–0.07)
Basophils Absolute: 0 10*3/uL (ref 0.0–0.1)
Basophils Relative: 1 %
Eosinophils Absolute: 0.1 10*3/uL (ref 0.0–0.5)
Eosinophils Relative: 2 %
Immature Granulocytes: 0 %
Lymphocytes Relative: 42 %
Lymphs Abs: 1.8 10*3/uL (ref 0.7–4.0)
Monocytes Absolute: 0.3 10*3/uL (ref 0.1–1.0)
Monocytes Relative: 7 %
Neutro Abs: 2 10*3/uL (ref 1.7–7.7)
Neutrophils Relative %: 48 %

## 2021-04-08 LAB — APTT: aPTT: 27 seconds (ref 24–36)

## 2021-04-08 LAB — CBC
HCT: 34.8 % — ABNORMAL LOW (ref 36.0–46.0)
Hemoglobin: 11.3 g/dL — ABNORMAL LOW (ref 12.0–15.0)
MCH: 31.4 pg (ref 26.0–34.0)
MCHC: 32.5 g/dL (ref 30.0–36.0)
MCV: 96.7 fL (ref 80.0–100.0)
Platelets: 257 10*3/uL (ref 150–400)
RBC: 3.6 MIL/uL — ABNORMAL LOW (ref 3.87–5.11)
RDW: 14.6 % (ref 11.5–15.5)
WBC: 4.3 10*3/uL (ref 4.0–10.5)
nRBC: 0 % (ref 0.0–0.2)

## 2021-04-08 LAB — CBG MONITORING, ED: Glucose-Capillary: 92 mg/dL (ref 70–99)

## 2021-04-08 LAB — RESP PANEL BY RT-PCR (FLU A&B, COVID) ARPGX2
Influenza A by PCR: NEGATIVE
Influenza B by PCR: NEGATIVE
SARS Coronavirus 2 by RT PCR: NEGATIVE

## 2021-04-08 LAB — PROTIME-INR
INR: 1 (ref 0.8–1.2)
Prothrombin Time: 12.8 seconds (ref 11.4–15.2)

## 2021-04-08 LAB — ETHANOL: Alcohol, Ethyl (B): <10 mg/dL (ref <10)

## 2021-04-08 MED ORDER — SODIUM CHLORIDE 0.9 % IV SOLN
100.0000 mL/h | INTRAVENOUS | Status: DC
  Administered 2021-04-08 (×2): 100 mL/h via INTRAVENOUS

## 2021-04-08 MED ORDER — CLOBAZAM 10 MG PO TABS: 10.0000 mg | ORAL_TABLET | Freq: Every day | ORAL | Status: DC

## 2021-04-08 MED ORDER — CLOPIDOGREL BISULFATE 75 MG PO TABS
75.0000 mg | ORAL_TABLET | Freq: Every day | ORAL | Status: DC
  Administered 2021-04-08 – 2021-04-09 (×2): 75 mg via ORAL
  Filled 2021-04-08 (×2): qty 1

## 2021-04-08 MED ORDER — HYDROXYZINE PAMOATE 25 MG PO CAPS
25.0000 mg | ORAL_CAPSULE | Freq: Two times a day (BID) | ORAL | Status: DC | PRN
  Filled 2021-04-08: qty 1

## 2021-04-08 MED ORDER — CITALOPRAM HYDROBROMIDE 20 MG PO TABS
20.0000 mg | ORAL_TABLET | Freq: Every day | ORAL | Status: DC
  Administered 2021-04-09: 20 mg via ORAL
  Filled 2021-04-08: qty 2

## 2021-04-08 MED ORDER — LEVETIRACETAM 500 MG PO TABS
1500.0000 mg | ORAL_TABLET | Freq: Two times a day (BID) | ORAL | Status: DC
  Administered 2021-04-08 – 2021-04-09 (×2): 1500 mg via ORAL
  Filled 2021-04-08 (×2): qty 3

## 2021-04-08 MED ORDER — INSULIN ASPART 100 UNIT/ML IJ SOLN: 0.0000 [IU] | Freq: Three times a day (TID) | INTRAMUSCULAR | Status: DC

## 2021-04-08 MED ORDER — ASPIRIN EC 81 MG PO TBEC
81.0000 mg | DELAYED_RELEASE_TABLET | Freq: Every day | ORAL | Status: DC
  Administered 2021-04-09: 81 mg via ORAL
  Filled 2021-04-08: qty 1

## 2021-04-08 MED ORDER — QUETIAPINE FUMARATE 25 MG PO TABS
25.0000 mg | ORAL_TABLET | Freq: Every day | ORAL | Status: DC
  Administered 2021-04-08: 25 mg via ORAL
  Filled 2021-04-08 (×2): qty 1

## 2021-04-08 MED ORDER — ACETAMINOPHEN 650 MG RE SUPP: 650.0000 mg | RECTAL | Status: DC | PRN

## 2021-04-08 MED ORDER — ATORVASTATIN CALCIUM 40 MG PO TABS
40.0000 mg | ORAL_TABLET | Freq: Every evening | ORAL | Status: DC
  Administered 2021-04-08 – 2021-04-09 (×2): 40 mg via ORAL
  Filled 2021-04-08 (×2): qty 1

## 2021-04-08 MED ORDER — SODIUM CHLORIDE 0.9 % IV BOLUS
500.0000 mL | Freq: Once | INTRAVENOUS | Status: AC
  Administered 2021-04-08: 500 mL via INTRAVENOUS

## 2021-04-08 MED ORDER — LEVOTHYROXINE SODIUM 75 MCG PO TABS
75.0000 ug | ORAL_TABLET | Freq: Every day | ORAL | Status: DC
  Administered 2021-04-09: 75 ug via ORAL
  Filled 2021-04-08: qty 1

## 2021-04-08 MED ORDER — STROKE: EARLY STAGES OF RECOVERY BOOK: Freq: Once | Status: DC

## 2021-04-08 MED ORDER — ENOXAPARIN SODIUM 40 MG/0.4ML IJ SOSY
40.0000 mg | PREFILLED_SYRINGE | INTRAMUSCULAR | Status: DC
  Administered 2021-04-08: 40 mg via SUBCUTANEOUS
  Filled 2021-04-08: qty 0.4

## 2021-04-08 MED ORDER — ACETAMINOPHEN 325 MG PO TABS: 650.0000 mg | ORAL_TABLET | ORAL | Status: DC | PRN

## 2021-04-08 MED ORDER — ACETAMINOPHEN 500 MG PO TABS
1000.0000 mg | ORAL_TABLET | Freq: Once | ORAL | Status: AC
  Administered 2021-04-08: 1000 mg via ORAL
  Filled 2021-04-08: qty 2

## 2021-04-08 MED ORDER — ACETAMINOPHEN 160 MG/5ML PO SOLN: 650.0000 mg | ORAL | Status: DC | PRN

## 2021-04-08 MED ORDER — HYDROXYZINE HCL 25 MG PO TABS: 25.0000 mg | ORAL_TABLET | Freq: Two times a day (BID) | ORAL | Status: DC | PRN

## 2021-04-08 MED ORDER — CLOBAZAM 10 MG PO TABS
10.0000 mg | ORAL_TABLET | Freq: Every day | ORAL | Status: DC
  Administered 2021-04-08: 10 mg via ORAL
  Filled 2021-04-08: qty 1

## 2021-04-08 MED ORDER — CLONAZEPAM 0.5 MG PO TABS: 0.5000 mg | ORAL_TABLET | Freq: Two times a day (BID) | ORAL | Status: DC | PRN

## 2021-04-08 NOTE — ED Provider Notes (Signed)
[place] [facility] EMERGENCY DEPARTMENT Provider Note   CSN: [id number] Arrival date & time: 04/08/21  1506     History Chief Complaint  Patient presents with  . Transient Ischemic Attack    [name] is a 70 y.o. female.  Pt presents to the ED today with a possible TIA.  Pt noticed she had slurred speech around 1400.  The pt told her family who also noticed some left arm and leg weakness.  The family called EMS.  The first responders (firemen) saw the weakness; however, by the time EMS arrived at 1430, the weakness was gone.  It was associated with a headache.  Pt said the headache was not nearly as bad as when she was here in April for a migraine.  Pt feels back to normal now.        Past Medical History:  Diagnosis Date  . Allergy   . Anxiety   . Arthritis   . Diabetes mellitus without complication ([place])    on metformin  . Hx of adenomatous colonic polyps 01/26/2015   and also 2021 - recall 2024  . Hyperlipidemia   . Hypertension   . Memory loss of unknown cause 03/04/2002   suspect depression with psychotic features. patient hospitalized and work-up negative.   . Panic attack    5-6 a day per pt and husband   . Seizures ([place])    per husband last seizure 3 yeras ago- documented 06-29-2020  . Thyroid disease    thyroid surgery when young    Patient Active Problem List   Diagnosis Date Noted  . Tension headache 03/01/2021  . Hypokalemia 09/21/2020  . Cough due to ACE inhibitor 06/16/2020  . 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 Diabetes mellitus ([place]) 04/21/2015  . Hx of adenomatous colonic polyps 01/26/2015  . Seizure ([place]) 02/23/2010  . Disturbance in sleep behavior 01/05/2010  . Depression 09/13/2009  . Anxiety state 09/03/2007  . Hypothyroidism 01/23/2007  . Hyperlipidemia 01/23/2007  . Cognitive impairment 01/23/2007  . HYPERTENSION, BENIGN SYSTEMIC 01/23/2007    Past Surgical History:  Procedure Laterality Date  . BUNIONECTOMY Bilateral    . COLONOSCOPY    . THYROIDECTOMY, PARTIAL    . TONSILLECTOMY       OB History   No obstetric history on file.     Family History  Problem Relation Age of Onset  . Colon cancer Neg Hx   . Rectal cancer Neg Hx   . Stomach cancer Neg Hx   . Colon polyps Neg Hx   . Esophageal cancer Neg Hx     Social History   Tobacco Use  . Smoking status: Never Smoker  . Smokeless tobacco: Never Used  Vaping Use  . Vaping Use: Never used  Substance Use Topics  . Alcohol use: No    Alcohol/week: 0.0 standard drinks  . Drug use: No    Home Medications Prior to Admission medications   Medication Sig Start Date End Date Taking? Authorizing Provider  Acetaminophen-Caffeine (EXCEDRIN TENSION HEADACHE PO) Take 2 tablets by mouth daily as needed (headache).    [provider]  aspirin EC 81 MG tablet Take 1 tablet (81 mg total) by mouth daily. 12/18/18   Glenis Smoker, MD  atorvastatin (LIPITOR) 40 MG tablet TAKE ONE TABLET BY MOUTH DAILY AT 6 PM. Patient taking differently: Take 40 mg by mouth every evening. 02/27/21   Meccariello, Bernita Raisin, DO  baclofen (LIORESAL) 10 MG tablet Take 1 tablet (10  mg total) by mouth 3 (three) times daily. 03/01/21   Meccariello, Bernita Raisin, DO  citalopram (CELEXA) 20 MG tablet Take 1 tablet by mouth once daily Patient taking differently: Take 20 mg by mouth daily. 01/23/21   Meccariello, Bernita Raisin, DO  cloBAZam (ONFI) 10 MG tablet Take 10 mg by mouth at bedtime.  02/05/20   [provider]  clonazePAM (KLONOPIN) 0.5 MG tablet Take 1 tablet by mouth twice daily as needed for anxiety Patient taking differently: Take 0.5 mg by mouth 2 (two) times daily. 03/30/21   Meccariello, Bernita Raisin, DO  hydrOXYzine (VISTARIL) 25 MG capsule TAKE 1 CAPSULE BY MOUTH TWICE DAILY AS NEEDED Patient taking differently: Take 25 mg by mouth 2 (two) times daily as needed for anxiety. 04/27/20   Meccariello, Bernita Raisin, DO  ibuprofen (ADVIL) 200 MG tablet Take 400 mg by mouth  every 6 (six) hours as needed for moderate pain or headache.    [provider]  levETIRAcetam (KEPPRA) 500 MG tablet Take 3 tablets (1,500 mg total) by mouth 2 (two) times daily. 07/26/17 06/29/20  Mercy Riding, MD  levocetirizine (XYZAL) 5 MG tablet Take 5 mg by mouth every evening. 01/05/15   [provider]  levothyroxine (EUTHYROX) 75 MCG tablet Take 1 tablet (75 mcg total) by mouth daily before breakfast. 04/07/21   Meccariello, Bernita Raisin, DO  losartan (COZAAR) 50 MG tablet TAKE 1 TABLET BY MOUTH AT BEDTIME Patient taking differently: Take 50 mg by mouth daily. 03/22/21   Meccariello, Bernita Raisin, DO  meclizine (ANTIVERT) 12.5 MG tablet Take 12.5 mg by mouth 2 (two) times daily as needed for dizziness.    [provider]  metFORMIN (GLUCOPHAGE) 500 MG tablet TAKE 1 TABLET BY MOUTH TWICE DAILY WITH A MEAL Patient taking differently: Take 500 mg by mouth 2 (two) times daily with a meal. 03/07/21   Beard, Ralph Leyden, DO  QUEtiapine (SEROQUEL) 50 MG tablet TAKE 1/2 (ONE-HALF) TABLET BY MOUTH AT BEDTIME Patient taking differently: Take 25 mg by mouth at bedtime. 12/06/20   Meccariello, Bernita Raisin, DO  SUMAtriptan (IMITREX) 25 MG tablet Take 1 tablet (25 mg total) by mouth every 2 (two) hours as needed for migraine. May repeat in 2 hours if headache persists or recurs. 02/12/20   Meccariello, Bernita Raisin, DO    Allergies    Chocolate, Coffee bean extract [coffea arabica], and Peanut-containing drug products  Review of Systems   Review of Systems  Neurological: Positive for weakness and headaches.  All other systems reviewed and are negative.   Physical Exam Updated Vital Signs BP (!) 200/96   Pulse 74   Temp (!) 97.5 F (36.4 C) (Temporal)   Resp 13   Ht 5\' 3"  (1.6 m)   Wt 68 kg   SpO2 99%   BMI 26.57 kg/m   Physical Exam Vitals and nursing note reviewed.  Constitutional:      Appearance: Normal appearance.  HENT:     Head: Normocephalic and atraumatic.     Right  Ear: External ear normal.     Left Ear: External ear normal.     Nose: Nose normal.     Mouth/Throat:     Mouth: Mucous membranes are moist.     Pharynx: Oropharynx is clear.  Eyes:     Extraocular Movements: Extraocular movements intact.     Conjunctiva/sclera: Conjunctivae normal.     Pupils: Pupils are equal, round, and reactive to light.  Cardiovascular:  Rate and Rhythm: Normal rate and regular rhythm.     Pulses: Normal pulses.     Heart sounds: Normal heart sounds.  Pulmonary:     Effort: Pulmonary effort is normal.     Breath sounds: Normal breath sounds.  Abdominal:     General: Abdomen is flat. Bowel sounds are normal.     Palpations: Abdomen is soft.  Musculoskeletal:        General: Normal range of motion.     Cervical back: Normal range of motion and neck supple.  Skin:    General: Skin is warm.     Capillary Refill: Capillary refill takes less than 2 seconds.  Neurological:     General: No focal deficit present.     Mental Status: She is alert and oriented to person, place, and time.  Psychiatric:        Mood and Affect: Mood normal.        Behavior: Behavior normal.        Thought Content: Thought content normal.        Judgment: Judgment normal.     ED Results / Procedures / Treatments   Labs (all labs ordered are listed, but only abnormal results are displayed) Labs Reviewed  CBC - Abnormal; Notable for the following components:      Result Value   RBC 3.60 (*)    Hemoglobin 11.3 (*)    HCT 34.8 (*)    All other components within normal limits  COMPREHENSIVE METABOLIC PANEL - Abnormal; Notable for the following components:   BUN 7 (*)    All other components within normal limits  RESP PANEL BY RT-PCR (FLU A&B, COVID) ARPGX2  ETHANOL  PROTIME-INR  APTT  DIFFERENTIAL  RAPID URINE DRUG SCREEN, HOSP PERFORMED  URINALYSIS, ROUTINE W REFLEX MICROSCOPIC  HEMOGLOBIN A1C  LIPID PANEL    EKG EKG Interpretation  Date/Time:  Saturday Apr 08 2021  15:17:41 EDT Ventricular Rate:  64 PR Interval:  129 QRS Duration: 84 QT Interval:  414 QTC Calculation: 428 R Axis:   -31 Text Interpretation: Sinus rhythm Probable left ventricular hypertrophy Nonspecific T abnormalities, anterior leads No significant change since last tracing Confirmed by [name] [id number]) on 04/08/2021 3:20:00 PM   Radiology CT HEAD WO CONTRAST  Result Date: 04/08/2021 CLINICAL DATA:  70 year old presenting with acute onset of headache and dizziness. Acute onset of slurred speech, LEFT UPPER EXTREMITY weakness and LEFT-sided facial droop that was initially noticed by the family at 2 o'clock p.m. today. Current history of hypertension. EXAM: CT HEAD WITHOUT CONTRAST TECHNIQUE: Contiguous axial images were obtained from the base of the skull through the vertex without intravenous contrast. COMPARISON:  03/07/2021 and earlier.  MRI brain 12/19/2009. FINDINGS: Brain: Geographic low attenuation involving the RIGHT posterior parietal lobe and RIGHT occipital lobe, new since the CT 1 month ago; the low-attenuation is accentuated by the beam hardening streak artifact from the patient's hair clips which could not be removed. No associated mass effect or midline shift currently. No associated hemorrhage or hematoma. No extra-axial fluid collections. No focal brain parenchymal abnormality elsewhere. Vascular: Mild BILATERAL carotid siphon atherosclerosis and mild LEFT vertebral artery atherosclerosis. No visible hyperdense vessel. Skull: No skull fracture or other focal osseous abnormality involving the skull. Sinuses/Orbits: Visualized paranasal sinuses, bilateral mastoid air cells and bilateral middle ear cavities well-aerated. Visualized orbits and globes normal in appearance. Other: None. IMPRESSION: 1. Likely acute nonhemorrhagic stroke involving the RIGHT posterior parietal lobe and RIGHT occipital lobe.  2. No mass effect or midline shift currently. I personally telephoned these  results at the time of interpretation on 04/08/2021 at 4:05 pm to provider [name], MD, who verbally acknowledged these results. Electronically Signed   By: Evangeline Dakin M.D.   On: 04/08/2021 16:09    Procedures Procedures   Medications Ordered in ED Medications  sodium chloride 0.9 % bolus 500 mL (0 mLs Intravenous Stopped 04/08/21 1702)    Followed by  0.9 %  sodium chloride infusion (100 mL/hr Intravenous New Bag/Given 04/08/21 1703)  clopidogrel (PLAVIX) tablet 75 mg (has no administration in time range)    ED Course  I have reviewed the triage vital signs and the nursing notes.  Pertinent labs & imaging results that were available during my care of the patient were reviewed by me and considered in my medical decision making (see chart for details).    MDM Rules/Calculators/A&P                          Pt does have an acute stroke.  Pt d/w Dr. Lorrin Goodell (neurology) who did a consult.  As sx have resolved, code stroke has not been called and tpa is not warranted.  He recommends admission for stroke work up.  I spoke with the [place] residents who will admit.  CRITICAL CARE Performed by: [name]   Total critical care time: 30 minutes  Critical care time was exclusive of separately billable procedures and treating other patients.  Critical care was necessary to treat or prevent imminent or life-threatening deterioration.  Critical care was time spent personally by me on the following activities: development of treatment plan with patient and/or surrogate as well as nursing, discussions with consultants, evaluation of patient's response to treatment, examination of patient, obtaining history from patient or surrogate, ordering and performing treatments and interventions, ordering and review of laboratory studies, ordering and review of radiographic studies, pulse oximetry and re-evaluation of patient's condition.  Final Clinical Impression(s) / ED Diagnoses Final  diagnoses:  Cerebrovascular accident (CVA), unspecified mechanism ([place])    Rx / [place] Orders ED Discharge Orders    None       [name], MD 04/08/21 1725

## 2021-04-08 NOTE — ED Notes (Signed)
Secretary paging provider for me to discuss with them if pt can eat, something for the headache, and about the pt bp

## 2021-04-08 NOTE — ED Notes (Signed)
Pt had purewick in place however her sheets and panies got wet. Pt was cleaned and changed at this time and adjusted in bed.

## 2021-04-08 NOTE — ED Notes (Signed)
Received verbal report from [name]

## 2021-04-08 NOTE — H&P (Addendum)
[facility] Admission History and Physical Service Pager: [phone]  Patient name: [name] Medical record number: [id number] Date of birth: 09-04-1951 Age: 70 y.o. Gender: female  Primary Care Provider: Cleophas Dunker, DO Consultants: Neurology Code Status: Full code  Preferred Emergency Contact: Glorya Candell - husband - [phone]  Chief Complaint: Left facial droop  Assessment and Plan: [name] is a 70 y.o. female presenting with slurred speech and left sided weakness. PMH is significant for hypertension, type 2 diabetes, thyroid disease s/p thyroidectomy, hyperlipidemia, seizures, migraines, anxiety.   TIA  Patient presenting with new onset on slurred speech and left sided numbness with a question of weakness. Symptoms resolved within 41mins of onset. On presentation there is no facial droop or focal deficit. CT Head read remarkable for likely acute nonhemorrhagic stroke involving the RIGHT posterior parietal lobe and RIGHT occipital lobe. Neurology consulted in the ED. MRI with no acute finding but did show a 1.2 x 0.6 dural based mass, likely meningioma present.  I did speak with radiology on the phone and clarified the inconsistency between the initial CT read and the MRI read.  The radiologist was kind enough to pull up the 2 images and stated that there was a lot of artifact in the CT image in that region but that the MRI did not show any acute infarct.  Most likely etiology for patient's slurred speech and left-sided numbness which resolved within approximately 30 minutes would be a TIA.  Admitting patient for close monitoring and further work-up. -Admit to progressive - Neurology following, appreciate recommendations  - f/u Carotid Dopplers -TTE -Lipid, HbA1c, TSH  -DAPT x21 days then ASA monotherapy -Continue  statin, goal LDL <70 -Permissive HTN x 24hrs <220/110 -Hold BP meds -NPO until bedside  swallow -PT/OT/SLP -BMP, CBC  HTN BP at admission was 206/88. We will hold home medications in setting of acute stroke for permissive hypertension BP at admission was 206/88. Home medications Losartan 50 mg  -Allow for permissive HTN x 24 hrs per Neuro with goal of [id number] systolic and [id number] diastolic  -Hold home meds  DM Type 2 Home meds Metformin 500 mg BID. Last HbA1C in 01/22 was 7.0.  -Hold Metformin -Sensitive sliding scale -A1c pending  Seizures Home medication Keppra 1500 mg BID.  Compliant with home medications -EEG -Neuro following appreciate recommendations -Continue home keppra  Thyroid disease s/p thyroidectomy Home medication Levothyroxine 75 mcg.  Last TSH 3.33 (06/21) -TSH -Continue home medication  Tension style headache with history of migraines Home medication acetaminophen and Excedrin Migraine as needed.  Patient dorsals a history of migraines for many years.  Today she does states she has a tension style headache with no photophobia, no nausea and a "viselike feel".  She does have a lot of tension in her right shoulder and when shrugging the right shoulder it seems to make her headache worse on the right suggesting further tension style headache. -We will give 1000 mg acetaminophen -Hold Excedrin Migraine -We will give heating pad for right shoulder -Continue to monitor  Anxiety Seroquel 25mg  qhs, Atarax  25 mg BID prn, Clonazepam 0.5mg  BID prn  FEN/GI: NPO Prophylaxis: Lovenox  Disposition: Progressive  History of Present Illness:  [name] is a 70 y.o. female presenting with facial droop and left-sided numbness  Daughter states that earlier today around 230 the patient had some numbness and questionable weakness in her left side side and left sided facial droop. The daughter called  [phone] and by the time they arrived her facial droop had mostly resolved. She was able to walk when EMS arrived.   She has a history of headaches and does have one  currently.  She states this headache is right-sided and is "vice like".  She states it feels different than her normal migraines.  She denies any photophobia or nausea associated with it.  She typically takes Excedrin Migraine and Tylenol at home.  She also notes some tension in her right trapezius and shrugging her right shoulder does seem to worsen her right-sided headache.  Patient denies any alcohol, never smoker, denies ever using illicit drugs.  ED course: Code Stroke iniated. CT w/o Head consistent for a likely acute nonhemorrhagic stroke involving the RIGHT posterior parietal lobe and RIGHT occipital lobe. No mass effect or midline shift currently. CBC was significant for Hbg 11.3, WBC wnl. CMP wnl. Coag studies were unremarkable. MRI Head showed no acute finding but did show a incidental finding of 1.2 x 0.6 dural based mass, likely meningioma.  Review Of Systems: Per HPI with the following additions:   Review of Systems  Constitutional: Negative for fever.  HENT: Positive for congestion.   Respiratory: Negative for cough and shortness of breath.   Cardiovascular: Negative for chest pain.  Gastrointestinal: Negative for abdominal pain.  Genitourinary: Negative for dysuria.  Neurological: Positive for headaches. Negative for weakness and numbness.     Patient Active Problem List   Diagnosis Date Noted  . CVA (cerebral vascular accident) ([place]) 04/08/2021  . TIA (transient ischemic attack) 04/08/2021  . Tension headache 03/01/2021  . Hypokalemia 09/21/2020  . Cough due to ACE inhibitor 06/16/2020  . 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 Diabetes mellitus ([place]) 04/21/2015  . Hx of adenomatous colonic polyps 01/26/2015  . Seizure ([place]) 02/23/2010  . Disturbance in sleep behavior 01/05/2010  . Depression 09/13/2009  . Anxiety state 09/03/2007  . Hypothyroidism 01/23/2007  . Hyperlipidemia 01/23/2007  . Cognitive impairment 01/23/2007  . HYPERTENSION, BENIGN SYSTEMIC 01/23/2007     Past Medical History: Past Medical History:  Diagnosis Date  . Allergy   . Anxiety   . Arthritis   . Diabetes mellitus without complication ([place])    on metformin  . Hx of adenomatous colonic polyps 01/26/2015   and also 2021 - recall 2024  . Hyperlipidemia   . Hypertension   . Memory loss of unknown cause 03/04/2002   suspect depression with psychotic features. patient hospitalized and work-up negative.   . Panic attack    5-6 a day per pt and husband   . Seizures ([place])    per husband last seizure 3 yeras ago- documented 06-29-2020  . Thyroid disease    thyroid surgery when young    Past Surgical History: Past Surgical History:  Procedure Laterality Date  . BUNIONECTOMY Bilateral   . COLONOSCOPY    . THYROIDECTOMY, PARTIAL    . TONSILLECTOMY      Social History: Social History   Tobacco Use  . Smoking status: Never Smoker  . Smokeless tobacco: Never Used  Vaping Use  . Vaping Use: Never used  Substance Use Topics  . Alcohol use: No    Alcohol/week: 0.0 standard drinks  . Drug use: No   Additional social history:   Please also refer to relevant sections of EMR.  Family History: Family History  Problem Relation Age of Onset  . Colon cancer Neg Hx   . Rectal cancer Neg Hx   . Stomach cancer  Neg Hx   . Colon polyps Neg Hx   . Esophageal cancer Neg Hx     Allergies and Medications: Allergies  Allergen Reactions  . Chocolate Hives  . Coffee Bean Extract [Coffea Arabica] Rash  . Peanut-Containing Drug Products Rash    Possible allergy   No current facility-administered medications on file prior to encounter.   Current Outpatient Medications on File Prior to Encounter  Medication Sig Dispense Refill  . Acetaminophen-Caffeine (EXCEDRIN TENSION HEADACHE PO) Take 2 tablets by mouth daily as needed (headache).    Marland Kitchen aspirin EC 81 MG tablet Take 1 tablet (81 mg total) by mouth daily. 90 tablet 0  . atorvastatin (LIPITOR) 40 MG tablet TAKE ONE TABLET BY MOUTH  DAILY AT 6 PM. (Patient taking differently: Take 40 mg by mouth every evening.) 90 tablet 0  . baclofen (LIORESAL) 10 MG tablet Take 1 tablet (10 mg total) by mouth 3 (three) times daily. 30 each 0  . citalopram (CELEXA) 20 MG tablet Take 1 tablet by mouth once daily (Patient taking differently: Take 20 mg by mouth daily.) 90 tablet 0  . cloBAZam (ONFI) 10 MG tablet Take 10 mg by mouth at bedtime.     . clonazePAM (KLONOPIN) 0.5 MG tablet Take 1 tablet by mouth twice daily as needed for anxiety (Patient taking differently: Take 0.5 mg by mouth 2 (two) times daily.) 60 tablet 0  . hydrOXYzine (VISTARIL) 25 MG capsule TAKE 1 CAPSULE BY MOUTH TWICE DAILY AS NEEDED (Patient taking differently: Take 25 mg by mouth 2 (two) times daily as needed for anxiety.) 60 capsule 0  . ibuprofen (ADVIL) 200 MG tablet Take 400 mg by mouth every 6 (six) hours as needed for moderate pain or headache.    . levETIRAcetam (KEPPRA) 500 MG tablet Take 3 tablets (1,500 mg total) by mouth 2 (two) times daily. 180 tablet 11  . levocetirizine (XYZAL) 5 MG tablet Take 5 mg by mouth every evening.    Marland Kitchen levothyroxine (EUTHYROX) 75 MCG tablet Take 1 tablet (75 mcg total) by mouth daily before breakfast. 90 tablet 1  . losartan (COZAAR) 50 MG tablet TAKE 1 TABLET BY MOUTH AT BEDTIME (Patient taking differently: Take 50 mg by mouth daily.) 90 tablet 0  . meclizine (ANTIVERT) 12.5 MG tablet Take 12.5 mg by mouth 2 (two) times daily as needed for dizziness.    . metFORMIN (GLUCOPHAGE) 500 MG tablet TAKE 1 TABLET BY MOUTH TWICE DAILY WITH A MEAL (Patient taking differently: Take 500 mg by mouth 2 (two) times daily with a meal.) 180 tablet 0  . QUEtiapine (SEROQUEL) 50 MG tablet TAKE 1/2 (ONE-HALF) TABLET BY MOUTH AT BEDTIME (Patient taking differently: Take 25 mg by mouth at bedtime.) 90 tablet 0  . SUMAtriptan (IMITREX) 25 MG tablet Take 1 tablet (25 mg total) by mouth every 2 (two) hours as needed for migraine. May repeat in 2 hours if  headache persists or recurs. 9 tablet 0    Objective: BP (!) 186/101   Pulse 61   Temp 97.8 F (36.6 C) (Oral)   Resp 17   Ht 5\' 3"  (1.6 m)   Wt 68 kg   SpO2 98%   BMI 26.57 kg/m  Exam: General: Alert and oriented to person and place, is unable to state the year but states that it is May, no apparent distress  Eyes: PERRLA, no photophobia ENTM: No pharyengeal erythema Cardiovascular: RRR with no murmurs noted Respiratory: CTA bilaterally  Gastrointestinal: Bowel sounds present.  No abdominal pain MSK: Upper extremity strength 5/5 bilaterally, Lower extremity strength 5/5 bilaterally  Derm: No rashes noted Neuro: CN II through XII intact, alert and oriented to person and place as well as the month but is not oriented to year.  Strength 5/5 in upper and lower extremities bilaterally as above Psych: Behavior and speech appropriate to situation  Labs and Imaging: CBC BMET  Recent Labs  Lab 04/08/21 1525  WBC 4.3  HGB 11.3*  HCT 34.8*  PLT 257   Recent Labs  Lab 04/08/21 1525  NA 140  K 3.8  CL 106  CO2 26  BUN 7*  CREATININE 0.71  GLUCOSE 95  CALCIUM 8.9      CT HEAD WO CONTRAST  Result Date: 04/08/2021 CLINICAL DATA:  70 year old presenting with acute onset of headache and dizziness. Acute onset of slurred speech, LEFT UPPER EXTREMITY weakness and LEFT-sided facial droop that was initially noticed by the family at 2 o'clock p.m. today. Current history of hypertension. EXAM: CT HEAD WITHOUT CONTRAST TECHNIQUE: Contiguous axial images were obtained from the base of the skull through the vertex without intravenous contrast. COMPARISON:  03/07/2021 and earlier.  MRI brain 12/19/2009. FINDINGS: Brain: Geographic low attenuation involving the RIGHT posterior parietal lobe and RIGHT occipital lobe, new since the CT 1 month ago; the low-attenuation is accentuated by the beam hardening streak artifact from the patient's hair clips which could not be removed. No associated mass  effect or midline shift currently. No associated hemorrhage or hematoma. No extra-axial fluid collections. No focal brain parenchymal abnormality elsewhere. Vascular: Mild BILATERAL carotid siphon atherosclerosis and mild LEFT vertebral artery atherosclerosis. No visible hyperdense vessel. Skull: No skull fracture or other focal osseous abnormality involving the skull. Sinuses/Orbits: Visualized paranasal sinuses, bilateral mastoid air cells and bilateral middle ear cavities well-aerated. Visualized orbits and globes normal in appearance. Other: None. IMPRESSION: 1. Likely acute nonhemorrhagic stroke involving the RIGHT posterior parietal lobe and RIGHT occipital lobe. 2. No mass effect or midline shift currently. I personally telephoned these results at the time of interpretation on 04/08/2021 at 4:05 pm to provider [name], MD, who verbally acknowledged these results. Electronically Signed   By: Evangeline Dakin M.D.   On: 04/08/2021 16:09   MR ANGIO HEAD WO CONTRAST  Result Date: 04/08/2021 CLINICAL DATA:  Neuro deficit, acute, stroke suspected. EXAM: MRI HEAD WITHOUT CONTRAST MRA HEAD WITHOUT CONTRAST TECHNIQUE: Multiplanar, multi-echo pulse sequences of the brain and surrounding structures were acquired without intravenous contrast. Angiographic images of the Circle of Willis were acquired using MRA technique without intravenous contrast. COMPARISON: No pertinent prior exam. COMPARISON:  Noncontrast head CT performed earlier today 04/08/2021. CT angiogram head/neck 03/07/2021. Brain MRI 12/19/2009. FINDINGS: MRI HEAD FINDINGS Brain: Mild generalized cerebral and cerebellar atrophy. Mild multifocal T2/FLAIR hyperintensity within the cerebral white matter, nonspecific but most often secondary to chronic small vessel ischemia. Findings have progressed as compared to the brain MRI of 12/19/2009. 1.2 x 0.6 cm dural-based mass along the right aspect of the anterior falx, likely reflecting an incidental  meningioma (series 6, image 20). There is no acute infarct. No chronic intracranial blood products. No extra-axial fluid collection. No midline shift. Vascular: Expected proximal arterial flow voids. Skull and upper cervical spine: No focal marrow lesion. Sinuses/Orbits: Visualized orbits show no acute finding. No significant paranasal sinus disease. MRA HEAD FINDINGS Anterior circulation: The intracranial internal carotid arteries are patent. The M1 middle cerebral arteries are patent. No M2 proximal branch occlusion or high-grade proximal  stenosis is identified. The anterior cerebral arteries are patent. No intracranial aneurysm is identified. Posterior circulation: The visualized intracranial vertebral arteries are patent. The basilar artery is patent. The posterior cerebral arteries are patent. Posterior communicating arteries are hypoplastic or absent bilaterally Anatomic variants: As described IMPRESSION: MRI brain: 1. No evidence of acute infarction 2. 1.2 x 0.6 cm dural-based mass along the right aspect of the anterior falx, likely reflecting an incidental meningioma. 3. Mild generalized parenchymal atrophy and cerebral white matter chronic small vessel ischemic disease, progressed as compared to the brain MRI of 12/19/2009. MRA head: Unremarkable MR angiography of the head. No intracranial large vessel occlusion or proximal high-grade arterial stenosis. Electronically Signed   By: Kellie Simmering DO   On: 04/08/2021 18:41   MR BRAIN WO CONTRAST  Result Date: 04/08/2021 CLINICAL DATA:  Neuro deficit, acute, stroke suspected. EXAM: MRI HEAD WITHOUT CONTRAST MRA HEAD WITHOUT CONTRAST TECHNIQUE: Multiplanar, multi-echo pulse sequences of the brain and surrounding structures were acquired without intravenous contrast. Angiographic images of the Circle of Willis were acquired using MRA technique without intravenous contrast. COMPARISON: No pertinent prior exam. COMPARISON:  Noncontrast head CT performed earlier  today 04/08/2021. CT angiogram head/neck 03/07/2021. Brain MRI 12/19/2009. FINDINGS: MRI HEAD FINDINGS Brain: Mild generalized cerebral and cerebellar atrophy. Mild multifocal T2/FLAIR hyperintensity within the cerebral white matter, nonspecific but most often secondary to chronic small vessel ischemia. Findings have progressed as compared to the brain MRI of 12/19/2009. 1.2 x 0.6 cm dural-based mass along the right aspect of the anterior falx, likely reflecting an incidental meningioma (series 6, image 20). There is no acute infarct. No chronic intracranial blood products. No extra-axial fluid collection. No midline shift. Vascular: Expected proximal arterial flow voids. Skull and upper cervical spine: No focal marrow lesion. Sinuses/Orbits: Visualized orbits show no acute finding. No significant paranasal sinus disease. MRA HEAD FINDINGS Anterior circulation: The intracranial internal carotid arteries are patent. The M1 middle cerebral arteries are patent. No M2 proximal branch occlusion or high-grade proximal stenosis is identified. The anterior cerebral arteries are patent. No intracranial aneurysm is identified. Posterior circulation: The visualized intracranial vertebral arteries are patent. The basilar artery is patent. The posterior cerebral arteries are patent. Posterior communicating arteries are hypoplastic or absent bilaterally Anatomic variants: As described IMPRESSION: MRI brain: 1. No evidence of acute infarction 2. 1.2 x 0.6 cm dural-based mass along the right aspect of the anterior falx, likely reflecting an incidental meningioma. 3. Mild generalized parenchymal atrophy and cerebral white matter chronic small vessel ischemic disease, progressed as compared to the brain MRI of 12/19/2009. MRA head: Unremarkable MR angiography of the head. No intracranial large vessel occlusion or proximal high-grade arterial stenosis. Electronically Signed   By: Kellie Simmering DO   On: 04/08/2021 18:41    Lurline Del, DO 04/08/2021, 9:16 PM PGY-2, [place] Intern pager: [phone], text pages welcome

## 2021-04-08 NOTE — ED Notes (Signed)
Provider at bedside

## 2021-04-08 NOTE — ED Notes (Signed)
Spoke with provider about pt headache, and eating, and bp at this time. Advised new orders for headache and advised they were holding bp medication per neurology.. family made aware of same and was advised someone was bringing them food

## 2021-04-08 NOTE — ED Triage Notes (Signed)
Pt arrived via [facility] from home. EMS reports that pt's family called when they noticed pt began to have sudden onset of slurred speech, left arm weakness, and left sided facial droop at approx 1400 today. Family reports s/s resolved just prior to EMS arrival. FD reported to EMS that pt was symptomatic initially with them. EMS reports pt has had no neuro deficit throughout their care but has c/o headache and has been hypertensive.

## 2021-04-08 NOTE — Consult Note (Signed)
NEUROLOGY CONSULTATION NOTE   Date of service: Apr 08, 2021 Patient Name: [name] MRN:  [id number] DOB:  1951-08-19 Reason for consult: "Stroke" Requesting Provider: [name], MD _ _ _   _ __   _ __ _ _  __ __   _ __   __ _  History of Present Illness  [name] is a 70 y.o. female with PMH significant for DM2, HTN, HLD, thyroid disease, ?seizures on keppra 500mg  BID who presents with acute onset L facial droop and L arm weakness. Patient was at home around 1400, when she had acute onset symptoms. Symptome resolved by the time EMS arrived with a NIHSS of 0.  Takes aspirin at home, does not smoke, no etOH or recreational substances. No prior hx of stroke. Has a family hx of strokes.  CTH w/o contrast with a likely acute nonhemorrhagic stroke involving the RIGHT posterior parietal lobe and RIGHT occipital lobe. No mass effect or midline shift currently.  NIHSS: 0 MRS: 0 TPA/Thrombectomy: No symptoms. LKW: 1400 on 04/08/21.(symptoms resolved)   ROS   Constitutional Denies weight loss, fever and chills.   HEENT Denies changes in vision and hearing.   Respiratory Denies SOB and cough.   CV Denies palpitations and CP   GI Denies abdominal pain, nausea, vomiting and diarrhea.   GU Denies dysuria and urinary frequency.   MSK Denies myalgia and joint pain.   Skin Denies rash and pruritus.   Neurological Endorses headache but no syncope.   Psychiatric Denies recent changes in mood. Denies anxiety and depression.   Past History   Past Medical History:  Diagnosis Date  . Allergy   . Anxiety   . Arthritis   . Diabetes mellitus without complication ([place])    on metformin  . Hx of adenomatous colonic polyps 01/26/2015   and also 2021 - recall 2024  . Hyperlipidemia   . Hypertension   . Memory loss of unknown cause 03/04/2002   suspect depression with psychotic features. patient hospitalized and work-up negative.   . Panic attack    5-6 a day per pt and husband    . Seizures ([place])    per husband last seizure 3 yeras ago- documented 06-29-2020  . Thyroid disease    thyroid surgery when young   Past Surgical History:  Procedure Laterality Date  . BUNIONECTOMY Bilateral   . COLONOSCOPY    . THYROIDECTOMY, PARTIAL    . TONSILLECTOMY     Family History  Problem Relation Age of Onset  . Colon cancer Neg Hx   . Rectal cancer Neg Hx   . Stomach cancer Neg Hx   . Colon polyps Neg Hx   . Esophageal cancer Neg Hx    Social History   Socioeconomic History  . Marital status: Married    Spouse name: Not on file  . Number of children: Not on file  . Years of education: Not on file  . Highest education level: Not on file  Occupational History  . Not on file  Tobacco Use  . Smoking status: Never Smoker  . Smokeless tobacco: Never Used  Vaping Use  . Vaping Use: Never used  Substance and Sexual Activity  . Alcohol use: No    Alcohol/week: 0.0 standard drinks  . Drug use: No  . Sexual activity: Not on file  Other Topics Concern  . Not on file  Social History Narrative  . Not on file   Social Determinants of Health  Financial Resource Strain: Not on file  Food Insecurity: Not on file  Transportation Needs: Not on file  Physical Activity: Not on file  Stress: Not on file  Social Connections: Not on file   Allergies  Allergen Reactions  . Chocolate Hives  . Coffee Bean Extract [Coffea Arabica] Rash  . Peanut-Containing Drug Products Rash    Possible allergy    Medications  (Not in a hospital admission)    Vitals   Vitals:   04/08/21 1520 04/08/21 1545 04/08/21 1600 04/08/21 1615  BP:  (!) 189/80 (!) 185/87 (!) 202/90  Pulse:  63 64 (!) 58  Resp:  13 14 14   Temp:      TempSrc:      SpO2:  99% 98% 100%  Weight: 68 kg     Height: 5\' 3"  (1.6 m)        Body mass index is 26.57 kg/m.  Physical Exam   General: Laying comfortably in bed; in no acute distress. HENT: Normal oropharynx and mucosa. Normal external  appearance of ears and nose.  Neck: Supple, no pain or tenderness  CV: No JVD. No peripheral edema.  Pulmonary: Symmetric Chest rise. Normal respiratory effort.  Abdomen: Soft to touch, non-tender.  Ext: No cyanosis, edema, or deformity  Skin: No rash. Normal palpation of skin.   Musculoskeletal: Normal digits and nails by inspection. No clubbing.   Neurologic Examination  Mental status/Cognition: Alert, oriented to self, place, month and year, good attention.  Speech/language: Fluent, comprehension intact, object naming intact, repetition intact.  Cranial nerves:   CN II Pupils equal and reactive to light, no VF deficits    CN III,IV,VI EOM intact, no gaze preference or deviation, no nystagmus    CN V normal sensation in V1, V2, and V3 segments bilaterally    CN VII no asymmetry, no nasolabial fold flattening    CN VIII normal hearing to speech    CN IX & X normal palatal elevation, no uvular deviation    CN XI 5/5 head turn and 5/5 shoulder shrug bilaterally    CN XII midline tongue protrusion    Motor:  Muscle bulk: normal, tone normal, pronator drift none tremor none Mvmt Root Nerve  Muscle Right Left Comments  SA C5/6 Ax Deltoid 5 5   EF C5/6 Mc Biceps 5 5   EE C6/7/8 Rad Triceps 5 5   WF C6/7 Med FCR     WE C7/8 PIN ECU     F Ab C8/T1 U ADM/FDI 5 5   HF L1/2/3 Fem Illopsoas 5 5   KE L2/3/4 Fem Quad 5 5   DF L4/5 D Peron Tib Ant 5 5   PF S1/2 Tibial Grc/Sol 5 5    Reflexes:  Right Left Comments  Pectoralis      Biceps (C5/6) 1 1   Brachioradialis (C5/6) 1 1    Triceps (C6/7) 1 1    Patellar (L3/4) 1 1    Achilles (S1)      Hoffman      Plantar     Jaw jerk    Sensation:  Light touch intact   Pin prick    Temperature    Vibration   Proprioception    Coordination/Complex Motor:  - Finger to Nose intact BL - Heel to shin intact BL - Rapid alternating movement are normal - Gait: Deferred.  Labs   CBC:  Recent Labs  Lab 04/08/21 1525  WBC 4.3   NEUTROABS 2.0  HGB 11.3*  HCT 34.8*  MCV 96.7  PLT 527    Basic Metabolic Panel:  Lab Results  Component Value Date   NA 140 04/08/2021   K 3.8 04/08/2021   CO2 26 04/08/2021   GLUCOSE 95 04/08/2021   BUN 7 (L) 04/08/2021   CREATININE 0.71 04/08/2021   CALCIUM 8.9 04/08/2021   GFRNONAA >60 04/08/2021   GFRAA 94 12/21/2020   Lipid Panel:  Lab Results  Component Value Date   LDLCALC 83 05/02/2020   HgbA1c:  Lab Results  Component Value Date   HGBA1C 7.0 (A) 12/21/2020   Urine Drug Screen:     Component Value Date/Time   LABOPIA NONE DETECTED 01/01/2009 0347   COCAINSCRNUR NONE DETECTED 01/01/2009 0347   LABBENZ POSITIVE (A) 01/01/2009 0347   AMPHETMU NONE DETECTED 01/01/2009 0347   THCU NONE DETECTED 01/01/2009 0347   LABBARB  01/01/2009 0347    NONE DETECTED        DRUG SCREEN FOR MEDICAL PURPOSES ONLY.  IF CONFIRMATION IS NEEDED FOR ANY PURPOSE, NOTIFY LAB WITHIN 5 DAYS.        LOWEST DETECTABLE LIMITS FOR URINE DRUG SCREEN Drug Class       Cutoff (ng/mL) Amphetamine      1000 Barbiturate      200 Benzodiazepine   782 Tricyclics       423 Opiates          300 Cocaine          300 THC              50    Alcohol Level     Component Value Date/Time   ETH <10 04/08/2021 1525    CT Head without contrast: 1. Likely acute nonhemorrhagic stroke involving the RIGHT posterior parietal lobe and RIGHT occipital lobe. 2. No mass effect or midline shift currently.  MR Angio head without contrast and Carotid Duplex BL: pending  MRI Brain: Pending   Impression   [name] is a 70 y.o. female with PMH significant for DM2, HTN, HLD, thyroid disease, ?seizures on keppra 500mg  BID who presents with acute onset L facial droop and L arm weakness Symptoms resolved. Her neurologic examination is notable for no deficit at all.  Primary Diagnosis:  Cerebral infarction due to occlusion or stenosis of right posterior cerebral artery.    Secondary  Diagnosis: Essential (primary) hypertension and Type 2 diabetes mellitus w/o complications  Recommendations  Plan:  - Frequent Neuro checks per stroke unit protocol - Recommend brain imaging with MRI Brain without contrast - Recommend Vascular imaging with MRA Angio Head without contrast and US Carotid doppler - Recommend obtaining TTE - Recommend obtaining Lipid panel with LDL - Please start statin if LDL > 70 - Recommend HbA1c - Antithrombotic - Aspirin 81mg  daily along with plavix 75mg  daily x 21 days, followed by aspirin 81mg  daily alone. - Recommend DVT ppx - SBP goal - permissive hypertension first 24 h < 220/110. Held home meds.  - Recommend Telemetry monitoring for arrythmia - Recommend bedside swallow screen prior to PO intake. - Stroke education booklet - Recommend PT/OT/SLP consult  Seizure: - Continue home Keppra 500mg  BID.  ______________________________________________________________________   Thank you for the opportunity to take part in the care of this patient. If you have any further questions, please contact the neurology consultation attending.  Signed,  [place] Pager Number [phone] _ _ _   _ __   _ __ _ _  __ __   _ __   __ _  

## 2021-04-08 NOTE — ED Notes (Signed)
Patient transported to CT 

## 2021-04-08 NOTE — Progress Notes (Signed)
Patient passed bedside swallow study.  Will place order for diet.  Also, spoke to pharmacy regarding patient's home medication Onfi which she takes in conjunction with Keppra for her history of seizures.  I had spoken with her husband who endorsed that she does indeed take both these medications as prescribed.  Per pharmacy it should be okay to continue this medication even though she also takes clonazepam for anxiety.  Per the patient it does seem that she takes the clonazepam almost scheduled even though it is a twice daily as needed medication and pharmacy reiterated that it should be only used when absolutely needed from an anxiety standpoint to minimize other interactions and complications.  Greatly appreciate pharmacy's assistance.

## 2021-04-08 NOTE — ED Notes (Signed)
Pt ambulated to bathroom w/ assistance, denies dizziness but wobbly.

## 2021-04-08 NOTE — Hospital Course (Addendum)
TIA Patient presenting with new onset on slurred speech and left sided weakness. Symptoms resolved within 50mins of onset. CT Head remarkable for likely acute nonhemorrhagic stroke involving the RIGHT posterior parietal lobe and RIGHT occipital lobe. Home medications include aspirin.  Neurology was brought on board, however an MRI was performed which showed no acute stroke. Per Neurology recommendations include***  HTN BP at admission was 206/88. We will hold home medications in setting of acute stroke for permissive hypertension with goal of <286 systolic and 381 diastolic, however this was before the MRI was completed which showed no acute stroke.  Home medications include losartan.  T2DM Last HbA1C in 01/22 was 7.0.   Seizures Patient with a history of seizures and is maintained on Keppra 1500 twice daily as well as Onfi 10 mg daily.   PCP follow-up task: -Recommend discussion on patient's clonazepam, per the daughter it seems the patient uses this twice daily even though it is supposed to be as needed twice daily -***

## 2021-04-08 NOTE — Progress Notes (Signed)
Family Medicine Teaching Service Daily Progress Note Intern Pager: [phone]  Patient name: [name] Medical record number: [id number] Date of birth: 15-Feb-1951 Age: 70 y.o. Gender: female  Primary Care Provider: Cleophas Dunker, DO Consultants: Neurology Code Status: Full  Pt Overview and Major Events to Date:  5/14-admitted  Assessment and Plan: [name] is a 70 y.o. female presenting with slurred speech and left sided weakness. PMH is significant for hypertension, type 2 diabetes, thyroid disease s/p thyroidectomy, hyperlipidemia, seizures, migraines, anxiety.   TIA: Patient denies any further symptoms at this time.  Physical exam with no focal deficits.  A1c, lipid panel, TSH pending -Neurology on board, appreciate recommendations -Follow-up carotid Dopplers -TTE -Permissive hypertension for 24 hours <220/110 -Holding blood pressure medications for permissive hypertension -Patient passed bedside swallow with diet in place -PT/OT/SLP  Hypertension: Most recent blood pressure range of 181/99.  Currently holding blood pressure medications for permissive hypertension for the first 24 hours.  Home medications include losartan 50 mg daily. -Currently holding home medications for permissive hypertension for first 24 hours -Consider restarting medications later this afternoon/evening after 24 hours has passed  Type 2 diabetes: Home medications include metformin 500 mg twice daily.  A1c pending.  CBGs overnight of 92-98. -Hold metformin -Sensitive sliding scale  Hypokalemia: Potassium 3.2 this morning.  Replaced with 81meq K+ oral. -A.m. BMP -Replete as needed  Seizures: Home medications include Keppra 1500 mg twice daily as well as Onfi 10 mg daily -Continue home medications  Thyroid disease status post thyroidectomy: Home medications include levothyroxine 75 mcg/day.  TSH of 0.541. -Continue home levothyroxine  Tension style headache with history of  migraines: Today she states her headache is much improved.   Home medications include Excedrin Migraine as well as acetaminophen.  Patient endorses a history of migraines for many years but on presentation had a different headache which was consistent with tension style headache with vice like feel.   -Hold home Excedrin Migraine  Anxiety: Home medications of Seroquel at night, Atarax 25 mg twice daily as needed, clonazepam 0.5 mg twice daily as needed -Continue home Seroquel and Atarax, as patient takes her clonazepam regularly we will also continue this but recommend that the patient try to decrease the amount of usage that she has with this medication particular when it comes time for discharge.  FEN/GI: Heart healthy/carb modified PPx: Lovenox  Status is: Observation  The patient will require care spanning > 2 midnights and should be moved to inpatient because: Ongoing diagnostic testing needed not appropriate for outpatient work up  Dispo: The patient is from: Home              Anticipated d/c is to: Home              Patient currently is not medically stable to d/c.   Difficult to place patient No   Subjective:  Patient denies symptoms at this time.  States her headache is much improved from yesterday.   Objective: Temp:  [97.5 F (36.4 C)-97.8 F (36.6 C)] 97.8 F (36.6 C) (05/14 2310) Pulse Rate:  [57-74] 58 (05/14 2300) Resp:  [13-24] 13 (05/14 2300) BP: (172-214)/(80-150) 186/88 (05/14 2300) SpO2:  [97 %-100 %] 97 % (05/14 2300) Weight:  [68 kg] 68 kg (05/14 1520) Physical Exam: General: Alert and oriented in no apparent distress Heart: Regular rate and rhythm with no murmurs appreciated Lungs: CTA bilaterally, no wheezing Neuro: CN II through XII intact, fine touch sensation intact in  upper and lower extremities bilaterally, strength 5/5 in upper and lower extremities bilaterally Skin: Warm and dry  Laboratory: Recent Labs  Lab 04/08/21 1525  WBC 4.3  HGB 11.3*   HCT 34.8*  PLT 257   Recent Labs  Lab 04/08/21 1525  NA 140  K 3.8  CL 106  CO2 26  BUN 7*  CREATININE 0.71  CALCIUM 8.9  PROT 6.8  BILITOT 0.5  ALKPHOS 57  ALT 19  AST 21  GLUCOSE 95     Lurline Del, DO 04/08/2021, 11:34 PM PGY-2, [facility] Intern pager: [phone], text pages welcome

## 2021-04-09 ENCOUNTER — Encounter (HOSPITAL_COMMUNITY): Payer: Self-pay | Admitting: Family Medicine

## 2021-04-09 ENCOUNTER — Observation Stay (HOSPITAL_COMMUNITY): Payer: Medicare Other

## 2021-04-09 ENCOUNTER — Observation Stay (HOSPITAL_BASED_OUTPATIENT_CLINIC_OR_DEPARTMENT_OTHER): Payer: Medicare Other

## 2021-04-09 DIAGNOSIS — I10 Essential (primary) hypertension: Secondary | ICD-10-CM | POA: Diagnosis not present

## 2021-04-09 DIAGNOSIS — G459 Transient cerebral ischemic attack, unspecified: Secondary | ICD-10-CM | POA: Diagnosis not present

## 2021-04-09 DIAGNOSIS — I639 Cerebral infarction, unspecified: Secondary | ICD-10-CM | POA: Diagnosis not present

## 2021-04-09 DIAGNOSIS — I6389 Other cerebral infarction: Secondary | ICD-10-CM

## 2021-04-09 LAB — ECHOCARDIOGRAM COMPLETE
AR max vel: 2.31 cm2
AV Area VTI: 2.28 cm2
AV Area mean vel: 2.31 cm2
AV Mean grad: 2 mmHg
AV Peak grad: 4.5 mmHg
Ao pk vel: 1.06 m/s
Height: 63 in
S' Lateral: 3 cm
Weight: 2400 oz

## 2021-04-09 LAB — CBC
HCT: 33.5 % — ABNORMAL LOW (ref 36.0–46.0)
Hemoglobin: 10.6 g/dL — ABNORMAL LOW (ref 12.0–15.0)
MCH: 31.2 pg (ref 26.0–34.0)
MCHC: 31.6 g/dL (ref 30.0–36.0)
MCV: 98.5 fL (ref 80.0–100.0)
Platelets: 259 10*3/uL (ref 150–400)
RBC: 3.4 MIL/uL — ABNORMAL LOW (ref 3.87–5.11)
RDW: 14.6 % (ref 11.5–15.5)
WBC: 4.1 10*3/uL (ref 4.0–10.5)
nRBC: 0 % (ref 0.0–0.2)

## 2021-04-09 LAB — BASIC METABOLIC PANEL
Anion gap: 8 (ref 5–15)
BUN: <5 mg/dL — ABNORMAL LOW (ref 8–23)
CO2: 25 mmol/L (ref 22–32)
Calcium: 8.7 mg/dL — ABNORMAL LOW (ref 8.9–10.3)
Chloride: 106 mmol/L (ref 98–111)
Creatinine, Ser: 0.54 mg/dL (ref 0.44–1.00)
GFR, Estimated: >60 mL/min (ref >60)
Glucose, Bld: 98 mg/dL (ref 70–99)
Potassium: 3.2 mmol/L — ABNORMAL LOW (ref 3.5–5.1)
Sodium: 139 mmol/L (ref 135–145)

## 2021-04-09 LAB — CBG MONITORING, ED
Glucose-Capillary: 167 mg/dL — ABNORMAL HIGH (ref 70–99)
Glucose-Capillary: 129 mg/dL — ABNORMAL HIGH (ref 70–99)

## 2021-04-09 LAB — TSH: TSH: 0.541 u[IU]/mL (ref 0.350–4.500)

## 2021-04-09 LAB — GLUCOSE, CAPILLARY: Glucose-Capillary: 116 mg/dL — ABNORMAL HIGH (ref 70–99)

## 2021-04-09 MED ORDER — ASPIRIN EC 81 MG PO TBEC: 81.0000 mg | DELAYED_RELEASE_TABLET | Freq: Every day | ORAL | 0 refills | Status: AC

## 2021-04-09 MED ORDER — ACETAMINOPHEN 160 MG/5ML PO SOLN: 650.0000 mg | Freq: Four times a day (QID) | ORAL | Status: DC | PRN

## 2021-04-09 MED ORDER — ACETAMINOPHEN 650 MG RE SUPP: 650.0000 mg | Freq: Four times a day (QID) | RECTAL | Status: DC | PRN

## 2021-04-09 MED ORDER — LOSARTAN POTASSIUM 50 MG PO TABS
50.0000 mg | ORAL_TABLET | Freq: Every day | ORAL | Status: DC
  Administered 2021-04-09: 50 mg via ORAL
  Filled 2021-04-09: qty 1

## 2021-04-09 MED ORDER — ACETAMINOPHEN 500 MG PO TABS
1000.0000 mg | ORAL_TABLET | Freq: Four times a day (QID) | ORAL | Status: DC | PRN
  Administered 2021-04-09: 1000 mg via ORAL
  Filled 2021-04-09: qty 2

## 2021-04-09 MED ORDER — POTASSIUM CHLORIDE CRYS ER 20 MEQ PO TBCR: 40.0000 meq | EXTENDED_RELEASE_TABLET | Freq: Once | ORAL | Status: DC

## 2021-04-09 MED ORDER — IBUPROFEN 200 MG PO TABS
400.0000 mg | ORAL_TABLET | Freq: Once | ORAL | Status: AC
  Administered 2021-04-09: 400 mg via ORAL
  Filled 2021-04-09: qty 2

## 2021-04-09 MED ORDER — CLOPIDOGREL BISULFATE 75 MG PO TABS: 75.0000 mg | ORAL_TABLET | Freq: Every day | ORAL | 1 refills | Status: DC

## 2021-04-09 NOTE — Evaluation (Signed)
Physical Therapy Evaluation Patient Details Name: [name] MRN: [id number] DOB: Oct 06, 1951 Today's Date: 04/09/2021   History of Present Illness  70 y.o. female presenting to ED with slurred speech and L sided weakness. MRI with no acute finding, showing a 1.2 x 0.6 dural based mass, likely meningioma present.  PMH includes hypertension, type 2 diabetes, thyroid disease s/p thyroidectomy, hyperlipidemia, seizures, migraines, anxiety.  Clinical Impression  Pt reports baseline cognitive deficits and "memory issues". Pt demonstrates ability to perform bed mobility and transfers without the need for physical assistance. Pt ambulates household distances without physical assistance and no AD, demonstrating mild instability with no LOB noted. Pt demonstrates deficits in strength, endurance, power, and balance and will benefit from acute PT to improve dynamic balance and overall mobility. SPT recommends HHPT at this time to improve safety and independence in mobility as pt demonstrates balance deficits.    Follow Up Recommendations Home health PT    Equipment Recommendations  None recommended by PT    Recommendations for Other Services       Precautions / Restrictions Precautions Precautions: Fall Restrictions Weight Bearing Restrictions: No      Mobility  Bed Mobility Overal bed mobility: Needs Assistance Bed Mobility: Supine to Sit;Sit to Supine     Supine to sit: Min guard Sit to supine: Min guard   General bed mobility comments: min G for safety.    Transfers Overall transfer level: Needs assistance Equipment used: None Transfers: Sit to/from Stand Sit to Stand: Min guard         General transfer comment: min G for safety.  Ambulation/Gait Ambulation/Gait assistance: Min guard Gait Distance (Feet): 200 Feet Assistive device: None Gait Pattern/deviations: Step-through pattern;Decreased stride length Gait velocity: decreased Gait velocity interpretation: <1.8  ft/sec, indicate of risk for recurrent falls General Gait Details: Pt demonstrates instability with gait and decreased gait speed. When holding a conversation during gait, pt stops multiple times to continue conversating.  Stairs            Wheelchair Mobility    Modified Rankin (Stroke Patients Only)       Balance Overall balance assessment: Needs assistance Sitting-balance support: No upper extremity supported;Feet unsupported Sitting balance-Leahy Scale: Good Sitting balance - Comments: Pt does not require UE support to maintain dynamic sitting balance.   Standing balance support: No upper extremity supported;During functional activity Standing balance-Leahy Scale: Fair Standing balance comment: Pt is able to maintain static standing balance and balance during gait with out the need for UE support, but demonstrates mild instability.                             Pertinent Vitals/Pain Pain Assessment: No/denies pain    Home Living Family/patient expects to be discharged to:: Private residence Living Arrangements: Spouse/significant other Available Help at Discharge: Family;Available 24 hours/day Type of Home: House Home Access: Stairs to enter Entrance Stairs-Rails: None Entrance Stairs-Number of Steps: 1 Home Layout: One level Home Equipment: None      Prior Function Level of Independence: Independent         Comments: Pt reports ability to perform all ADLs independently. Pt reports she has memory deficits and is forgetful. Pt reports she drives, but often forgets where she is going. Daughter reminds pt that she has not drive in one year.     Hand Dominance        Extremity/Trunk Assessment   Upper Extremity Assessment Upper Extremity  Assessment: Defer to OT evaluation    Lower Extremity Assessment Lower Extremity Assessment: Generalized weakness    Cervical / Trunk Assessment Cervical / Trunk Assessment: Normal  Communication    Communication: No difficulties  Cognition Arousal/Alertness: Awake/alert Behavior During Therapy: WFL for tasks assessed/performed Overall Cognitive Status: History of cognitive impairments - at baseline                                 General Comments: Pt discusses that when she drives she forgets where she is going. Daughter reminds pt that she has not driven in the last year. Pt follows one step commands consistently.      General Comments General comments (skin integrity, edema, etc.): Pt reports she has had some memory deficits over the years. Pt states that she uses adaptive technology to help her with medications.    Exercises     Assessment/Plan    PT Assessment Patient needs continued PT services  PT Problem List Decreased strength;Decreased balance;Decreased mobility;Decreased coordination;Decreased cognition;Decreased knowledge of use of DME;Decreased safety awareness;Decreased knowledge of precautions       PT Treatment Interventions Gait training;Stair training;Functional mobility training;Therapeutic activities;Therapeutic exercise;Balance training;Cognitive remediation;Patient/family education    PT Goals (Current goals can be found in the Care Plan section)  Acute Rehab PT Goals Patient Stated Goal: Get better and go home PT Goal Formulation: With patient Time For Goal Achievement: 04/23/21 Potential to Achieve Goals: Good    Frequency Min 3X/week   Barriers to discharge        Co-evaluation               AM-PAC PT "6 Clicks" Mobility  Outcome Measure Help needed turning from your back to your side while in a flat bed without using bedrails?: A Little Help needed moving from lying on your back to sitting on the side of a flat bed without using bedrails?: A Little Help needed moving to and from a bed to a chair (including a wheelchair)?: A Little Help needed standing up from a chair using your arms (e.g., wheelchair or bedside chair)?: A  Little Help needed to walk in hospital room?: A Little Help needed climbing 3-5 steps with a railing? : A Little 6 Click Score: 18    End of Session Equipment Utilized During Treatment: Gait belt Activity Tolerance: Patient tolerated treatment well Patient left: in bed;with call bell/phone within reach;with family/visitor present Nurse Communication: Mobility status PT Visit Diagnosis: Unsteadiness on feet (R26.81);Other abnormalities of gait and mobility (R26.89);Muscle weakness (generalized) (M62.81)    Time: 8144-8185 PT Time Calculation (min) (ACUTE ONLY): 26 min   Charges:   PT Evaluation $PT Eval Low Complexity: 1 Low          Acute Rehab  Pager: [phone]   Garwin Brothers, SPT  04/09/2021, 1:19 PM

## 2021-04-09 NOTE — Discharge Instructions (Signed)
You were seen in the hospital for concern for stroke.  Your MRI did not show a stroke.  You were also not found to have any blockages in the vessels in your brain or in your neck.  Your symptoms were concerning for a mini stroke or TIA.  Given this, we will start you on aspirin 81 mg and Plavix 75 mg.  You will take both of these together for 3 weeks, then you should stop taking the aspirin, and only take Plavix.  You should continue your home Lipitor.  Neurology will have you follow-up with them in 4 weeks.  Transient Ischemic Attack A transient ischemic attack (TIA) causes the same symptoms as a stroke, but the symptoms go away quickly. A TIA happens when blood flow to the brain is blocked. Having a TIA means you may be at risk for a stroke. A TIA is a medical emergency. What are the causes? A TIA is caused by a blocked artery in the head or neck. This means the brain does not get the blood supply it needs. A blockage can be caused by:  Fatty buildup in an artery in the head or neck.  A blood clot.  A tear in an artery.  Irritation and swelling (inflammation) of an artery. Sometimes the cause is not known. What increases the risk? Certain things may make you more likely to have a TIA. Some of these are things that you can change, such as:  Being very overweight.  Using products that have nicotine or tobacco.  Taking birth control pills.  Not being active.  Drinking too much alcohol.  Using drugs. Health conditions that may increase your risk include:  High blood pressure.  High cholesterol.  Diabetes.  Heart disease.  A heartbeat that is not regular (atrial fibrillation).  Sickle cell disease.  Sleep problems (sleep apnea).  Long-term diseases that cause irritation and swelling.  Problems with blood clotting. Other risk factors include:  Being over the age of 57.  Being female.  Having a family history of stroke.  Having had blood clots, stroke, TIA, or heart  attack in the past.  Having a history of high blood pressure when pregnant (preeclampsia).  Very bad headaches (migraines). What are the signs or symptoms? The symptoms of a TIA are like those of a stroke. They can include:  Weakness or loss of feeling in your face, arm, or leg. This often happens on one side of your body.  Trouble walking.  Trouble moving your arms or legs.  Trouble talking or understanding what people are saying.  Problems with how you see.  Feeling dizzy.  Feeling confused.  Loss of balance or coordination.  Feeling like you may vomit (nausea) or you vomit.  Having a very bad headache. If you can, note what time you started to have symptoms. Tell your doctor.   How is this treated? The goal of treatment is to lower the risk for a stroke. This may include:  Changes to diet and lifestyle, such as getting regular exercise and stopping smoking.  Taking medicines to: ? Thin the blood. ? Lower blood pressure. ? Lower cholesterol.  Treating other health conditions, such as diabetes. If testing shows that an artery in your brain is narrow, your doctor may recommend a procedure to:  Take the blockage out of your artery (carotid endarterectomy).  Open or widen an artery in your neck (carotid angioplasty and stenting). Follow these instructions at home: Medicines  Take over-the-counter and prescription  medicines only as told by your doctor.  If you were told to take aspirin or another medicine to thin your blood, take it exactly as told by your doctor. ? Taking too much of the medicine can cause bleeding. ? Taking too little of the medicine may not work to treat the problem. Eating and drinking  Eat 5 or more servings of fruits and vegetables each day.  Follow instructions from your doctor about your diet. You may need to follow a certain diet to help lower your risk of a stroke. You may need to: ? Eat a diet that is low in fat and salt. ? Eat foods  with a lot of fiber. ? Limit carbohydrates and sugar.  If you drink alcohol: ? Limit how much you have to:  0-1 drink a day for women who are not pregnant.  0-2 drinks a day for men. ? Know how much alcohol is in a drink. In the [place] one drink equals one 12 oz bottle of beer (347mL), one 5 oz glass of wine (181mL), or one 1 oz glass of hard liquor (9mL).   General instructions  Keep a healthy weight.  Try to get at least 30 minutes of exercise on most days.  Get treatment if you have sleep problems.  Do not smoke or use any products that contain nicotine or tobacco. If you need help quitting, ask your doctor.  Do not use drugs.  Keep all follow-up visits. Where to find more information  American Stroke Association: www.stroke.org Get help right away if:  You have chest pain.  You have a heartbeat that is not regular.  You have any signs of a stroke. "BE FAST" is an easy way to remember the main warning signs: ? B - Balance. Dizziness, sudden trouble walking, or loss of balance. ? E - Eyes. Trouble seeing or a change in how you see. ? F - Face. Sudden weakness or loss of feeling of the face. The face or eyelid may droop on one side. ? A - Arms. Weakness or loss of feeling in an arm. This happens all of a sudden and most often on one side of the body. ? S - Speech. Sudden trouble speaking, slurred speech, or trouble understanding what people say. ? T - Time. Time to call emergency services. Write down what time symptoms started.  You have other signs of a stroke, such as: ? A sudden, very bad headache with no known cause. ? Feeling like you may vomit. ? Vomiting. ? A seizure. These symptoms may be an emergency. Get help right away. Call your local emergency services (911 in the [place]  Do not wait to see if the symptoms will go away.  Do not drive yourself to the hospital. Summary  A transient ischemic attack (TIA) happens when an artery in the head or neck is  blocked. This causes the same symptoms as a stroke. The symptoms go away quickly.  A TIA is a medical emergency. Get help right away, even if your symptoms go away.  Having a TIA means that you may be at risk for a stroke. Taking medicines and making diet and lifestyle changes can help to prevent a stroke. This information is not intended to replace advice given to you by your health care provider. Make sure you discuss any questions you have with your health care provider. Document Revised: 06/07/2020 Document Reviewed: 06/07/2020 Elsevier Patient Education  [place].

## 2021-04-09 NOTE — ED Notes (Signed)
Pt transported to vascular.  °

## 2021-04-09 NOTE — Progress Notes (Signed)
EEG complete - results pending 

## 2021-04-09 NOTE — Evaluation (Signed)
Occupational Therapy Evaluation Patient Details Name: [name] MRN: [id number] DOB: 1951/03/17 Today's Date: 04/09/2021    History of Present Illness 70 y.o. female presenting to ED with slurred speech and L sided weakness. MRI with no acute finding, showing a 1.2 x 0.6 dural based mass, likely meningioma present.  PMH includes hypertension, type 2 diabetes, thyroid disease s/p thyroidectomy, hyperlipidemia, seizures, migraines, anxiety.   Clinical Impression   Pt admitted with the above diagnoses. At baseline, pt is independent with basic ADLs, endorses h/o memory deficits, followed by her PCP for this. Pt presents at/close to baseline with ADLs. Discussed staregies for safety at home such as carrying her cell phone in her pocket at all times. No further OT needs indicated at this time. OT signing off.     Follow Up Recommendations  No OT follow up    Equipment Recommendations  None recommended by OT    Recommendations for Other Services       Precautions / Restrictions Precautions Precautions: Fall Restrictions Weight Bearing Restrictions: No      Mobility Bed Mobility               General bed mobility comments: sitting EOB at start and end of session    Transfers Overall transfer level: Needs assistance Equipment used: None Transfers: Sit to/from Stand Sit to Stand: Supervision         General transfer comment: supervision for safety. a little extra time. no physical assist    Balance Overall balance assessment: Mild deficits observed, not formally tested                                         ADL either performed or assessed with clinical judgement   ADL Overall ADL's : Needs assistance/impaired Eating/Feeding: Set up   Grooming: Set up   Upper Body Bathing: Set up   Lower Body Bathing: Modified independent   Upper Body Dressing : Set up   Lower Body Dressing: Modified independent   Toilet Transfer:  Supervision/safety   Toileting- Clothing Manipulation and Hygiene: Supervision/safety   Tub/ Shower Transfer: Supervision/safety   Functional mobility during ADLs: Supervision/safety General ADL Comments: Close to baseline with ADLs.     Vision Baseline Vision/History: Wears glasses Wears Glasses: At all times Patient Visual Report: Other (comment) (pt denies both diplopia and blurred vision) Vision Assessment?: No apparent visual deficits     Perception     Praxis      Pertinent Vitals/Pain Pain Assessment: Faces Faces Pain Scale: Hurts a little bit Pain Location: headache Pain Descriptors / Indicators: Aching Pain Intervention(s): Monitored during session     Hand Dominance     Extremity/Trunk Assessment Upper Extremity Assessment Upper Extremity Assessment: Overall WFL for tasks assessed;Generalized weakness   Lower Extremity Assessment Lower Extremity Assessment: Defer to PT evaluation       Communication Communication Communication: No difficulties   Cognition Arousal/Alertness: Awake/alert Behavior During Therapy: WFL for tasks assessed/performed Overall Cognitive Status: History of cognitive impairments - at baseline                                 General Comments: Pt endorses h/o of memory problems, is followed by PCP for this per family reports.   General Comments       Exercises  Shoulder Instructions      Home Living Family/patient expects to be discharged to:: Private residence Living Arrangements: Spouse/significant other Available Help at Discharge: Family;Available 24 hours/day Type of Home: House Home Access: Stairs to enter [employer] of Steps: 1 Entrance Stairs-Rails: None Home Layout: One level     Bathroom Shower/Tub: Tub only   Biochemist, clinical: Standard     Home Equipment: None          Prior Functioning/Environment Level of Independence: Independent        Comments: Pt reports ability  to perform all ADLs independently. Pt reports she has memory deficits and is forgetful.        OT Problem List:        OT Treatment/Interventions:      OT Goals(Current goals can be found in the care plan section) Acute Rehab OT Goals Patient Stated Goal: Get better and go home  OT Frequency:     Barriers to D/C:            Co-evaluation              AM-PAC OT "6 Clicks" Daily Activity     Outcome Measure Help from another person eating meals?: None Help from another person taking care of personal grooming?: None Help from another person toileting, which includes using toliet, bedpan, or urinal?: None Help from another person bathing (including washing, rinsing, drying)?: None Help from another person to put on and taking off regular upper body clothing?: None Help from another person to put on and taking off regular lower body clothing?: None 6 Click Score: 24   End of Session    Activity Tolerance: Patient tolerated treatment well Patient left: in bed;with call bell/phone within reach;with nursing/sitter in room;with family/visitor present  OT Visit Diagnosis: Unsteadiness on feet (R26.81)                Time: 3086-5784 OT Time Calculation (min): 20 min Charges:  OT General Charges $OT Visit: 1 Visit OT Evaluation $OT Eval Low Complexity: 1 Low  Tyrone Schimke, OT Acute Rehabilitation Services Pager: [phone] Office: [phone]   Hortencia Pilar 04/09/2021, 2:32 PM

## 2021-04-09 NOTE — ED Notes (Addendum)
Assuming care. Pt resting in bed @ this time w/ NAD noted. VSS. Cardiac monitoring in place w/ bp cycling and spo2 being monitored. Updated on plan of care. Deny needs or concerns @ this time. Bed low and locked. Side rails up x2

## 2021-04-09 NOTE — ED Notes (Signed)
Attempted to give report x3.

## 2021-04-09 NOTE — ED Notes (Signed)
Attempted to give reportx1 

## 2021-04-09 NOTE — Progress Notes (Signed)
EEG attempted, pt is in process of being transfer to different floor

## 2021-04-09 NOTE — Progress Notes (Addendum)
STROKE TEAM PROGRESS NOTE   INTERVAL HISTORY Her granddaughter is at the bedside providing support..   She complains of a right frontal headache at this time; she is waiting for headache medicine.. She is eager to talk about her 30 year medical history.   Vitals:   04/09/21 0515 04/09/21 0545 04/09/21 0651 04/09/21 0717  BP: (!) 177/95 (!) 187/94 (!) 185/91   Pulse: 67 (!) 55 (!) 54   Resp: 20 13 16    Temp:    97.7 F (36.5 C)  TempSrc:    Oral  SpO2: 96% 98% 98%   Weight:      Height:       CBC:  Recent Labs  Lab 04/08/21 1525 04/09/21 0443  WBC 4.3 4.1  NEUTROABS 2.0  --   HGB 11.3* 10.6*  HCT 34.8* 33.5*  MCV 96.7 98.5  PLT 257 259   Basic Metabolic Panel:  Recent Labs  Lab 04/08/21 1525 04/09/21 0443  NA 140 139  K 3.8 3.2*  CL 106 106  CO2 26 25  GLUCOSE 95 98  BUN 7* <5*  CREATININE 0.71 0.54  CALCIUM 8.9 8.7*    Lipid Panel: No results for input(s): CHOL, TRIG, HDL, CHOLHDL, VLDL, LDLCALC in the last 168 hours.  HgbA1c: No results for input(s): HGBA1C in the last 168 hours. Urine Drug Screen:  Recent Labs  Lab 04/08/21 1514  LABOPIA NONE DETECTED  COCAINSCRNUR NONE DETECTED  LABBENZ NONE DETECTED  AMPHETMU NONE DETECTED  THCU NONE DETECTED  LABBARB NONE DETECTED    Alcohol Level  Recent Labs  Lab 04/08/21 1525  ETH <10    IMAGING past 24 hours CT HEAD WO CONTRAST  Result Date: 04/08/2021 CLINICAL DATA:  70 year old presenting with acute onset of headache and dizziness. Acute onset of slurred speech, LEFT UPPER EXTREMITY weakness and LEFT-sided facial droop that was initially noticed by the family at 2 o'clock p.m. today. Current history of hypertension. EXAM: CT HEAD WITHOUT CONTRAST TECHNIQUE: Contiguous axial images were obtained from the base of the skull through the vertex without intravenous contrast. COMPARISON:  03/07/2021 and earlier.  MRI brain 12/19/2009. FINDINGS: Brain: Geographic low attenuation involving the RIGHT posterior  parietal lobe and RIGHT occipital lobe, new since the CT 1 month ago; the low-attenuation is accentuated by the beam hardening streak artifact from the patient's hair clips which could not be removed. No associated mass effect or midline shift currently. No associated hemorrhage or hematoma. No extra-axial fluid collections. No focal brain parenchymal abnormality elsewhere. Vascular: Mild BILATERAL carotid siphon atherosclerosis and mild LEFT vertebral artery atherosclerosis. No visible hyperdense vessel. Skull: No skull fracture or other focal osseous abnormality involving the skull. Sinuses/Orbits: Visualized paranasal sinuses, bilateral mastoid air cells and bilateral middle ear cavities well-aerated. Visualized orbits and globes normal in appearance. Other: None. IMPRESSION: 1. Likely acute nonhemorrhagic stroke involving the RIGHT posterior parietal lobe and RIGHT occipital lobe. 2. No mass effect or midline shift currently. I personally telephoned these results at the time of interpretation on 04/08/2021 at 4:05 pm to provider Jacalyn Lefevre, MD, who verbally acknowledged these results. Electronically Signed   By: Hulan Saas M.D.   On: 04/08/2021 16:09   MR ANGIO HEAD WO CONTRAST  Result Date: 04/08/2021 CLINICAL DATA:  Neuro deficit, acute, stroke suspected. EXAM: MRI HEAD WITHOUT CONTRAST MRA HEAD WITHOUT CONTRAST TECHNIQUE: Multiplanar, multi-echo pulse sequences of the brain and surrounding structures were acquired without intravenous contrast. Angiographic images of the Circle of Willis were acquired  using MRA technique without intravenous contrast. COMPARISON: No pertinent prior exam. COMPARISON:  Noncontrast head CT performed earlier today 70/14/2022. CT angiogram head/neck 03/07/2021. Brain MRI 12/19/2009. FINDINGS: MRI HEAD FINDINGS Brain: Mild generalized cerebral and cerebellar atrophy. Mild multifocal T2/FLAIR hyperintensity within the cerebral white matter, nonspecific but most often  secondary to chronic small vessel ischemia. Findings have progressed as compared to the brain MRI of 12/19/2009. 1.2 x 0.6 cm dural-based mass along the right aspect of the anterior falx, likely reflecting an incidental meningioma (series 70, image 20). There is no acute infarct. No chronic intracranial blood products. No extra-axial fluid collection. No midline shift. Vascular: Expected proximal arterial flow voids. Skull and upper cervical spine: No focal marrow lesion. Sinuses/Orbits: Visualized orbits show no acute finding. No significant paranasal sinus disease. MRA HEAD FINDINGS Anterior circulation: The intracranial internal carotid arteries are patent. The M1 middle cerebral arteries are patent. No M2 proximal branch occlusion or high-grade proximal stenosis is identified. The anterior cerebral arteries are patent. No intracranial aneurysm is identified. Posterior circulation: The visualized intracranial vertebral arteries are patent. The basilar artery is patent. The posterior cerebral arteries are patent. Posterior communicating arteries are hypoplastic or absent bilaterally Anatomic variants: As described IMPRESSION: MRI brain: 1. No evidence of acute infarction 2. 1.2 x 0.6 cm dural-based mass along the right aspect of the anterior falx, likely reflecting an incidental meningioma. 3. Mild generalized parenchymal atrophy and cerebral white matter chronic small vessel ischemic disease, progressed as compared to the brain MRI of 12/19/2009. MRA head: Unremarkable MR angiography of the head. No intracranial large vessel occlusion or proximal high-grade arterial stenosis. Electronically Signed   By: Kellie Simmering DO   On: 04/08/2021 18:41   MR BRAIN WO CONTRAST  Result Date: 04/08/2021 CLINICAL DATA:  Neuro deficit, acute, stroke suspected. EXAM: MRI HEAD WITHOUT CONTRAST MRA HEAD WITHOUT CONTRAST TECHNIQUE: Multiplanar, multi-echo pulse sequences of the brain and surrounding structures were acquired  without intravenous contrast. Angiographic images of the Circle of Willis were acquired using MRA technique without intravenous contrast. COMPARISON: No pertinent prior exam. COMPARISON:  Noncontrast head CT performed earlier today 70/14/2022. CT angiogram head/neck 03/07/2021. Brain MRI 12/19/2009. FINDINGS: MRI HEAD FINDINGS Brain: Mild generalized cerebral and cerebellar atrophy. Mild multifocal T2/FLAIR hyperintensity within the cerebral white matter, nonspecific but most often secondary to chronic small vessel ischemia. Findings have progressed as compared to the brain MRI of 12/19/2009. 1.2 x 0.6 cm dural-based mass along the right aspect of the anterior falx, likely reflecting an incidental meningioma (series 70, image 20). There is no acute infarct. No chronic intracranial blood products. No extra-axial fluid collection. No midline shift. Vascular: Expected proximal arterial flow voids. Skull and upper cervical spine: No focal marrow lesion. Sinuses/Orbits: Visualized orbits show no acute finding. No significant paranasal sinus disease. MRA HEAD FINDINGS Anterior circulation: The intracranial internal carotid arteries are patent. The M1 middle cerebral arteries are patent. No M2 proximal branch occlusion or high-grade proximal stenosis is identified. The anterior cerebral arteries are patent. No intracranial aneurysm is identified. Posterior circulation: The visualized intracranial vertebral arteries are patent. The basilar artery is patent. The posterior cerebral arteries are patent. Posterior communicating arteries are hypoplastic or absent bilaterally Anatomic variants: As described IMPRESSION: MRI brain: 1. No evidence of acute infarction 2. 1.2 x 0.6 cm dural-based mass along the right aspect of the anterior falx, likely reflecting an incidental meningioma. 3. Mild generalized parenchymal atrophy and cerebral white matter chronic small vessel ischemic disease, progressed as compared  to the brain MRI of  12/19/2009. MRA head: Unremarkable MR angiography of the head. No intracranial large vessel occlusion or proximal high-grade arterial stenosis. Electronically Signed   By: Kellie Simmering DO   On: 04/08/2021 18:41   VAS US CAROTID  Result Date: 04/09/2021 Carotid Arterial Duplex Study Patient Name:  [name]  Date of Exam:   04/09/2021 Medical Rec #: [id number]          Accession #:    [id number] Date of Birth: 13-May-1951          Patient Gender: F Patient Age:   70Y Exam Location:  [facility] Procedure:      VAS US CAROTID Referring Phys: [id number] JULIE HAVILAND --------------------------------------------------------------------------------  Indications:       TIA, Numbness and Facial droop. Risk Factors:      Hypertension, hyperlipidemia, Diabetes. Comparison Study:  No prior study on file Performing Technologist: Sharion Dove RVS  Examination Guidelines: A complete evaluation includes B-mode imaging, spectral Doppler, color Doppler, and power Doppler as needed of all accessible portions of each vessel. Bilateral testing is considered an integral part of a complete examination. Limited examinations for reoccurring indications may be performed as noted.  Right Carotid Findings: +----------+--------+--------+--------+------------------+------------------+           PSV cm/sEDV cm/sStenosisPlaque DescriptionComments           +----------+--------+--------+--------+------------------+------------------+ CCA Prox  86      18                                                   +----------+--------+--------+--------+------------------+------------------+ CCA Distal71      15                                intimal thickening +----------+--------+--------+--------+------------------+------------------+ ICA Prox  53      16              heterogenous                         +----------+--------+--------+--------+------------------+------------------+ ICA Distal82      24                                                    +----------+--------+--------+--------+------------------+------------------+ ECA       60      10                                                   +----------+--------+--------+--------+------------------+------------------+ +----------+--------+-------+--------+-------------------+           PSV cm/sEDV cmsDescribeArm Pressure (mmHG) +----------+--------+-------+--------+-------------------+ [id number]                                         +----------+--------+-------+--------+-------------------+ +---------+--------+--+--------+-+ VertebralPSV cm/s36EDV cm/s9 +---------+--------+--+--------+-+  Left Carotid Findings: +----------+--------+--------+--------+------------------+------------------+           PSV cm/sEDV cm/sStenosisPlaque DescriptionComments           +----------+--------+--------+--------+------------------+------------------+  CCA Prox  81      15                                                   +----------+--------+--------+--------+------------------+------------------+ CCA Distal54      12                                intimal thickening +----------+--------+--------+--------+------------------+------------------+ ICA Prox  56      16                                                   +----------+--------+--------+--------+------------------+------------------+ ICA Distal78      26                                                   +----------+--------+--------+--------+------------------+------------------+ ECA       57      10                                                   +----------+--------+--------+--------+------------------+------------------+ +----------+--------+--------+--------+-------------------+           PSV cm/sEDV cm/sDescribeArm Pressure (mmHG) +----------+--------+--------+--------+-------------------+ [id number]                                           +----------+--------+--------+--------+-------------------+ +---------+--------+--+--------+--+ VertebralPSV cm/s56EDV cm/s17 +---------+--------+--+--------+--+   Summary: Right Carotid: The extracranial vessels were near-normal with only minimal wall                thickening or plaque. Left Carotid: The extracranial vessels were near-normal with only minimal wall               thickening or plaque. Vertebrals:  Bilateral vertebral arteries demonstrate antegrade flow. Subclavians: Normal flow hemodynamics were seen in bilateral subclavian              arteries. *See table(s) above for measurements and observations.     Preliminary     PHYSICAL EXAM  General: Laying comfortably in bed; in no acute distress. HENT: Normal oropharynx and mucosa. Normal external appearance of ears and nose.  Neck: Supple, no pain or tenderness  CV: No JVD. No peripheral edema.  Pulmonary: Symmetric Chest rise. Normal respiratory effort.  Abdomen: Soft to touch, non-tender.  Ext: No cyanosis, edema, or deformity  Skin: No rash. Normal palpation of skin.   Musculoskeletal: Normal digits and nails by inspection. No clubbing.  Sensation: intact to light touch.   Coordination/Complex Motor:  - Finger to Nose intact BL - Heel to shin intact BL - Rapid alternating movement are normal - Gait: Deferred.    ASSESSMENT/PLAN [name] is a 70 y.o. female with history of  DM2, HTN, HLD, thyroid disease, ?seizures on keppra 500mg  BID who presents  with acute onset L facial droop and L arm weakness. Patient was at home around 1400, when she had acute onset symptoms. Symptoms resolved by the time EMS arrived with a NIHSS of 0. Patient remains hypertensive since admission.   TIA - right brain TIA likely   CT head  Possible right posterior and occipital lobe stroke.   MRI Head/MRA head    1. No evidence of acute infarction 2. 1.2 x 0.6 cm dural-based mass along the right aspect of the anterior  falx, likely reflecting an incidental meningioma. 3. Mild generalized parenchymal atrophy and cerebral white matter chronic small vessel ischemic disease, progressed as compared to the brain MRI of 12/19/2009.  MRA head: Unremarkable MR angiography of the head. No intracranial large vessel occlusion or proximal high-grade arterial stenosis.   Carotid Doppler   Antegrade flow bilateral vetebral arteries. Extracranial vessels were near normal   2D Echo done and results are pending.    Diagnosis most likely TIA: no further inpatient stroke workup indicated, and we will sign off at this time but remain available to assist. She needs to be on aspirin and plavix x 3 weeks followed by aspirin 325mg  daily.  She is to follow up with PCP regarding secondary stroke prevention, including tight blood pressure regulation, glycemic control, lipid control, and continued weight management.   LDL 83  HgbA1c 7.0  VTE prophylaxis -      Diet   Diet heart healthy/carb modified Room service appropriate? Yes; Fluid consistency: Thin     aspirin 325 mg daily prior to admission, now on aspirin 325 mg daily and clopidogrel 75 mg daily for 21 days followed by monoherapy  Therapy recommendations:  na  Disposition:  home  Hypertension  Home meds:   Losartan 50mg  qhs   Unstable on the high end  Gradually normalize BP in 2-3 days . Long-term BP goal normotensive . Ok to resume home antihypertension medication.   Hyperlipidemia  Home meds:  lipitor,  resumed in hospital  LDL 83, goal < 70  High intensity statin    Continue statin at discharge  Diabetes type II Controlled  Home meds:  metformin  HgbA1c 7.0, goal < 7.0  CBGs  SSI  Other Stroke Risk Factors   Advanced Age >/= 14    Migraines: on medications     Hospital day # 0  ATTENDING NOTE: I reviewed above note and agree with the assessment and plan. Pt was seen and examined.   70 year old female with history of  diabetes, hypertension, hyperlipidemia, seizure on Keppra and Onfi presented to ER for episode of left facial droop and left arm weakness.  Symptoms resolved on arrival.  CT initially concerning for right MCA infarct, however MRI showed no acute infarct.  MRA head negative.  Carotid Doppler unremarkable.  EF 60 to 65%.  LDL and A1c pending.  Creatinine 0.71.  Patient neurological examination intact except not orientated to year, however able to tell months.  Per patient, she has been having memory difficulty for quite some time.  Recommend outpatient neuropsych testing.  Etiology for patient current symptoms concerning for TIA given risk factors.  Currently on aspirin 81 and Plavix 75 DAPT for 3 weeks and then Plavix alone.  Continue Lipitor 40.  BP on the higher end, okay to gradually normalize BP in 2 to 3 days.  Long term BP goal normotensive.  For detailed assessment and plan, please refer to above as I have made changes wherever appropriate.   Neurology  will sign off. Please call with questions. Pt will follow up with stroke clinic NP at [facility] in about 4 weeks. Thanks for the consult.   Rosalin Hawking, MD PhD Stroke Neurology 04/09/2021 3:41 PM     To contact Stroke Continuity provider, please refer to [email or web address] After hours, contact General Neurology

## 2021-04-09 NOTE — Progress Notes (Signed)
  Echocardiogram 2D Echocardiogram has been performed.  Merrie Roof F 04/09/2021, 8:19 AM

## 2021-04-09 NOTE — Progress Notes (Signed)
**  Late Entry**  Patient's EEG returned normal.  Okay to discharge.  Called patient's husband Izell [place], who stated that he was at bedside and comfortable taking patient home tonight.  Patient and her husband seen at bedside, discussed likely TIA diagnosis and d/c instructions, specifically DAPT x3 weeks then Plavix alone with neuro f/u in 4 weeks.  She will see me for an appointment on 5/17 at 1:50pm.  Discharge orders placed.

## 2021-04-09 NOTE — Care Management Obs Status (Signed)
[place] NOTIFICATION   Patient Details  Name: [name] MRN: [id number] Date of Birth: 03-Nov-1951   Medicare Observation Status Notification Given:  Yes    Carles Collet, RN 04/09/2021, 4:53 PM

## 2021-04-09 NOTE — Progress Notes (Signed)
Patient is a new admission to room 2W04. Patient is oriented to self and place but disoriented to time and situation. Husband at bedside. Patient able to verbalize her needs to staff. VS checked and cardiac monitor applied. No skin issues noted. All needs met at this time.

## 2021-04-09 NOTE — Evaluation (Signed)
Speech Language Pathology Evaluation Patient Details Name: [name] MRN: [id number] DOB: 12/26/1950 Today's Date: 04/09/2021 Time: 7989-2119 SLP Time Calculation (min) (ACUTE ONLY): 25 min  Problem List:  Patient Active Problem List   Diagnosis Date Noted  . CVA (cerebral vascular accident) ([place]) 04/08/2021  . TIA (transient ischemic attack) 04/08/2021  . Tension headache 03/01/2021  . Hypokalemia 09/21/2020  . Cough due to ACE inhibitor 06/16/2020  . 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 Diabetes mellitus ([place]) 04/21/2015  . Hx of adenomatous colonic polyps 01/26/2015  . Seizure ([place]) 02/23/2010  . Disturbance in sleep behavior 01/05/2010  . Depression 09/13/2009  . Anxiety state 09/03/2007  . Hypothyroidism 01/23/2007  . Hyperlipidemia 01/23/2007  . Cognitive impairment 01/23/2007  . HYPERTENSION, BENIGN SYSTEMIC 01/23/2007   Past Medical History:  Past Medical History:  Diagnosis Date  . Allergy   . Anxiety   . Arthritis   . Diabetes mellitus without complication ([place])    on metformin  . Hx of adenomatous colonic polyps 01/26/2015   and also 2021 - recall 2024  . Hyperlipidemia   . Hypertension   . Memory loss of unknown cause 03/04/2002   suspect depression with psychotic features. patient hospitalized and work-up negative.   . Panic attack    5-6 a day per pt and husband   . Seizures ([place])    per husband last seizure 3 yeras ago- documented 06-29-2020  . Thyroid disease    thyroid surgery when young   Past Surgical History:  Past Surgical History:  Procedure Laterality Date  . BUNIONECTOMY Bilateral   . COLONOSCOPY    . THYROIDECTOMY, PARTIAL    . TONSILLECTOMY     HPI:  70 y.o. female presenting to ED with slurred speech and L sided weakness. MRI with no acute finding, showing a 1.2 x 0.6 dural based mass, likely meningioma present.  PMH includes hypertension, type 2 diabetes, thyroid disease s/p thyroidectomy, hyperlipidemia, seizures, migraines, anxiety.    Assessment / Plan / Recommendation Clinical Impression  Pt was seen for a cognitive-linguistic evaluation and she presents with cognitive deficits in the areas of short-term memory, attention, and complex problem solving.  Expressive and receptive language were functional, and no dysarthria was observed. Pt's husband was present and he provided baseline information in addition to the pt.  Per report, pt lives at home with her husband and she has baseline short-term memory deficits.  Husband reported that he takes care of the pt's medication management and the couple's financial management.  Recommended home health or outpatient speech therapy at time of discharge; however, pt reported that she previously had speech therapy for cognitive deficits years ago, and that neurologist at [facility] stated that therapy would likely not be helpful given the pt's deficits and prognosis.  Therefore, no additional ST recommended at this time per pt/family preference.  Recommended continued assistance with IADLs and pt/husband verbalized understanding.  SLP is signing off at this time, please re-consult if additional needs arise.    SLP Assessment  SLP Recommendation/Assessment: Patient needs continued Speech Lanaguage Pathology Services SLP Visit Diagnosis: Cognitive communication deficit (R41.841)    Follow Up Recommendations  None (per pt/family preference)          SLP Evaluation Cognition  Overall Cognitive Status: Impaired/Different from baseline Orientation Level: Oriented to person;Oriented to place;Oriented to time;Disoriented to situation Attention: Focused;Sustained Focused Attention: Impaired Focused Attention Impairment: Verbal basic Sustained Attention: Impaired Sustained Attention Impairment: Verbal basic Memory: Impaired Memory Impairment: Decreased short term  memory Decreased Short Term Memory: Verbal basic Immediate Memory Recall: Sock;Blue;Bed Memory Recall Sock: Not able to  recall Memory Recall Blue: With Cue Memory Recall Bed: With Cue Awareness: Appears intact Problem Solving: Appears intact Safety/Judgment: Appears intact       Comprehension  Auditory Comprehension Overall Auditory Comprehension: Appears within functional limits for tasks assessed    Expression Verbal Expression Overall Verbal Expression: Appears within functional limits for tasks assessed   Oral / Motor  Oral Motor/Sensory Function Overall Oral Motor/Sensory Function: Within functional limits Motor Speech Overall Motor Speech: Appears within functional limits for tasks assessed   GO                   Colin Mulders., M.S., [place] Office: [phone]  [place] 04/09/2021, 3:53 PM

## 2021-04-09 NOTE — ED Notes (Signed)
Attempted to give report x2 

## 2021-04-09 NOTE — Discharge Summary (Addendum)
[facility] Discharge Summary  Patient name: [name] Medical record number: [id number] Date of birth: 1951-04-17 Age: 70 y.o. Gender: female Date of Admission: 04/08/2021  Date of Discharge: 04/09/2021 Admitting Physician: Lenoria Chime, MD  Primary Care Provider: Cleophas Dunker, DO Consultants: Neurology  Indication for Hospitalization: TIA  Discharge Diagnoses/Problem List:  Active Problems:   CVA (cerebral vascular accident) [facility])   TIA (transient ischemic attack)    Disposition: Home  Discharge Condition: Stable  Discharge Exam:   Physical Exam: General: Alert and oriented in no apparent distress Heart: Regular rate and rhythm with no murmurs appreciated Lungs: CTA bilaterally, no wheezing Neuro: CN II through XII intact, fine touch sensation intact in upper and lower extremities bilaterally, strength 5/5 in upper and lower extremities bilaterally Skin: Warm and dry  Brief Hospital Course:  [name] is a 70 y.o. female presenting with slurred speech and left sided weakness. PMH is significant for hypertension, type 2 diabetes, thyroid disease s/p thyroidectomy, hyperlipidemia, seizures, migraines, anxiety.   TIA Patient presenting with new onset on slurred speech and left sided weakness. Symptoms resolved within 81mins of onset. CT Head remarkable for likely acute nonhemorrhagic stroke involving the RIGHT posterior parietal lobe and RIGHT occipital lobe. Home medications include aspirin.  Neurology was brought on board, however an MRI was performed which showed no acute stroke. MRA head was negative, carotid dopplers were unremarkable.  Echo showed EF of 60-65%.  Per Neurology recommendations include that symptoms suggest TIA, therefore, DAPT x 3 weeks, then plavix alone, f/u outpatient in 4 weeks.  Also recommends outpatient neuropsych testing given her memory concerns.  At the time of discharge, vitals were stable, and  she was at neurologic baseline.  HTN BP at admission was 206/88. We will hold home medications in setting of acute stroke for permissive hypertension with goal of [id number] systolic and [id number] diastolic, however this was before the MRI was completed which showed no acute stroke.  Home medications include losartan, which was restarted prior to discharge.    T2DM Last HbA1C in 01/22 was 7.0.   Seizures Patient with a history of seizures and is maintained on Keppra 1500 twice daily as well as Onfi 10 mg daily.  EEG was within normal limits during her hospitalization.   PCP follow-up task: -Recommend discussion on patient's clonazepam, per the daughter it seems the patient uses this twice daily even though it is supposed to be as needed twice daily -Recheck BMP, K was low on day of discharge and was repleted -F/U neurology in 4 weeks. -DAPT with ASA 81 and Plavix 75 x3 weeks, then ASA alone -Consider Neuropsych referral for memory concerns -F/U A1c and Lipid Panel    Significant Procedures: none  Significant Labs and Imaging:  Recent Labs  Lab 04/08/21 1525 04/09/21 0443  WBC 4.3 4.1  HGB 11.3* 10.6*  HCT 34.8* 33.5*  PLT 257 259   Recent Labs  Lab 04/08/21 1525 04/09/21 0443  NA 140 139  K 3.8 3.2*  CL 106 106  CO2 26 25  GLUCOSE 95 98  BUN 7* <5*  CREATININE 0.71 0.54  CALCIUM 8.9 8.7*  ALKPHOS 57  --   AST 21  --   ALT 19  --   ALBUMIN 3.7  --     CT HEAD WO CONTRAST  Result Date: 04/08/2021 CLINICAL DATA:  70 year old presenting with acute onset of headache and dizziness. Acute onset of slurred speech, LEFT UPPER EXTREMITY  weakness and LEFT-sided facial droop that was initially noticed by the family at 2 o'clock p.m. today. Current history of hypertension. EXAM: CT HEAD WITHOUT CONTRAST TECHNIQUE: Contiguous axial images were obtained from the base of the skull through the vertex without intravenous contrast. COMPARISON:  03/07/2021 and earlier.  MRI brain 12/19/2009.  FINDINGS: Brain: Geographic low attenuation involving the RIGHT posterior parietal lobe and RIGHT occipital lobe, new since the CT 1 month ago; the low-attenuation is accentuated by the beam hardening streak artifact from the patient's hair clips which could not be removed. No associated mass effect or midline shift currently. No associated hemorrhage or hematoma. No extra-axial fluid collections. No focal brain parenchymal abnormality elsewhere. Vascular: Mild BILATERAL carotid siphon atherosclerosis and mild LEFT vertebral artery atherosclerosis. No visible hyperdense vessel. Skull: No skull fracture or other focal osseous abnormality involving the skull. Sinuses/Orbits: Visualized paranasal sinuses, bilateral mastoid air cells and bilateral middle ear cavities well-aerated. Visualized orbits and globes normal in appearance. Other: None. IMPRESSION: 1. Likely acute nonhemorrhagic stroke involving the RIGHT posterior parietal lobe and RIGHT occipital lobe. 2. No mass effect or midline shift currently. I personally telephoned these results at the time of interpretation on 04/08/2021 at 4:05 pm to provider [name], MD, who verbally acknowledged these results. Electronically Signed   By: Evangeline Dakin M.D.   On: 04/08/2021 16:09   MR ANGIO HEAD WO CONTRAST  Result Date: 04/08/2021 CLINICAL DATA:  Neuro deficit, acute, stroke suspected. EXAM: MRI HEAD WITHOUT CONTRAST MRA HEAD WITHOUT CONTRAST TECHNIQUE: Multiplanar, multi-echo pulse sequences of the brain and surrounding structures were acquired without intravenous contrast. Angiographic images of the Circle of Willis were acquired using MRA technique without intravenous contrast. COMPARISON: No pertinent prior exam. COMPARISON:  Noncontrast head CT performed earlier today 04/08/2021. CT angiogram head/neck 03/07/2021. Brain MRI 12/19/2009. FINDINGS: MRI HEAD FINDINGS Brain: Mild generalized cerebral and cerebellar atrophy. Mild multifocal T2/FLAIR  hyperintensity within the cerebral white matter, nonspecific but most often secondary to chronic small vessel ischemia. Findings have progressed as compared to the brain MRI of 12/19/2009. 1.2 x 0.6 cm dural-based mass along the right aspect of the anterior falx, likely reflecting an incidental meningioma (series 6, image 20). There is no acute infarct. No chronic intracranial blood products. No extra-axial fluid collection. No midline shift. Vascular: Expected proximal arterial flow voids. Skull and upper cervical spine: No focal marrow lesion. Sinuses/Orbits: Visualized orbits show no acute finding. No significant paranasal sinus disease. MRA HEAD FINDINGS Anterior circulation: The intracranial internal carotid arteries are patent. The M1 middle cerebral arteries are patent. No M2 proximal branch occlusion or high-grade proximal stenosis is identified. The anterior cerebral arteries are patent. No intracranial aneurysm is identified. Posterior circulation: The visualized intracranial vertebral arteries are patent. The basilar artery is patent. The posterior cerebral arteries are patent. Posterior communicating arteries are hypoplastic or absent bilaterally Anatomic variants: As described IMPRESSION: MRI brain: 1. No evidence of acute infarction 2. 1.2 x 0.6 cm dural-based mass along the right aspect of the anterior falx, likely reflecting an incidental meningioma. 3. Mild generalized parenchymal atrophy and cerebral white matter chronic small vessel ischemic disease, progressed as compared to the brain MRI of 12/19/2009. MRA head: Unremarkable MR angiography of the head. No intracranial large vessel occlusion or proximal high-grade arterial stenosis. Electronically Signed   By: Kellie Simmering DO   On: 04/08/2021 18:41   MR BRAIN WO CONTRAST  Result Date: 04/08/2021 CLINICAL DATA:  Neuro deficit, acute, stroke suspected. EXAM: MRI HEAD  WITHOUT CONTRAST MRA HEAD WITHOUT CONTRAST TECHNIQUE: Multiplanar, multi-echo  pulse sequences of the brain and surrounding structures were acquired without intravenous contrast. Angiographic images of the Circle of Willis were acquired using MRA technique without intravenous contrast. COMPARISON: No pertinent prior exam. COMPARISON:  Noncontrast head CT performed earlier today 04/08/2021. CT angiogram head/neck 03/07/2021. Brain MRI 12/19/2009. FINDINGS: MRI HEAD FINDINGS Brain: Mild generalized cerebral and cerebellar atrophy. Mild multifocal T2/FLAIR hyperintensity within the cerebral white matter, nonspecific but most often secondary to chronic small vessel ischemia. Findings have progressed as compared to the brain MRI of 12/19/2009. 1.2 x 0.6 cm dural-based mass along the right aspect of the anterior falx, likely reflecting an incidental meningioma (series 6, image 20). There is no acute infarct. No chronic intracranial blood products. No extra-axial fluid collection. No midline shift. Vascular: Expected proximal arterial flow voids. Skull and upper cervical spine: No focal marrow lesion. Sinuses/Orbits: Visualized orbits show no acute finding. No significant paranasal sinus disease. MRA HEAD FINDINGS Anterior circulation: The intracranial internal carotid arteries are patent. The M1 middle cerebral arteries are patent. No M2 proximal branch occlusion or high-grade proximal stenosis is identified. The anterior cerebral arteries are patent. No intracranial aneurysm is identified. Posterior circulation: The visualized intracranial vertebral arteries are patent. The basilar artery is patent. The posterior cerebral arteries are patent. Posterior communicating arteries are hypoplastic or absent bilaterally Anatomic variants: As described IMPRESSION: MRI brain: 1. No evidence of acute infarction 2. 1.2 x 0.6 cm dural-based mass along the right aspect of the anterior falx, likely reflecting an incidental meningioma. 3. Mild generalized parenchymal atrophy and cerebral white matter chronic small  vessel ischemic disease, progressed as compared to the brain MRI of 12/19/2009. MRA head: Unremarkable MR angiography of the head. No intracranial large vessel occlusion or proximal high-grade arterial stenosis. Electronically Signed   By: Kellie Simmering DO   On: 04/08/2021 18:41   EEG adult  Result Date: 04/09/2021 Lora Havens, MD     04/09/2021  6:52 PM Patient Name: KANISE GLACE MRN: [id number] Epilepsy Attending: Lora Havens Referring Physician/Provider: Dr Donnetta Simpers Date: 04/09/2021 Duration: 26.37 mins Patient history: 70 y.o. female with PMH significant for DM2, HTN, HLD, thyroid disease, ?seizures on keppra 500mg  BID who presents with acute onset L facial droop and L arm weakness Symptoms resolved. EEG to evaluate for seizure. Level of alertness: Awake AEDs during EEG study: LEV, Onfi Technical aspects: This EEG study was done with scalp electrodes positioned according to the 10-20 International system of electrode placement. Electrical activity was acquired at a sampling rate of 500Hz  and reviewed with a high frequency filter of 70Hz  and a low frequency filter of 1Hz . EEG data were recorded continuously and digitally stored. Description: The posterior dominant rhythm consists of 9-10 Hz activity of moderate voltage (25-35 uV) seen predominantly in posterior head regions, symmetric and reactive to eye opening and eye closing.  Hyperventilation and photic stimulation were not performed.   IMPRESSION: This study is within normal limits. No seizures or epileptiform discharges were seen throughout the recording. Lora Havens   ECHOCARDIOGRAM COMPLETE  Result Date: 04/09/2021    ECHOCARDIOGRAM REPORT   Patient Name:   NESIAH HATHWAY [facility] Date of Exam: 04/09/2021 Medical Rec #:  [id number]         Height:       63.0 in Accession #:    [id number]        Weight:       150.0 lb Date  of Birth:  1951/02/09         BSA:          54.711 m Patient Age:    38 years          BP:           187/81  mmHg Patient Gender: F                 HR:           64 bpm. Exam Location:  Inpatient Procedure: 2D Echo, Cardiac Doppler and Color Doppler Indications:    Stroke I63.9  History:        Patient has no prior history of Echocardiogram examinations.  Sonographer:    Merrie Roof Referring Phys: [phone] [place]  1. Left ventricular ejection fraction, by estimation, is 60 to 65%. The left ventricle has normal function. The left ventricle has no regional wall motion abnormalities. Left ventricular diastolic parameters are consistent with Grade I diastolic dysfunction (impaired relaxation).  2. Right ventricular systolic function is normal. The right ventricular size is normal. There is normal pulmonary artery systolic pressure. The estimated right ventricular systolic pressure is 83.1 mmHg.  3. Left atrial size was mildly dilated.  4. The mitral valve is normal in structure. Mild mitral valve regurgitation. No evidence of mitral stenosis.  5. Tricuspid valve regurgitation is moderate.  6. The aortic valve is tricuspid. Aortic valve regurgitation is trivial. Mild aortic valve sclerosis is present, with no evidence of aortic valve stenosis.  7. The inferior vena cava is normal in size with greater than 50% respiratory variability, suggesting right atrial pressure of 3 mmHg.  8. Cannot rule out PFO, recommend agitated saline contrast study. FINDINGS  Left Ventricle: Left ventricular ejection fraction, by estimation, is 60 to 65%. The left ventricle has normal function. The left ventricle has no regional wall motion abnormalities. The left ventricular internal cavity size was normal in size. There is  no left ventricular hypertrophy. Left ventricular diastolic parameters are consistent with Grade I diastolic dysfunction (impaired relaxation). Right Ventricle: The right ventricular size is normal. No increase in right ventricular wall thickness. Right ventricular systolic function is normal. There is normal  pulmonary artery systolic pressure. The tricuspid regurgitant velocity is 2.76 m/s, and  with an assumed right atrial pressure of 3 mmHg, the estimated right ventricular systolic pressure is 51.7 mmHg. Left Atrium: Left atrial size was mildly dilated. Right Atrium: Right atrial size was normal in size. Pericardium: There is no evidence of pericardial effusion. Mitral Valve: The mitral valve is normal in structure. Mild mitral valve regurgitation. No evidence of mitral valve stenosis. Tricuspid Valve: The tricuspid valve is normal in structure. Tricuspid valve regurgitation is moderate . No evidence of tricuspid stenosis. Aortic Valve: The aortic valve is tricuspid. Aortic valve regurgitation is trivial. Mild aortic valve sclerosis is present, with no evidence of aortic valve stenosis. Aortic valve mean gradient measures 2.0 mmHg. Aortic valve peak gradient measures 4.5 mmHg. Aortic valve area, by VTI measures 2.28 cm. Pulmonic Valve: The pulmonic valve was normal in structure. Pulmonic valve regurgitation is trivial. No evidence of pulmonic stenosis. Aorta: The aortic root is normal in size and structure. Venous: The inferior vena cava is normal in size with greater than 50% respiratory variability, suggesting right atrial pressure of 3 mmHg. IAS/Shunts: Cannot rule out PFO, recommend agitated saline contrast study.  LEFT VENTRICLE PLAX 2D LVIDd:         4.30 cm  Diastology LVIDs:         3.00 cm  LV e' medial:  3.26 cm/s LV PW:         1.10 cm  LV e' lateral: 4.24 cm/s LV IVS:        1.00 cm LVOT diam:     1.90 cm LV SV:         57 LV SV Index:   33 LVOT Area:     2.84 cm  RIGHT VENTRICLE          IVC RV Basal diam:  3.30 cm  IVC diam: 1.50 cm LEFT ATRIUM             Index       RIGHT ATRIUM           Index LA diam:        4.10 cm 2.40 cm/m  RA Area:     17.30 cm LA Vol (A2C):   60.3 ml 35.24 ml/m RA Volume:   49.30 ml  28.81 ml/m LA Vol (A4C):   77.5 ml 45.29 ml/m LA Biplane Vol: 69.5 ml 40.62 ml/m  AORTIC  VALVE AV Area (Vmax):    2.31 cm AV Area (Vmean):   2.31 cm AV Area (VTI):     2.28 cm AV Vmax:           106.00 cm/s AV Vmean:          67.800 cm/s AV VTI:            0.250 m AV Peak Grad:      4.5 mmHg AV Mean Grad:      2.0 mmHg LVOT Vmax:         86.50 cm/s LVOT Vmean:        55.300 cm/s LVOT VTI:          0.201 m LVOT/AV VTI ratio: 0.80  AORTA Ao Root diam: 2.80 cm Ao Asc diam:  3.30 cm TRICUSPID VALVE TR Peak grad:   30.5 mmHg TR Vmax:        276.00 cm/s  SHUNTS Systemic VTI:  0.20 m Systemic Diam: 1.90 cm Fransico Him MD Electronically signed by Fransico Him MD Signature Date/Time: 04/09/2021/12:04:08 PM    Final    VAS US CAROTID  Result Date: 04/09/2021 Carotid Arterial Duplex Study Patient Name:  NOOR BRANIGAN [facility]  Date of Exam:   04/09/2021 Medical Rec #: [id number]          Accession #:    [id number] Date of Birth: Apr 24, 1951          Patient Gender: F Patient Age:   70Y Exam Location:  [facility] Procedure:      VAS US CAROTID Referring Phys: [id number] JULIE HAVILAND --------------------------------------------------------------------------------  Indications:       TIA, Numbness and Facial droop. Risk Factors:      Hypertension, hyperlipidemia, Diabetes. Comparison Study:  No prior study on file Performing Technologist: Sharion Dove RVS  Examination Guidelines: A complete evaluation includes B-mode imaging, spectral Doppler, color Doppler, and power Doppler as needed of all accessible portions of each vessel. Bilateral testing is considered an integral part of a complete examination. Limited examinations for reoccurring indications may be performed as noted.  Right Carotid Findings: +----------+--------+--------+--------+------------------+------------------+           PSV cm/sEDV cm/sStenosisPlaque DescriptionComments           +----------+--------+--------+--------+------------------+------------------+ CCA Prox  86      18                                                    +----------+--------+--------+--------+------------------+------------------+  CCA Distal71      15                                intimal thickening +----------+--------+--------+--------+------------------+------------------+ ICA Prox  53      16              heterogenous                         +----------+--------+--------+--------+------------------+------------------+ ICA Distal82      24                                                   +----------+--------+--------+--------+------------------+------------------+ ECA       60      10                                                   +----------+--------+--------+--------+------------------+------------------+ +----------+--------+-------+--------+-------------------+           PSV cm/sEDV cmsDescribeArm Pressure (mmHG) +----------+--------+-------+--------+-------------------+ [id number]                                         +----------+--------+-------+--------+-------------------+ +---------+--------+--+--------+-+ VertebralPSV cm/s36EDV cm/s9 +---------+--------+--+--------+-+  Left Carotid Findings: +----------+--------+--------+--------+------------------+------------------+           PSV cm/sEDV cm/sStenosisPlaque DescriptionComments           +----------+--------+--------+--------+------------------+------------------+ CCA Prox  81      15                                                   +----------+--------+--------+--------+------------------+------------------+ CCA Distal54      12                                intimal thickening +----------+--------+--------+--------+------------------+------------------+ ICA Prox  56      16                                                   +----------+--------+--------+--------+------------------+------------------+ ICA Distal78      26                                                    +----------+--------+--------+--------+------------------+------------------+ ECA       57      10                                                   +----------+--------+--------+--------+------------------+------------------+ +----------+--------+--------+--------+-------------------+  PSV cm/sEDV cm/sDescribeArm Pressure (mmHG) +----------+--------+--------+--------+-------------------+ [id number]                                          +----------+--------+--------+--------+-------------------+ +---------+--------+--+--------+--+ VertebralPSV cm/s56EDV cm/s17 +---------+--------+--+--------+--+   Summary: Right Carotid: The extracranial vessels were near-normal with only minimal wall                thickening or plaque. Left Carotid: The extracranial vessels were near-normal with only minimal wall               thickening or plaque. Vertebrals:  Bilateral vertebral arteries demonstrate antegrade flow. Subclavians: Normal flow hemodynamics were seen in bilateral subclavian              arteries. *See table(s) above for measurements and observations.     Preliminary    Results/Tests Pending at Time of Discharge: A1c, Lipid Panel  Discharge Medications:  Allergies as of 04/09/2021      Reactions   Chocolate Hives   Coffee Bean Extract [coffea Arabica] Rash   Peanut-containing Drug Products Rash   Possible allergy      Medication List    STOP taking these medications   ibuprofen 200 MG tablet Commonly known as: ADVIL   meclizine 12.5 MG tablet Commonly known as: ANTIVERT     TAKE these medications   aspirin EC 81 MG tablet Take 1 tablet (81 mg total) by mouth daily for 21 days.   atorvastatin 40 MG tablet Commonly known as: LIPITOR TAKE ONE TABLET BY MOUTH DAILY AT 6 PM. What changed: See the new instructions.   baclofen 10 MG tablet Commonly known as: LIORESAL Take 1 tablet (10 mg total) by mouth 3 (three) times daily.   citalopram 20 MG  tablet Commonly known as: CELEXA Take 1 tablet by mouth once daily   cloBAZam 10 MG tablet Commonly known as: ONFI Take 10 mg by mouth at bedtime.   clonazePAM 0.5 MG tablet Commonly known as: KLONOPIN Take 1 tablet by mouth twice daily as needed for anxiety What changed:   when to take this  additional instructions   clopidogrel 75 MG tablet Commonly known as: PLAVIX Take 1 tablet (75 mg total) by mouth daily. Start taking on: Apr 10, 2021   EXCEDRIN TENSION HEADACHE PO Take 2 tablets by mouth daily as needed (headache).   hydrOXYzine 25 MG capsule Commonly known as: VISTARIL TAKE 1 CAPSULE BY MOUTH TWICE DAILY AS NEEDED What changed: See the new instructions.   levETIRAcetam 500 MG tablet Commonly known as: KEPPRA Take 3 tablets (1,500 mg total) by mouth 2 (two) times daily.   levocetirizine 5 MG tablet Commonly known as: XYZAL Take 5 mg by mouth every evening.   levothyroxine 75 MCG tablet Commonly known as: Euthyrox Take 1 tablet (75 mcg total) by mouth daily before breakfast.   losartan 50 MG tablet Commonly known as: COZAAR TAKE 1 TABLET BY MOUTH AT BEDTIME What changed: when to take this   metFORMIN 500 MG tablet Commonly known as: GLUCOPHAGE TAKE 1 TABLET BY MOUTH TWICE DAILY WITH A MEAL   QUEtiapine 50 MG tablet Commonly known as: SEROQUEL TAKE 1/2 (ONE-HALF) TABLET BY MOUTH AT BEDTIME What changed: See the new instructions.   SUMAtriptan 25 MG tablet Commonly known as: IMITREX Take 1 tablet (25 mg total) by mouth every 2 (two) hours as needed for migraine. May repeat in  2 hours if headache persists or recurs.       Discharge Instructions: Please refer to Patient Instructions section of EMR for full details.  Patient was counseled important signs and symptoms that should prompt return to medical care, changes in medications, dietary instructions, activity restrictions, and follow up appointments.   Follow-Up Appointments:  Follow-up  Information    [facility]. Schedule an appointment as soon as possible for a visit in 4 week(s).   Specialty: Neurology Contact information: [address] [place] [phone]       Cleophas Dunker, DO Follow up on 04/11/2021.   Specialty: Family Medicine Why: Please go to your scheduled appointment at 1:50. Please arrive at least 15 minute early. Contact information: [address] [place] [address] [phone]               Cleophas Dunker, DO 04/09/2021, 8:26 PM PGY-3, [place]

## 2021-04-09 NOTE — Progress Notes (Signed)
VASCULAR LAB    Carotid duplex has been performed.  See CV proc for preliminary results.   Korie Streat, RVT 04/09/2021, 7:42 AM

## 2021-04-09 NOTE — Procedures (Signed)
Patient Name: [name]  MRN: [id number]  Epilepsy Attending: Lora Havens  Referring Physician/Provider: Dr Donnetta Simpers Date: 04/09/2021 Duration: 26.37 mins  Patient history: 70 y.o. female with PMH significant for DM2, HTN, HLD, thyroid disease, ?seizures on keppra 500mg  BID who presents with acute onset L facial droop and L arm weakness Symptoms resolved. EEG to evaluate for seizure.  Level of alertness: Awake  AEDs during EEG study: LEV, Onfi  Technical aspects: This EEG study was done with scalp electrodes positioned according to the 10-20 International system of electrode placement. Electrical activity was acquired at a sampling rate of 500Hz  and reviewed with a high frequency filter of 70Hz  and a low frequency filter of 1Hz . EEG data were recorded continuously and digitally stored.   Description: The posterior dominant rhythm consists of 9-10 Hz activity of moderate voltage (25-35 uV) seen predominantly in posterior head regions, symmetric and reactive to eye opening and eye closing.  Hyperventilation and photic stimulation were not performed.     IMPRESSION: This study is within normal limits. No seizures or epileptiform discharges were seen throughout the recording.  [name] [name]

## 2021-04-09 NOTE — ED Notes (Signed)
Paged provider. Pt would like an increase dose of Tylenol 1000mg 

## 2021-04-09 NOTE — Plan of Care (Signed)
  Problem: Education: Goal: Knowledge of General Education information will improve Description: Including pain rating scale, medication(s)/side effects and non-pharmacologic comfort measures Outcome: Not Progressing   Problem: Health Behavior/Discharge Planning: Goal: Ability to manage health-related needs will improve Outcome: Not Progressing   Problem: Clinical Measurements: Goal: Ability to maintain clinical measurements within normal limits will improve Outcome: Not Progressing Goal: Will remain free from infection Outcome: Not Progressing Goal: Diagnostic test results will improve Outcome: Not Progressing Goal: Respiratory complications will improve Outcome: Not Progressing Goal: Cardiovascular complication will be avoided Outcome: Not Progressing   Problem: Activity: Goal: Risk for activity intolerance will decrease Outcome: Not Progressing   Problem: Nutrition: Goal: Adequate nutrition will be maintained Outcome: Not Progressing   Problem: Coping: Goal: Level of anxiety will decrease Outcome: Not Progressing   Problem: Elimination: Goal: Will not experience complications related to bowel motility Outcome: Not Progressing Goal: Will not experience complications related to urinary retention Outcome: Not Progressing   Problem: Pain Managment: Goal: General experience of comfort will improve Outcome: Not Progressing   Problem: Safety: Goal: Ability to remain free from injury will improve Outcome: Not Progressing   Problem: Skin Integrity: Goal: Risk for impaired skin integrity will decrease Outcome: Not Progressing   Problem: Education: Goal: Knowledge of disease or condition will improve Outcome: Not Progressing Goal: Knowledge of secondary prevention will improve Outcome: Not Progressing Goal: Knowledge of patient specific risk factors addressed and post discharge goals established will improve Outcome: Not Progressing Goal: Individualized Educational  Video(s) Outcome: Not Progressing   Problem: Coping: Goal: Will verbalize positive feelings about self Outcome: Not Progressing Goal: Will identify appropriate support needs Outcome: Not Progressing   Problem: Health Behavior/Discharge Planning: Goal: Ability to manage health-related needs will improve Outcome: Not Progressing   Problem: Self-Care: Goal: Ability to participate in self-care as condition permits will improve Outcome: Not Progressing Goal: Verbalization of feelings and concerns over difficulty with self-care will improve Outcome: Not Progressing Goal: Ability to communicate needs accurately will improve Outcome: Not Progressing   Problem: Nutrition: Goal: Risk of aspiration will decrease Outcome: Not Progressing Goal: Dietary intake will improve Outcome: Not Progressing   Problem: Intracerebral Hemorrhage Tissue Perfusion: Goal: Complications of Intracerebral Hemorrhage will be minimized Outcome: Not Progressing   Problem: Ischemic Stroke/TIA Tissue Perfusion: Goal: Complications of ischemic stroke/TIA will be minimized Outcome: Not Progressing   Problem: Spontaneous Subarachnoid Hemorrhage Tissue Perfusion: Goal: Complications of Spontaneous Subarachnoid Hemorrhage will be minimized Outcome: Not Progressing

## 2021-04-11 ENCOUNTER — Ambulatory Visit (INDEPENDENT_AMBULATORY_CARE_PROVIDER_SITE_OTHER): Payer: Medicare Other | Admitting: Family Medicine

## 2021-04-11 ENCOUNTER — Other Ambulatory Visit: Payer: Self-pay

## 2021-04-11 VITALS — BP 147/86 | HR 81 | Ht 63.0 in | Wt 159.2 lb

## 2021-04-11 DIAGNOSIS — I639 Cerebral infarction, unspecified: Secondary | ICD-10-CM | POA: Diagnosis not present

## 2021-04-11 DIAGNOSIS — E119 Type 2 diabetes mellitus without complications: Secondary | ICD-10-CM | POA: Diagnosis present

## 2021-04-11 DIAGNOSIS — Z09 Encounter for follow-up examination after completed treatment for conditions other than malignant neoplasm: Secondary | ICD-10-CM | POA: Diagnosis not present

## 2021-04-11 DIAGNOSIS — R519 Headache, unspecified: Secondary | ICD-10-CM | POA: Diagnosis not present

## 2021-04-11 DIAGNOSIS — Z8673 Personal history of transient ischemic attack (TIA), and cerebral infarction without residual deficits: Secondary | ICD-10-CM | POA: Diagnosis not present

## 2021-04-11 DIAGNOSIS — R4189 Other symptoms and signs involving cognitive functions and awareness: Secondary | ICD-10-CM

## 2021-04-11 DIAGNOSIS — G43909 Migraine, unspecified, not intractable, without status migrainosus: Secondary | ICD-10-CM

## 2021-04-11 HISTORY — DX: Headache, unspecified: R51.9

## 2021-04-11 LAB — POCT GLYCOSYLATED HEMOGLOBIN (HGB A1C): HbA1c, POC (controlled diabetic range): 6.1 % (ref 0.0–7.0)

## 2021-04-11 MED ORDER — TOPIRAMATE 25 MG PO TABS: ORAL_TABLET | ORAL | 1 refills | Status: DC

## 2021-04-11 MED ORDER — SUMATRIPTAN SUCCINATE 25 MG PO TABS: 25.0000 mg | ORAL_TABLET | ORAL | 0 refills | Status: DC | PRN

## 2021-04-11 NOTE — Patient Instructions (Signed)
Thank you for coming to see me today. It was a pleasure. Today we talked about:   We will get some labs today.  If they are abnormal or we need to do something about them, I will call you.  If they are normal, I will send you a message on MyChart (if it is active) or a letter in the mail.  If you don't hear from [place] in 2 weeks, please call the office at the number below.  I have placed a referral to Neurology for your headaches.  If you do not hear from them in the next 2 weeks, please give [place] a call.  You will be contacted about a Geriatric Clinic appointment.  Take Topamax 25mg  once a day for two weeks.  Then take it twice a day.    Please follow-up with me or new PCP in 1-2 months.  If you have any questions or concerns, please do not hesitate to call the office at [phone].  Best,   [place] Constable, DO

## 2021-04-11 NOTE — Progress Notes (Signed)
SUBJECTIVE:   CHIEF COMPLAINT / HPI:   [name] returns today for hospital follow up from a TIA, and is accompanied by her daughter.   Hospital follow up The pt's daughter notes that the pt has returned to her baseline post discharge and they want to discuss the patient's headaches today. [name] continues her three week regimen of dual anti-platelet therapy (aspirin and plavix) and will continue plavix alone at the end of 3 weeks. Will f/u with Neurology on 05/22/21 post TIA. No focal deficits remaining today. Potassium was low during admission, was then repleted, and will check with labs today. Also checking Lipid panel today.  Headaches [name] has been having daily sharp, stabbing headaches for 1.5 months which can last up to a day long. Her headaches present in the Right temple and sometimes present across her forehead. [name] denies fevers, vision changes, nausea, vomiting, watering eyes or runny nose. The pt has a history of migraines and reports that these headaches feel different. [name] had extensive head imaging while in the hospital for her TIA and a secondary headache etiology was not observed. [name] has tried using Tylenol and Excedrin without relief.  T2DM Continues on Metformin. Will check A1C today.  Cognitive Impairment Stable chronic memory loss for 19 years. Family is actively involved in patient's care. No cause yet elucidated for this patient's initial memory loss. The pt takes Clonazepam daily and has had conversations about this medication not being well suited for daily use. Previously referred this pt to Geriatric clinic for further management and evaluation but pt became sick the day of her appointment. Family requests another referral. Pt was also recommended to receive Neuropsych referral during her most recent admission.   PERTINENT  PMH / PSH:  TIA HTN DM Anxiety Cognitive impairment Seizures  OBJECTIVE:   BP (!) 147/86   Pulse 81   Ht 5\' 3"  (1.6 m)    Wt 159 lb 3.2 oz (72.2 kg)   SpO2 95%   BMI 28.20 kg/m   Physical Exam Constitutional:      Appearance: Normal appearance.  Cardiovascular:     Rate and Rhythm: Normal rate.     Heart sounds: Normal heart sounds.  Pulmonary:     Effort: Pulmonary effort is normal.     Breath sounds: Normal breath sounds.  Skin:    General: Skin is warm and dry.  Neurological:     General: No focal deficit present.     Mental Status: [name] is alert.  Psychiatric:        Mood and Affect: Mood normal.        Behavior: Behavior normal.      ASSESSMENT/PLAN:   Headache Daily headaches without red flag symptoms. Recent hospital imaging in setting of TIA unremarkable. Permissive hypertension in hospital not helpful. Given absence of red flag symptoms and a history which is inconsistent with tension or cluster headaches, suspect that pt may be having migraines Will check Sed Rate to firmly rule out Temporal arteritis Will try low dose Topiramate for migraine prophylaxis. Can also try Imitrex again Referral to Neurology for further management and evaluation  Cognitive impairment Pt with 19 years of insidious onset memory loss. Referring this pt to both Neuropsych and our Geriatric clinic  Diabetes mellitus Well controlled on 500mg  Metformin. A1C at 6.1 today.  Hospital discharge follow-up Potassium was low during admission. Was then repleted. Will check today to ensure sustained normalcy with BMP.     [name]  [name], [place]

## 2021-04-11 NOTE — Assessment & Plan Note (Signed)
Well controlled on 500mg  Metformin. A1C at 6.1 today.

## 2021-04-11 NOTE — Assessment & Plan Note (Signed)
Pt with 19 years of insidious onset memory loss. Referring this pt to both Neuropsych and our Geriatric clinic

## 2021-04-11 NOTE — Assessment & Plan Note (Signed)
Potassium was low during admission. Was then repleted. Will check today to ensure sustained normalcy with BMP.

## 2021-04-11 NOTE — Progress Notes (Signed)
FPTS Upper-Level Resident Addendum   I have independently interviewed and examined the patient. I have discussed the above with the original author and agree with their documentation.   Physical Exam:  General: 70 y.o. female in NAD HEENT: PERRL, EOMI Lungs: Breathing comfortably on room air Skin: warm and dry Extremities: No edema, 5/5 strength BUE/BLE Neuro: CN II-XII grossly intact, sensation intact throughout  A/P:  Headache No red flag symptoms.  She recently had CT and MRI.  She has a history of migraines, but these seem slightly different.  Very low suspicion for temporal arteritis, will go ahead and check a sed rate however to rule out.  Discussed with Dr. Wendy Poet, geriatrician, who recommends trial of Topamax.  We will start at 25 mg daily, then after 2 weeks will increase to 25 mg twice daily.  In the short-term can also take scheduled Tylenol for the next few days and try Imitrex as needed for headache.  She was also referred to neurology for headaches.  Cognitive impairment This is a long standing issue.  At baseline, she has never remembered that I am her PCP despite seeing her numerous times.  I discussed neuropsychiatry referral, but after discussing with Dr. Wendy Poet, will hold off on this until patient is evaluated by the geriatric clinic.  Ultimately, would like to get her off of clonazepam, but this has been difficult in the past.  Will have them discuss at this appointment.  Message was sent to Jazmin to schedule.  Hospital follow-up Recently admitted for TIA, remains at neurologic baseline.  Confirmed that patient is currently on DAPT, reiterated that she should only be on Plavix and aspirin for 3 weeks, then discontinue aspirin and continue Plavix indefinitely.  She has an appointment set up with neurology for the stroke team follow-up in 4 weeks.  We will obtain a BMP today given hypokalemia in hospital and Lipid Panel as this was not obtained in hospital.   F/U 1-2  months.  [place] Constable, D.O. PGY-3, [facility] 04/11/2021 3:36 PM

## 2021-04-11 NOTE — Assessment & Plan Note (Addendum)
Daily headaches without red flag symptoms. Recent hospital imaging in setting of TIA unremarkable. Permissive hypertension in hospital not helpful. Given absence of red flag symptoms and a history which is inconsistent with tension or cluster headaches, suspect that pt may be having migraines Will check Sed Rate to firmly rule out Temporal arteritis Will try low dose Topiramate for migraine prophylaxis. Can also try Imitrex again Referral to Neurology for further management and evaluation

## 2021-04-12 ENCOUNTER — Other Ambulatory Visit: Payer: Self-pay | Admitting: Family Medicine

## 2021-04-12 ENCOUNTER — Encounter: Payer: Self-pay | Admitting: Family Medicine

## 2021-04-12 DIAGNOSIS — E785 Hyperlipidemia, unspecified: Secondary | ICD-10-CM

## 2021-04-12 LAB — BASIC METABOLIC PANEL
BUN/Creatinine Ratio: 9 — ABNORMAL LOW (ref 12–28)
BUN: 7 mg/dL — ABNORMAL LOW (ref 8–27)
CO2: 19 mmol/L — ABNORMAL LOW (ref 20–29)
Calcium: 9.5 mg/dL (ref 8.7–10.3)
Chloride: 104 mmol/L (ref 96–106)
Creatinine, Ser: 0.74 mg/dL (ref 0.57–1.00)
Glucose: 120 mg/dL — ABNORMAL HIGH (ref 65–99)
Potassium: 3.6 mmol/L (ref 3.5–5.2)
Sodium: 143 mmol/L (ref 134–144)
eGFR: 87 mL/min/{1.73_m2} (ref >59)

## 2021-04-12 LAB — LIPID PANEL
Chol/HDL Ratio: 2.6 ratio (ref 0.0–4.4)
Cholesterol, Total: 164 mg/dL (ref 100–199)
HDL: 63 mg/dL (ref >39)
LDL Chol Calc (NIH): 86 mg/dL (ref 0–99)
Triglycerides: 80 mg/dL (ref 0–149)
VLDL Cholesterol Cal: 15 mg/dL (ref 5–40)

## 2021-04-12 LAB — SEDIMENTATION RATE: Sed Rate: 24 mm/hr (ref 0–40)

## 2021-04-12 MED ORDER — ATORVASTATIN CALCIUM 80 MG PO TABS
80.0000 mg | ORAL_TABLET | Freq: Every day | ORAL | 3 refills | Status: DC
Start: 2021-04-12 — End: 2022-06-19

## 2021-04-12 NOTE — Progress Notes (Signed)
Rx sent for lipitor 80mg  given LDL goal 70

## 2021-04-25 ENCOUNTER — Other Ambulatory Visit: Payer: Self-pay | Admitting: Family Medicine

## 2021-04-25 DIAGNOSIS — F411 Generalized anxiety disorder: Secondary | ICD-10-CM

## 2021-04-25 NOTE — Telephone Encounter (Signed)
PMP reviewed, no redl flags

## 2021-05-16 ENCOUNTER — Other Ambulatory Visit: Payer: Self-pay | Admitting: *Deleted

## 2021-05-16 ENCOUNTER — Telehealth: Payer: Self-pay | Admitting: *Deleted

## 2021-05-16 DIAGNOSIS — Z0189 Encounter for other specified special examinations: Secondary | ICD-10-CM

## 2021-05-16 NOTE — Chronic Care Management (AMB) (Signed)
  Care Management   Note  05/16/2021 Name: [name] MRN: [id number] DOB: 1951/09/24  [name] is a 70 y.o. year old female who is a primary care patient of Wright, Bernita Raisin, DO. I reached out to [employer] by phone today in response to a referral sent by [name] PCP, Meccariello, Bernita Raisin, DO.    [name] was given information about care management services today including:  Care management services include personalized support from designated clinical staff supervised by her physician, including individualized plan of care and coordination with other care providers 24/7 contact phone numbers for assistance for urgent and routine care needs. The patient may stop care management services at any time by phone call to the office staff.  Patient spouse Mr. [place] verbally agreed to services and verbal consent obtained.   Follow up plan: Telephone appointment with care management team member scheduled for:05/17/2021  [place] [facility]

## 2021-05-17 ENCOUNTER — Ambulatory Visit: Payer: Medicare Other | Admitting: Licensed Clinical Social Worker

## 2021-05-17 ENCOUNTER — Encounter: Payer: Self-pay | Admitting: Licensed Clinical Social Worker

## 2021-05-17 DIAGNOSIS — G319 Degenerative disease of nervous system, unspecified: Secondary | ICD-10-CM

## 2021-05-17 DIAGNOSIS — Z7189 Other specified counseling: Secondary | ICD-10-CM

## 2021-05-17 DIAGNOSIS — J309 Allergic rhinitis, unspecified: Secondary | ICD-10-CM | POA: Insufficient documentation

## 2021-05-17 DIAGNOSIS — Z0189 Encounter for other specified special examinations: Secondary | ICD-10-CM

## 2021-05-17 HISTORY — DX: Degenerative disease of nervous system, unspecified: G31.9

## 2021-05-17 NOTE — Chronic Care Management (AMB) (Signed)
Care Management Clinical Social Work  [facility] Psych[facility]    05/17/2021 Name: [name] MRN: [id number] DOB: 1951-09-01  [name] is a 70 y.o. year old female who sees Meccariello, Bernita Raisin, DO for primary care.    Engaged with patient by telephone for initial visit in response to a referral from [name] for social work care coordination services to assess needs, support and barriers to care prior to [name] clinic appointment  Consent to Services:  [name] was given information about Care Management services today including:  Care Management services includes personalized support from designated clinical staff supervised by her physician, including individualized plan of care and coordination with other care providers 24/7 contact phone numbers for assistance for urgent and routine care needs. The patient may stop case management services at any time by phone call to the office staff.  Patient agreed to services and consent obtained.    (Look under patient's history for social information from assessment) Assessment:Patient was accompanied by her husband [name] of 27 years who provided some information but mostly supported patient during this encounter. Patient was engaged in [employer] and talkative. She writes everything down so that she does not forget.  States she also writes things down as at time has difficulty remembering what is real and what was a dream.   Reminded patient's husband to send all medications.  Patient's daughter [name] will accompany her to the [facility] appointment 05/18/2021. See Care Plan below for other interventions and patient self-care actives.  Recent life changes /stressors: concerned with headaches in the middle of the night, and difficulty with her memory.  Husband noticed increase in sleep at night ( 9pm to 11AM)  and naps during the day.  Reports this is new in the past several months  Recommendation: Patient may benefit from,  and is in agreement to work on health care [place]..   Follow up Plan: No follow up scheduled with CCM team at this time. Will follow up with patient and husband after [facility] appointment  .   Review of patient past medical history, allergies, medications, and health status, including review of relevant consultants reports was performed today as part of a comprehensive evaluation and provision of chronic care management and care coordination services.  SDOH(Social Determinants of Health) assessments and interventions performed:    Advanced Directives Status: See Care Plan for related entries.  Care Plan  Conditions to be addressed/monitored: Level of care concerns and Memory Deficits  Care Plan : General Social Work (Adult)  Updates made by Isabella Cane, LCSW since 05/17/2021 12:00 AM   Problem: No Advance Directive    Goal: Effective Long-Term Care Planning with Advance Directive   Start Date: 05/17/2021  This Visit's Progress: On track  Priority: High  Current barriers:   Patient does not have an advance directive.  Needs education, support and coordination in order to meet this need. Clinical Goal(s): Over the next 30 days, the patient and family will review information on advance directive, complete advance directive packet and have notarized.  Interventions: Collaboration with PCP regarding development and update of comprehensive plan of care as evidenced by provider attestation and co-signature. Inter-disciplinary care team collaboration (see longitudinal plan of care) Assessed understanding of Advance Directives. A voluntary discussion about advanced care planning including importance of advanced directives, healthcare proxy and living will was discussed with the patient and husband.  Educational information on Advance Directives as well as an advance directive packet provided.  Patient  Goals/Self-Care Activities : Over the next 30 days Review educational information on Advance  Directive  Complete Advance Directive packet,  Have advance directive notarized and provide a copy to provider office Call LCSW if you have questions      Allergies  Allergen Reactions   Chocolate Hives   Coffee Bean Extract [Coffea Arabica] Rash   Peanut-Containing Drug Products Rash    Possible allergy   Outpatient Encounter Medications as of 05/17/2021  Medication Sig   Acetaminophen-Caffeine (EXCEDRIN TENSION HEADACHE PO) Take 2 tablets by mouth daily as needed (headache).   atorvastatin (LIPITOR) 80 MG tablet Take 1 tablet (80 mg total) by mouth daily.   baclofen (LIORESAL) 10 MG tablet Take 1 tablet (10 mg total) by mouth 3 (three) times daily.   citalopram (CELEXA) 20 MG tablet Take 1 tablet by mouth once daily   cloBAZam (ONFI) 10 MG tablet Take 10 mg by mouth at bedtime.    clonazePAM (KLONOPIN) 0.5 MG tablet Take 1 tablet by mouth twice daily as needed for anxiety   clopidogrel (PLAVIX) 75 MG tablet Take 1 tablet (75 mg total) by mouth daily.   hydrOXYzine (VISTARIL) 25 MG capsule TAKE 1 CAPSULE BY MOUTH TWICE DAILY AS NEEDED (Patient taking differently: Take 25 mg by mouth 2 (two) times daily as needed for anxiety.)   levETIRAcetam (KEPPRA) 500 MG tablet Take 3 tablets (1,500 mg total) by mouth 2 (two) times daily.   levocetirizine (XYZAL) 5 MG tablet Take 5 mg by mouth every evening.   levothyroxine (EUTHYROX) 75 MCG tablet Take 1 tablet (75 mcg total) by mouth daily before breakfast.   losartan (COZAAR) 50 MG tablet TAKE 1 TABLET BY MOUTH AT BEDTIME (Patient taking differently: Take 50 mg by mouth daily.)   metFORMIN (GLUCOPHAGE) 500 MG tablet TAKE 1 TABLET BY MOUTH TWICE DAILY WITH A MEAL (Patient taking differently: Take 500 mg by mouth 2 (two) times daily with a meal.)   QUEtiapine (SEROQUEL) 50 MG tablet TAKE 1/2 (ONE-HALF) TABLET BY MOUTH AT BEDTIME (Patient taking differently: Take 25 mg by mouth at bedtime.)   SUMAtriptan (IMITREX) 25 MG tablet Take 1 tablet (25 mg  total) by mouth every 2 (two) hours as needed for migraine. May repeat in 2 hours if headache persists or recurs.   topiramate (TOPAMAX) 25 MG tablet Take 25mg  once daily for two weeks, then 25mg  twice daily.   No facility-administered encounter medications on file as of 05/17/2021.   Patient Active Problem List   Diagnosis Date Noted   Headache 04/11/2021   Hospital discharge follow-up 04/11/2021   CVA (cerebral vascular accident) ([place]) 04/08/2021   TIA (transient ischemic attack) 04/08/2021   Tension headache 03/01/2021   Hypokalemia 09/21/2020   Cough due to ACE inhibitor 06/16/2020   Pompholyx eczema 05/30/2018   Diabetes mellitus ([place]) 04/21/2015   Hx of adenomatous colonic polyps 01/26/2015   Seizure ([place]) 02/23/2010   Disturbance in sleep behavior 01/05/2010   Depression 09/13/2009   Anxiety state 09/03/2007   Hypothyroidism 01/23/2007   Hyperlipidemia 01/23/2007   Cognitive impairment 01/23/2007   HYPERTENSION, BENIGN SYSTEMIC 01/23/2007    Casimer Lanius, [place] / [place]   [phone] 2:52 PM

## 2021-05-17 NOTE — Patient Instructions (Signed)
Licensed Clinical Social Worker Visit Information  Goals we discussed today:   Goals Addressed             This Visit's Progress    Effective Long-Term Care Planning       Patient Goals/Self-Care Activities :  Review educational information on Advance Directive  Complete Advance Directive packet,  Have advance directive notarized and provide a copy to provider office Call LCSW if you have questions        [name] was given information about Care Management services today including:  Care Management services include personalized support from designated clinical staff supervised by her physician, including individualized plan of care and coordination with other care providers 24/7 contact phone numbers for assistance for urgent and routine care needs. The patient may stop Care Management services at any time by phone call to the office staff. Patient and husband verbally agreed to assistance and services provided by embedded care coordination/care management team today.  Patient verbalizes understanding of instructions provided today and agrees to view in [place].   Follow up plan: SW will follow up with patient by phone over the next 14 to 21 days   Casimer Lanius, [place] / [place]   [phone] 2:53 PM

## 2021-05-18 ENCOUNTER — Ambulatory Visit (INDEPENDENT_AMBULATORY_CARE_PROVIDER_SITE_OTHER): Payer: Medicare Other | Admitting: Family Medicine

## 2021-05-18 ENCOUNTER — Other Ambulatory Visit: Payer: Self-pay

## 2021-05-18 VITALS — BP 143/74 | HR 66 | Ht 63.0 in | Wt 159.1 lb

## 2021-05-18 DIAGNOSIS — H9193 Unspecified hearing loss, bilateral: Secondary | ICD-10-CM

## 2021-05-18 DIAGNOSIS — G43909 Migraine, unspecified, not intractable, without status migrainosus: Secondary | ICD-10-CM

## 2021-05-18 DIAGNOSIS — F411 Generalized anxiety disorder: Secondary | ICD-10-CM

## 2021-05-18 DIAGNOSIS — F339 Major depressive disorder, recurrent, unspecified: Secondary | ICD-10-CM

## 2021-05-18 DIAGNOSIS — G9349 Other encephalopathy: Secondary | ICD-10-CM

## 2021-05-18 DIAGNOSIS — H9190 Unspecified hearing loss, unspecified ear: Secondary | ICD-10-CM

## 2021-05-18 DIAGNOSIS — N3941 Urge incontinence: Secondary | ICD-10-CM

## 2021-05-18 DIAGNOSIS — E119 Type 2 diabetes mellitus without complications: Secondary | ICD-10-CM

## 2021-05-18 DIAGNOSIS — H547 Unspecified visual loss: Secondary | ICD-10-CM

## 2021-05-18 DIAGNOSIS — G40209 Localization-related (focal) (partial) symptomatic epilepsy and epileptic syndromes with complex partial seizures, not intractable, without status epilepticus: Secondary | ICD-10-CM

## 2021-05-18 DIAGNOSIS — R2681 Unsteadiness on feet: Secondary | ICD-10-CM

## 2021-05-18 DIAGNOSIS — R4189 Other symptoms and signs involving cognitive functions and awareness: Secondary | ICD-10-CM

## 2021-05-18 DIAGNOSIS — G4489 Other headache syndrome: Secondary | ICD-10-CM

## 2021-05-18 DIAGNOSIS — Z8659 Personal history of other mental and behavioral disorders: Secondary | ICD-10-CM

## 2021-05-18 DIAGNOSIS — I639 Cerebral infarction, unspecified: Secondary | ICD-10-CM

## 2021-05-18 DIAGNOSIS — F331 Major depressive disorder, recurrent, moderate: Secondary | ICD-10-CM

## 2021-05-18 DIAGNOSIS — G44209 Tension-type headache, unspecified, not intractable: Secondary | ICD-10-CM

## 2021-05-18 HISTORY — DX: Urge incontinence: N39.41

## 2021-05-18 HISTORY — DX: Unspecified hearing loss, unspecified ear: H91.90

## 2021-05-18 NOTE — Assessment & Plan Note (Addendum)
Discontinue topiramate as it is temporally related to patient's dizziness and unsteady walking.  Unfortunately, the topiramate appeared to have decreased the frequency and intensity of her headaches.   Once her dizziness and gait impairment are resolved, a lower dose to topiramate may want to be considered.

## 2021-05-18 NOTE — Progress Notes (Signed)
Provider:  Lissa Morales, MD Location:      Place of Service:     PCP: Cleophas Dunker, DO Patient Care Team: Cleophas Dunker, DO as PCP - General (Family Medicine) Johnn Hai, MD (Inactive) as Referring Physician (Psychiatry) Maurine Cane, LCSW as Social Worker (Licensed Clinical Social Worker)  Extended Emergency Contact Information Primary Emergency Contact: Scarlette Slice Address: [place]          [place] [address] Johnnette Litter of [place] Phone: [phone] Mobile Phone: [phone] Relation: Spouse Secondary Emergency Contact: [place] Mobile Phone: [phone] Relation: Daughter  Code Status: DNR Goals of Care: Advanced Directive information Advanced Directives 05/18/2021  Does Patient Have a Medical Advance Directive? No  Type of Advance Directive -  Does patient want to make changes to medical advance directive? -  Copy of [place] in Chart? -  Would patient like information on creating a medical advance directive? No - Patient declined   [facility]:   Patient is accompanied by  daughter [name]) Primary caregiver:  spouse Patient's Currently living arrangement:  spouse Patient information was obtained from ER records, historical medical records, and office notes  patient and daughter. History/Exam limitations:  none Primary Care Provider:   Cleophas Dunker, DO Referring provider:  Cleophas Dunker, DO Reason for referral:  Memory loss, cognitive Impairment, medication review      HPI by problems:  Chief Complaint  Patient presents with   Memory Loss   Struggled with memory since  an acute event in 2003. More recently, Daughter has concerns about short term memory loss- for example, daughter reports when they get home, she might not even remember that she was at the doctor's appointment. Daughter reports that they are seeking a specific diagnosis. Addtionally, daughter would like to decrease psychiatric medications as she reports they are not helping and she is concerned they might be contributing to patient's cognitive issues.   Started on Topomax for severe headaches. Daughter reports improvement in headaches, but is having severe dizziness. They have continued to take this medicaiton. She has seen the neurologist for headaches in the past. Was on imitrex. Excedrin has also helped in the past.    Cognitive impairment concern  Are there problems with thinking?  Cognition domains: Memory difficulties, Social interactions difficulties, Orientation to location difficulty, Getting lost in familiar places, Learning difficulty with new activities or information, Task performance difficulties, Attention difficulties , Task switching difficulties, and Planning difficulties  When were the changes first noticed?  Years ago - 2003  Did this change occur abruptly or gradually?  abrupt  How have the changes progressed since then?  Gradually worsening, but memory problems always present   Has there been any tremors or abnormal movements?  no  Have they had in hallucinations or delusions:  no  Have they appeared more anxious or sad lately?  yes  Do they still have interests or activities they enjor doing?  no  How has their appetite been lately?  show no change  How has their sleep been lately?  Sleeping a lot more- during day and at night.    Compared to 5 to 10 years ago, how is the patient at:  Problems with Judgment, e.g., problem making decisions, bad financial decisions, problems with thinking?  yes  Less interested in hobbies or previously enjoyed  activities?  yes  Problem remembering things about family and friends e.g. names,  occupations, birthdays, addresses?  Yes.Okay with names, but worse with birthdays compared to before.   Problem remembering conversations or news events a few days later?  yes  Problem remembering what day and month it is? yes  Problem with losing things?  yes  Problem learning to use a new gadget or machine around the house, e.g., cell phones, computer, microwave, remote control?  yes  Problem with handling money for shopping?  no  Problem handling financial matters, e.g. their pension, checking, credit cards, dealing with the bank?  no, Spouse takes care of these things   Problem with getting lost in familiar places?  yes    Geriatric Depression Scale:  74 / 15   Outpatient Encounter Medications as of 05/18/2021  Medication Sig   Acetaminophen-Caffeine (EXCEDRIN TENSION HEADACHE PO) Take 2 tablets by mouth daily as needed (headache).   atorvastatin (LIPITOR) 80 MG tablet Take 1 tablet (80 mg total) by mouth daily.   baclofen (LIORESAL) 10 MG tablet Take 1 tablet (10 mg total) by mouth 3 (three) times daily.   citalopram (CELEXA) 20 MG tablet Take 1 tablet by mouth once daily   cloBAZam (ONFI) 10 MG tablet Take 10 mg by mouth at bedtime.    clonazePAM (KLONOPIN) 0.5 MG tablet Take 1 tablet by mouth twice daily as needed for anxiety   clopidogrel (PLAVIX) 75 MG tablet Take 1 tablet (75 mg total) by mouth daily.   hydrOXYzine (VISTARIL) 25 MG capsule TAKE 1 CAPSULE BY MOUTH TWICE DAILY AS NEEDED (Patient taking differently: Take 25 mg by mouth 2 (two) times daily as needed for anxiety.)   levETIRAcetam (KEPPRA) 500 MG tablet Take 3 tablets (1,500 mg total) by mouth 2 (two) times daily.   levocetirizine (XYZAL) 5 MG tablet Take 5 mg by mouth every evening.   levothyroxine (EUTHYROX) 75 MCG tablet Take 1 tablet (75 mcg total) by mouth daily before breakfast.   losartan (COZAAR) 50 MG tablet  TAKE 1 TABLET BY MOUTH AT BEDTIME (Patient taking differently: Take 50 mg by mouth daily.)   metFORMIN (GLUCOPHAGE) 500 MG tablet TAKE 1 TABLET BY MOUTH TWICE DAILY WITH A MEAL (Patient taking differently: Take 500 mg by mouth 2 (two) times daily with a meal.)   QUEtiapine (SEROQUEL) 50 MG tablet TAKE 1/2 (ONE-HALF) TABLET BY MOUTH AT BEDTIME (Patient taking differently: Take 25 mg by mouth at bedtime.)   SUMAtriptan (IMITREX) 25 MG tablet Take 1 tablet (25 mg total) by mouth every 2 (two) hours as needed for migraine. May repeat in 2 hours if headache persists or recurs.   topiramate (TOPAMAX) 25 MG tablet Take 25mg  once daily for two weeks, then 25mg  twice daily.   No facility-administered encounter medications on file as of 05/18/2021.    History Patient Active Problem List   Diagnosis Date Noted   Complex partial seizure evolving to generalized seizure ([place])    Allergic rhinitis 05/17/2021   Cerebral atrophy ([place]) 05/17/2021   Tension headache 03/01/2021   Diabetes mellitus type 2, controlled ([place]) 04/21/2015   Disturbance in sleep behavior 01/05/2010   Depression 09/13/2009   Anxiety state 09/03/2007   Hypothyroidism 01/23/2007   Hyperlipidemia associated with type 2 diabetes mellitus ([place]) 01/23/2007   Cognitive impairment 01/23/2007   Hypertension associated with diabetes ([place]) 01/23/2007   Static encephalopathy 11/26/2001   Past Medical History:  Diagnosis Date   Allergy    Anxiety    Arthritis    Cerebral atrophy ([place]) 05/17/2021   Cough due to  ACE inhibitor 06/16/2020   CVA (cerebral vascular accident) ([place]) 04/08/2021   Diabetes mellitus type 2, controlled ([place]) 04/21/2015   Headache 04/11/2021   Hx of adenomatous colonic polyps 01/26/2015   and also 2021 - recall 2024   Hyperlipidemia    Hypertension    Memory loss of unknown cause 03/04/2002   suspect depression with psychotic features. patient hospitalized and work-up negative.    Panic attack    5-6 a day per  pt and husband    Pompholyx eczema 05/30/2018   Seizure ([place]) 02/23/2010   Qualifier: Diagnosis of  By: Erin Hearing MD, [place]     Seizures [facility])    per husband last seizure 3 yeras ago- documented 06-29-2020   Thyroid disease    thyroid surgery when young   TIA (transient ischemic attack) 04/08/2021   Past Surgical History:  Procedure Laterality Date   BUNIONECTOMY Bilateral    COLONOSCOPY     THYROIDECTOMY, PARTIAL     TONSILLECTOMY     Family History  Problem Relation Age of Onset   Coronary artery disease Sister    Cancer - Prostate Paternal Grandfather    Colon cancer Neg Hx    Rectal cancer Neg Hx    Stomach cancer Neg Hx    Colon polyps Neg Hx    Esophageal cancer Neg Hx    Social History   Socioeconomic History   Marital status: Married    Spouse name: Not on file   Number of children: Not on file   Years of education: Not on file   Highest education level: Not on file  Occupational History   Not on file  Tobacco Use   Smoking status: Never   Smokeless tobacco: Never  Vaping Use   Vaping Use: Never used  Substance and Sexual Activity   Alcohol use: No    Alcohol/week: 0.0 standard drinks   Drug use: No   Sexual activity: Not on file  Other Topics Concern   Not on file  Social History Narrative   Social Information 05/17/2021   Patient lives with husband  ,has 2 daughters . Patient enjoys watching bird, doing puzzles and watching TV .  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  Has 12 siblings and raised on a farm.   Primary family support persons is her husband of 27 years.    Transportation to appointments provided by husband or daughter;   ( she no longer drives, last drove about 2 years ago)   Strengths:Active sense of humor   Programmer, multimedia   Motivation for treatment/growth   Supportive family/friends      Basic Activities of Daily Living    Dressing:Self-care   Eating: Self-care  ( husband does all of the  cooking)   Ambulation: Self-care   Toileting: Self-care   Bathing: Self-care       Instrumental Activities of Daily Living   Shopping: Total assistance   House/Yard Work: Partial assistance   Administration of medications: Partial assistance with reminders   Finances: Total assistance   Telephone: Self-care   Transportation: Total assistance   Social Determinants of Health   Financial Resource Strain: Not on file  Food Insecurity: No Food Insecurity   Worried About Charity fundraiser in the Last Year: Never true   Ran Out of Food in the Last Year: Never true  Transportation Needs: No Transportation Needs   Lack of Transportation (Medical): No   Lack of  Transportation (Non-Medical): No  Physical Activity: Unknown   Days of Exercise per Week: 0 days   Minutes of Exercise per Session: Not on file  Stress: Not on file  Social Connections: Socially Integrated   Frequency of Communication with Friends and Family: Three times a week   Frequency of Social Gatherings with Friends and Family: Once a week   Attends Religious Services: 1 to 4 times per year   Active Member of [employer] or Organizations: Yes   Attends Archivist Meetings: 1 to 4 times per year   Marital Status: Married      Cardiovascular Risk Factors: Hypertension, NIDDM, Overweight, and TIA  Educational History: 12  years formal education Personal History of Seizures: Yes - follows with neurology for static epilepsy Personal History of Stroke: No - TIA  Personal History of Head Trauma: No  Personal History of Psychiatric Disorders: Yes - Anxiety, depression  Family History of Dementia: Not asked   Basic Activities of Daily Living  Dressing: Self-care Eating: Self-care Ambulation: Self-care Toileting: Self-care Bathing: Self-care  Instrumental Activities of Daily Living Shopping: Total assistance House/Yard Work: Education officer, environmental of medications: Partial assistance Finances: Total  assistance Telephone: Self-care Transportation: Total assistance  Caregivers in home: spouse. He is not present today  Caregiver Stress Self-Assessment (Zarit Score):  n/a  out of 80    Formal Home Health Assistance  Physical Therapy: no, Outpatient PT evaluation 04/09/21  Occupational Therapy: no             Home Aid / Personal Care Service: no             Homemaker services: no  FALLS in last five office visits:  Fall Risk  05/18/2021 12/21/2020 09/21/2020 05/02/2020 06/18/2018  Falls in the past year? 1 0 0 0 No  Number falls in past yr: 0 0 - - -  Injury with Fall? 0 0 - - -    Health Maintenance reviewed: Immunization History  Administered Date(s) Administered   Fluad Quad(high Dose 65+) 09/21/2020   Influenza,inj,Quad PF,6+ Mos 10/05/2013, 10/20/2014, 01/25/2016, 09/14/2016, 07/24/2017, 12/18/2018   PFIZER Comirnaty(Gray Top)Covid-19 Tri-Sucrose Vaccine 12/21/2020   PFIZER(Purple Top)SARS-COV-2 Vaccination 01/22/2020, 02/17/2020   Pneumococcal Conjugate-13 07/24/2017   Pneumococcal Polysaccharide-23 10/05/2013, 12/19/2018   Td 08/31/2005   Health Maintenance Topics with due status: Overdue     Topic Date Due   OPHTHALMOLOGY EXAM Never done   MAMMOGRAM Never done   Zoster Vaccines- Shingrix Never done   TETANUS/TDAP 09/01/2015   DEXA SCAN Never done   FOOT EXAM 06/19/2019   COVID-19 Vaccine 04/20/2021    Diet: Regular Nutritional supplements: none  Geriatric Syndromes: Constipation yes  Laxative use:no   Incontinence no  Nocturia: no Dizziness yes   Syncope no  Balance impairment:yes    Skin problems no   Visual Impairment yes   Hearing impairment yes Dentures problems: yes Dry mouth: no  Eating impairment no  Impaired Memory or Cognition yes   Behavioral problems no   Sleep problems yes   Weight loss no Drug Misadventure: no   Joint pain: no Joint stiffness: no Osteoporosis: no DEXA scan in chart Pressure Ulcers: no Immobility: no Ankle edema:  no History of UTIs: no   Vital Signs   There is no height or weight on file to calculate BMI. CrCl cannot be calculated (Patient's most recent lab result is older than the maximum 21 days allowed.). There is no height or weight on file to calculate BSA. There  were no vitals filed for this visit. Wt Readings from Last 3 Encounters:  04/11/21 159 lb 3.2 oz (72.2 kg)  04/08/21 150 lb (68 kg)  03/07/21 145 lb (65.8 kg)   No results found.  Physical Examination:  See Attending note for physical exam    Mini-Mental State Examination or Montreal Cognitive Assessment:  Patient did  require additional cues or prompts to complete tasks. Patient was cooperative and attentive to testing tasks Patient did  appear motivated to perform well  MOCA - 15/30   No flowsheet data found.  MMSE - Mini Mental State Exam 08/22/2017 03/30/2013  Orientation to time 2 4  Orientation to time comments oriented to year and season -  Orientation to Place 5 5  Registration 3 3  Attention/ Calculation 5 4  Attention/Calculation-comments wasn't able to count back but was able to spell world backward -  Recall 0 2  Language- name 2 objects 2 2  Language- repeat 1 1  Language- follow 3 step command 3 3  Language- read & follow direction 1 1  Write a sentence 1 1  Copy design 1 1  Total score 24 27        No flowsheet data found.   Labs No components found for: [facility]  Lab Results  Component Value Date   VITAMINB12 610 07/23/2017    No results found for: FOLATE  Lab Results  Component Value Date   TSH 0.541 04/09/2021    No results found for: RPR  Lab Results  Component Value Date   HIV NON REAC 12/15/2009      Chemistry      Component Value Date/Time   NA 143 04/11/2021 1506   K 3.6 04/11/2021 1506   CL 104 04/11/2021 1506   CO2 19 (L) 04/11/2021 1506   BUN 7 (L) 04/11/2021 1506   CREATININE 0.74 04/11/2021 1506   CREATININE 0.55 01/25/2016 1114      Component Value  Date/Time   CALCIUM 9.5 04/11/2021 1506   ALKPHOS 57 04/08/2021 1525   AST 21 04/08/2021 1525   ALT 19 04/08/2021 1525   BILITOT 0.5 04/08/2021 1525       CrCl cannot be calculated (Patient's most recent lab result is older than the maximum 21 days allowed.).   Lab Results  Component Value Date   HGBA1C 6.1 04/11/2021     @10RELATIVEDAYS @No  results found. Lab Results  Component Value Date   WBC 4.1 04/09/2021   HGB 10.6 (L) 04/09/2021   HCT 33.5 (L) 04/09/2021   MCV 98.5 04/09/2021   PLT 259 04/09/2021    No results found for this or any previous visit (from the past 24 hour(s)).  Imaging Head CT:  04/08/21 CT Head WO 1. Likely acute nonhemorrhagic stroke involving the RIGHT posterior parietal lobe and RIGHT occipital lobe. 2. No mass effect or midline shift currently.  03/07/21 CTA Head & Neck CT Head is negative for intracranial hemorrhage or cortically based infarct. Generalized cerebral volume loss since 2011. Mild for age white matter changes most commonly due to chronic small vessel disease.   Brain MRI:  04/08/21  1. No evidence of acute infarction 2. 1.2 x 0.6 cm dural-based mass along the right aspect of the anterior falx, likely reflecting an incidental meningioma. 3. Mild generalized parenchymal atrophy and cerebral white matter chronic small vessel ischemic disease, progressed as compared to the brain MRI of 12/19/2009.   Personal Strengths Active sense of humor Financial means Motivation for treatment/growth  Supportive family/friends  Support System Strengths Supportive Relationships and Family   Advanced Directives Code Status: Full  Advance Directives: Information provided by Casimer Lanius on 05/17/21. Patient encouraged to complete  Full  code     Assessment and Plan: Please see individual consultation notes from physical therapy, pharmacy and social work for today.    Problem List Items Addressed This Visit       Nervous and Auditory   Static encephalopathy (Chronic)   Complex partial seizure evolving to generalized seizure [facility])   Mrs Sider scored low on her Montreal Cognitive Assessment Test and has impairments in her abilities to perform activities such as organizing her medications, driving, and taking care of her finances.  This could be consistent with Dementia, but given Mrs Rhinehart diagnosis by neurology of static encephalopathy, it would be good to get neurology's opinion of the diagnosis of dementia.    Primary Contact: Extended Emergency Contact Information Primary Emergency Contact: Rochell, [place] Address: [place]          [place] [address] Johnnette Litter of [place] Phone: [phone] Mobile Phone: [phone] Relation: Spouse Secondary Emergency Contact: Valere Dross [place] Mobile Phone: [phone] Relation: Daughter  > 60 minutes face to face were spent in total with interdisciplinary discussion, patient and caretaker counseling and coordination of care took more than 20 minutes. The Geriatric interdisciplinary team meet to discuss the patient's assessment, problem list, and recommendations.  The interdisciplinary team consisted of representatives from medicine, pharmacy,  physical therapy and social work. The interdisciplinary team meet with the patient and caretakers to review the team's findings, assessments, and recommendations.

## 2021-05-18 NOTE — Progress Notes (Signed)
S: Pharmacy consulted to complete medication reconciliation for geriatric clinic. Patient arrives in good spirits accompanied by daughter. Medication reconciliation completed by patient and daughter. Daughter reports step-father organizes medications for patient with a pillbox as patient has memory issues. Reports not taking baclofen, Excedrin, and hydroxyzine. Reports taking a multivitamin and denies NSAID use.  Medication Issues Identified: 1. Patient reported taking aspirin 81 mg and Plavix (clopidogrel) 75 mg daily. Pt was instructed to discontinue aspirin after 3 weeks and continue clopidogrel monotherapy indefinitely. 2. Daughter reports patient's anxiety and depression are uncontrolled despite pharmacotherapy - reports taking 0.5 tablet of Seroquel 50 mg daily 3. Patient/daughter reports new onset dizziness and falls after initiating Topamax (topiramate) 25 mg BID for headaches.  4. Clobazam (Onfi) used for seizure management is listed as active medication in patient chart, however daughter reports patient is not taking this medication. Daughter reports she may have taken it at some point in the past.    Plan: Recommend discontinuing Aspirin 81 mg daily as patient has completed 3 weeks of DAPT Can consider increasing Seroquel (Quetiapine) to 50 mg daily (1 tablet at bed time) Recommend discontinuing Topamax (Topiramate) and discussing alternative options with PCP and Dr. McDiarmid  Recommend determining if patient should be actively taking clobazam 10 mg daily in addition to levetiracetam for seizure management    Patient seen by: Coral Spikes. Anastasia Fiedler, PharmD Candidate; Lorel Monaco, PharmD, BCPS

## 2021-05-18 NOTE — Patient Instructions (Signed)
  [name] scored low on her [facility] Cognitive Assessment Test and has impairments in her abilities to perform activities such as organizing her medications, driving, and taking care of her finances.  This could be consistent with Dementia, but given [name] diagnosis by neurology of static encephalopathy, it would be good to get neurology's opinion of the diagnosis of dementia.    To slow the progress of dementia, it is recommended to have a mediterranean diet, exercise three times a week, and be socially active, especially with group educational activities, like bible study.   [facility] provides geriatric primary medical care to older adult.  Telephone [phone].     Please stop taking aspirin and continue taking the clopidogrel, Plavix.    A referral was made to [facility]

## 2021-05-19 ENCOUNTER — Encounter: Payer: Self-pay | Admitting: Family Medicine

## 2021-05-19 DIAGNOSIS — R2681 Unsteadiness on feet: Secondary | ICD-10-CM | POA: Insufficient documentation

## 2021-05-19 DIAGNOSIS — Z8659 Personal history of other mental and behavioral disorders: Secondary | ICD-10-CM

## 2021-05-19 HISTORY — DX: Personal history of other mental and behavioral disorders: Z86.59

## 2021-05-19 MED ORDER — SUMATRIPTAN SUCCINATE 25 MG PO TABS: 25.0000 mg | ORAL_TABLET | ORAL | 11 refills | Status: AC | PRN

## 2021-05-19 NOTE — Assessment & Plan Note (Signed)
New issue Wears absorbent undergarments Address at follow up [facility] visits

## 2021-05-19 NOTE — Progress Notes (Addendum)
I have interviewed and examined the patient with Dr Vista Lawman.  I agree with their documentation and management in their geriatric medicine consult note.   Mrs Birkhead presents with her daughter, Cherre Huger from [employer], on PCP referral by Dr Sandi Carne for family concerns about the patient's cognition, dizziness and unsteady walking, and multiple number of prescription medications.   Mrs Thier cognitive and memory difficulties first appeared after a febrile illness in 2003 involving a what sounds like decreased level of consciousness for several days.  Hospital admission and work-up did not find an explanation of the patient's persistent impaired memory and orientation upon resuming normal alertness.   Mrs Latvia consulted at [facility] Neurology department by Virgia Land, MD, on 07/09/2014 for the patient's persistent cognitive impairments. She was accompanied by a daughter.   At that time, the patient was still driving, though there were episodes of her becoming lost, and some meal cooking. She was dependent for medication and financial  management by family members.  Family did not think she could be left alone for long periods.  Her MMSE was 22/30 with normal clock draw and interlocking pentagons. Dr Maryruth Hancock thought the best description of Mrs Dokes condition was dementia, including the most common neurodegenerative types.  He felt she had experienced some form of encephalitis and was now experiencing seizures and mood problems due to this distant CNS insult.   Mrs Caillouet has continued to be followed by [facility] for partial symptomatic epilepsy with complex partial seizures, moderate control, dementia associated with other underlying disease without behavioral issues, and static encephalopathy.  Most recent telemedicine visit 02/01/20, Dr Alfonso Patten. Lisabeth Devoid recommended addition of nighttime clobazam 10 mg because of recurrent nocturnal T-C seizures.   She was to continue her Keppra 1500 mg twice a day.   Mrs Briceno was hospitalized overnight at [facility] 5-13-5/14/22 for a TIA event with dysarthria and left-sided weakness that resolved.  She was to take DAPT for 3 weeks then stop ASA and continue Plavix. She continues to be on DAPT.

## 2021-05-19 NOTE — Assessment & Plan Note (Signed)
New problem Need to eliminate possible temporal contribution of topiramate to my observed unsteady gait when walking in halls.   Right foot toe tripped on heel of left foot. She stays near the wall when walking because of fear of falling.   May need outpt Physical Therapy after possibility of topiramate side effect eliminated.

## 2021-05-19 NOTE — Assessment & Plan Note (Signed)
Established problem High score on Geriatric Depression Scale c/w uncontrolled symptoms I am uncertain how much patient's static encephalopathy contributes to [name]'s depressed mood.   [name] requested referral to psychiatry for evaluation and treatment.  [name] would like [name] also to have a therapist.   A referral was made to [facility] [place]. Isabella Casimer Lanius, LCSW, was asked to facilitate the referral process givne patient's cognitive impairments.

## 2021-05-19 NOTE — Assessment & Plan Note (Signed)
Referral for ophthalmology consult Visual impairment right eye on screen  Vision Screening   Right eye Left eye Both eyes  Without correction     With correction 20/70 20/40 20/40

## 2021-05-19 NOTE — Assessment & Plan Note (Signed)
Evidence of progression of cognitive and functional impairments from 07/09/2014 [facility] MMSE of 22/30 to today's MoCA of 14/30 with previously normal but now abnormal clock draw and figure draw.  Her independence in iADLs of driving and cooking in 2015 evaluation now are dependent needs, along with her previously dependence with medication and financial management.  She remains independent in all ADLs.    While [name] one-time cognitive assessment cognitive impairment, including memory impairment, could be due to her static encephalopathy, there is both evidence of progressive decline in cognitive testing scores and in iADL abilities, and [name] neuroscience opinion from 83 that [name] condition could best be described by dementia of neurodegenerative types.   [name] daughter, [name], was somewhat resistant to a neurodegenerative dementia as a partial explanation, in addition to her mother's static encephalopathy, for her mother's cognitive and functional impairments.  We discussed that since her mother would be seeing [facility] in 4 days for hospital follow-up of her mother's TIA, that she could ask them to evaluate the cause of her mother's cognitive and functional impairments.    I encouraged mediterranean diet, exercise, and social activities as interventions to slow the progression of dementia, if it is present.  NIH booklet for family members of patient's with Alzhemier's dementia was given to patient's daughter.   - Counseled patient and family regarding the diagnosis of dementia and progressive nature of the neurodegenerative disorder.    59 minutes face to face where spent in total with counseling / coordination of care took more than 50% of the total time. Counseling involved discussion of the multiple cognitive tests and function test results with patient and wife. Discussion of dementia's nature given.  Patient care with coordinated with our pharmacy  team who also evaluated [name] face-to-face today.

## 2021-05-19 NOTE — Assessment & Plan Note (Signed)
Established problem Likely contributes to patient's cognitive impairment

## 2021-05-19 NOTE — Addendum Note (Signed)
Addended byWendy Poet, Cyncere Ruhe D on: 05/19/2021 11:11 AM   Modules accepted: Orders

## 2021-05-19 NOTE — Assessment & Plan Note (Signed)
New concern Hearing Screening   250Hz  500Hz  1000Hz  2000Hz  4000Hz   Right ear Pass Fail Fail Fail Fail  Left ear Pass Fail Fail Fail Fail   Recommend a referral in near future to [facility] for evaluation and recommendations.

## 2021-05-22 ENCOUNTER — Ambulatory Visit (INDEPENDENT_AMBULATORY_CARE_PROVIDER_SITE_OTHER): Payer: Medicare Other | Admitting: Adult Health

## 2021-05-22 ENCOUNTER — Other Ambulatory Visit: Payer: Self-pay

## 2021-05-22 ENCOUNTER — Encounter: Payer: Self-pay | Admitting: Adult Health

## 2021-05-22 VITALS — BP 151/87 | HR 62 | Ht 62.0 in | Wt 156.0 lb

## 2021-05-22 DIAGNOSIS — I10 Essential (primary) hypertension: Secondary | ICD-10-CM

## 2021-05-22 DIAGNOSIS — E785 Hyperlipidemia, unspecified: Secondary | ICD-10-CM

## 2021-05-22 DIAGNOSIS — E119 Type 2 diabetes mellitus without complications: Secondary | ICD-10-CM | POA: Diagnosis not present

## 2021-05-22 DIAGNOSIS — G459 Transient cerebral ischemic attack, unspecified: Secondary | ICD-10-CM | POA: Diagnosis not present

## 2021-05-22 NOTE — Progress Notes (Signed)
[facility] [address] [place]. [place] [address] [phone]       [place] Date of Birth:  02-26-1951 Medical Record Number:  [id number]   Reason for Referral:  hospital stroke follow up    SUBJECTIVE:   CHIEF COMPLAINT:  Chief Complaint  Patient presents with   Follow-up    MR 11 with son glaston Pt is well, just having balance impairment which has caused some falls.     HPI:   Ms. [name] is a 70 y.o. female with history of  DM2, HTN, HLD, thyroid disease, ?seizures on keppra 500mg  BID who presented on 04/08/2021 with acute onset L facial droop and L arm weakness and resolution of symptoms by the time EMS arrived with a NIHSS of 0.  Personally reviewed hospitalization pertinent progress notes, lab work and imaging with summary provided.  Evaluated by Dr. Erlinda Hong with etiology of symptoms likely right brain TIA. MR brain showed no acute stroke. MRA and carotid Dopplers unremarkable.  2D echo showed EF 60 to 65%.  Recommended DAPT for 3 weeks and aspirin.  BP elevated upon arrival gradually stabilized and resumed home BP medications.  LDL 83.  A1c 7.0 on metformin.  Other stroke risk factors include advanced age, migraines, and seizures on Keppra and Onfi PTA.   Today, 05/22/2021, [name] is being seen for hospital follow-up accompanied by her son.  She has been stable from stroke standpoint without new stroke/TIA symptoms.  She does complain of dizziness with imbalance but concern of possible side effects on topiramate which was started on 5/17 for headaches which was also around the time of her dizziness started.  This has recently been discontinued by PCP with gradual improvement of dizziness.  Baseline mild cognitive impairment able to maintain ADLs but does require assistance for some IADLs by her husband with long history of mild cognitive impairment.  Completed 3 weeks DAPT and remains on Plavix alone without  associated side effects.  Remains on atorvastatin 80 mg daily without myalgias.  Blood pressure today 157/87 -occasionally monitors at home and typically stable per son.  Remains on Keppra for seizure prophylaxis managed by [facility] - has scheduled f/u visit 9/19. Per son, was seen 6/23 at geriatric medicine for memory loss concerns which has been present since 2003 - son was told that todays visit would be discussing ongoing memory concerns and provide a diagnosis and opinion regarding diagnosis of dementia as prior diagnosis of static cephalopathy made by [facility] neurology.  No further concerns at this time.     ROS:   14 system review of systems performed and negative with exception of those listed in HPI  PMH:  Past Medical History:  Diagnosis Date   Allergy    Anxiety    Arthritis    Cerebral atrophy ([place]) 05/17/2021   Cough due to ACE inhibitor 06/16/2020   CVA (cerebral vascular accident) ([place]) 04/08/2021   Diabetes mellitus type 2, controlled ([place]) 04/21/2015   Headache 04/11/2021   Hearing loss 05/18/2021   History of panic attacks 05/19/2021   Hx of adenomatous colonic polyps 01/26/2015   and also 2021 - recall 2024   Hyperlipidemia    Hypertension    Memory loss of unknown cause 03/04/2002   suspect depression with psychotic features. patient hospitalized and work-up negative.    Panic attack    5-6 a day per pt and husband    Pompholyx eczema 05/30/2018  Seizure ([place]) 02/23/2010   Qualifier: Diagnosis of  By: Erin Hearing MD, Ruthann Cancer     Seizures [facility])    per husband last seizure 3 yeras ago- documented 06-29-2020   Thyroid disease    thyroid surgery when young   TIA (transient ischemic attack) 04/08/2021   Urge incontinence 05/18/2021    PSH:  Past Surgical History:  Procedure Laterality Date   BUNIONECTOMY Bilateral    COLONOSCOPY     THYROIDECTOMY, PARTIAL     TONSILLECTOMY      Social History:  Social History   Socioeconomic History   Marital status:  Married    Spouse name: Not on file   Number of children: Not on file   Years of education: Not on file   Highest education level: Not on file  Occupational History   Not on file  Tobacco Use   Smoking status: Never   Smokeless tobacco: Never  Vaping Use   Vaping Use: Never used  Substance and Sexual Activity   Alcohol use: No    Alcohol/week: 0.0 standard drinks   Drug use: No   Sexual activity: Not on file  Other Topics Concern   Not on file  Social History Narrative   Social Information 05/17/2021   Patient lives with husband  ,has 2 daughters . Patient enjoys watching bird, doing puzzles and watching TV .  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  Has 12 siblings and raised on a farm.   Primary family support persons is her husband of 27 years.    Transportation to appointments provided by husband or daughter;   ( she no longer drives, last drove about 2 years ago)   Strengths:Active sense of humor   Programmer, multimedia   Motivation for treatment/growth   Supportive family/friends      Basic Activities of Daily Living    Dressing:Self-care   Eating: Self-care  ( husband does all of the cooking)   Ambulation: Self-care   Toileting: Self-care   Bathing: Self-care       Instrumental Activities of Daily Living   Shopping: Total assistance   House/Yard Work: Partial assistance   Administration of medications: Partial assistance with reminders   Finances: Total assistance   Telephone: Self-care   Transportation: Total assistance   Social Determinants of Health   Financial Resource Strain: Not on file  Food Insecurity: No Food Insecurity   Worried About Charity fundraiser in the Last Year: Never true   Ran Out of Food in the Last Year: Never true  Transportation Needs: No Transportation Needs   Lack of Transportation (Medical): No   Lack of Transportation (Non-Medical): No  Physical Activity: Unknown   Days of Exercise per  Week: 0 days   Minutes of Exercise per Session: Not on file  Stress: Not on file  Social Connections: Socially Integrated   Frequency of Communication with Friends and Family: Three times a week   Frequency of Social Gatherings with Friends and Family: Once a week   Attends Religious Services: 1 to 4 times per year   Active Member of [employer] or Organizations: Yes   Attends Archivist Meetings: 1 to 4 times per year   Marital Status: Married  Human resources officer Violence: Not on file    Family History:  Family History  Problem Relation Age of Onset   Coronary artery disease Sister    Cancer - Prostate Paternal Grandfather    Colon  cancer Neg Hx    Rectal cancer Neg Hx    Stomach cancer Neg Hx    Colon polyps Neg Hx    Esophageal cancer Neg Hx     Medications:   Current Outpatient Medications on File Prior to Visit  Medication Sig Dispense Refill   atorvastatin (LIPITOR) 80 MG tablet Take 1 tablet (80 mg total) by mouth daily. 90 tablet 3   citalopram (CELEXA) 20 MG tablet Take 1 tablet by mouth once daily 90 tablet 0   clonazePAM (KLONOPIN) 0.5 MG tablet Take 1 tablet by mouth twice daily as needed for anxiety (Patient taking differently: 0.25 mg 2 (two) times daily.) 60 tablet 0   clopidogrel (PLAVIX) 75 MG tablet Take 1 tablet (75 mg total) by mouth daily. 90 tablet 1   levETIRAcetam (KEPPRA) 500 MG tablet Take 3 tablets (1,500 mg total) by mouth 2 (two) times daily. 180 tablet 11   levothyroxine (EUTHYROX) 75 MCG tablet Take 1 tablet (75 mcg total) by mouth daily before breakfast. 90 tablet 1   losartan (COZAAR) 50 MG tablet TAKE 1 TABLET BY MOUTH AT BEDTIME (Patient taking differently: Take 50 mg by mouth daily.) 90 tablet 0   metFORMIN (GLUCOPHAGE) 500 MG tablet TAKE 1 TABLET BY MOUTH TWICE DAILY WITH A MEAL (Patient taking differently: Take 500 mg by mouth 2 (two) times daily with a meal.) 180 tablet 0   QUEtiapine (SEROQUEL) 50 MG tablet TAKE 1/2 (ONE-HALF) TABLET BY  MOUTH AT BEDTIME (Patient taking differently: Take 25 mg by mouth at bedtime.) 90 tablet 0   SUMAtriptan (IMITREX) 25 MG tablet Take 1 tablet (25 mg total) by mouth every 2 (two) hours as needed for migraine. May repeat in 2 hours if headache persists or recurs. 9 tablet 11   No current facility-administered medications on file prior to visit.    Allergies:   Allergies  Allergen Reactions   Chocolate Hives   Coffee Bean Extract [Coffea Arabica] Rash   Peanut-Containing Drug Products Rash    Possible allergy   Topiramate Other (See Comments)    Dizziness at 25 mg daily dose      OBJECTIVE:  Physical Exam  Vitals:   05/22/21 1412  BP: (!) 151/87  Pulse: 62  Weight: 156 lb (70.8 kg)  Height: 5\' 2"  (1.575 m)   Body mass index is 28.53 kg/m. No results found.  General: well developed, well nourished, pleasant elderly African-American female, seated, in no evident distress Head: head normocephalic and atraumatic.   Neck: supple with no carotid or supraclavicular bruits Cardiovascular: regular rate and rhythm, no murmurs Musculoskeletal: no deformity Skin:  no rash/petichiae Vascular:  Normal pulses all extremities   Neurologic Exam Mental Status: Awake and fully alert.  Fluent speech and language.  Oriented to place and time. Recent memory impaired and remote memory intact. Attention span, concentration and fund of knowledge appropriate mostly appropriate during visit. Mood and affect appropriate.  Cranial Nerves: Pupils equal, briskly reactive to light. Extraocular movements full without nystagmus. Visual fields full to confrontation. Hearing intact. Facial sensation intact. Face, tongue, palate moves normally and symmetrically.  Motor: Normal bulk and tone. Normal strength in all tested extremity muscles Sensory.: intact to touch , pinprick , position and vibratory sensation.  Coordination: Rapid alternating movements normal in all extremities. Finger-to-nose and  heel-to-shin performed accurately bilaterally. Gait and Station: Arises from chair without difficulty. Stance is normal. Gait demonstrates normal stride length and balance Reflexes: 1+ and symmetric. Toes downgoing.  NIHSS  0 Modified Rankin  0      ASSESSMENT: [name] is a 70 y.o. year old female with recent right brain TIA on 04/08/2021 after presenting with acute onset left facial droop and left arm weakness lasting approximately 30 minutes.  Vascular risk factors include HTN, HLD, DM, history of seizures and longstanding history of cognitive impairment followed by [facility] neurology.      PLAN:  TIA: Continue clopidogrel 75 mg daily  and atorvastatin for secondary stroke prevention.  Discussed secondary stroke prevention measures and importance of close PCP follow up for aggressive stroke risk factor management  HTN: BP goal <130/90.  Stable on current regimen per PCP HLD: LDL goal <70. Recent LDL 83 -continue atorvastatin 80 mg daily.  Request follow-up with PCP for ongoing refills and monitoring and management of lipid panel DMII: A1c goal<7.0. Recent A1c 7.0 on metformin per PCP.  Longstanding cognitive impairment: Initial onset 2003.  Followed by [facility] neurology.  Advised son that they will need to further discuss cognitive concerns with established neurologist Dr. Lisabeth Devoid scheduled on 08/14/2021 as she has been followed by them since 2015.  Apologized to son for misunderstanding of today's visit Seizures: Followed by [facility] neurology   No further follow-up visits required as she is an established patient of [facility] and routinely followed by PCP   CC:  [facility] provider: Dr. Leonie Man PCP: Cleophas Dunker, DO    I spent 56 minutes of face-to-face and non-face-to-face time with patient and son.  This included previsit chart review including review of recent hospitalization, lab review, study review, electronic health record documentation, and patient and son education  and discussion regarding recent TIA including secondary stroke prevention measures and aggressive stroke risk factor management, longstanding cognitive impairment, seizures and answered all other questions to patient and sons satisfaction  Frann Rider, AGNP-BC  [facility] [address] [place], [place] [address]  Phone [phone] Fax [phone] Note: This document was prepared with digital dictation and possible smart phrase technology. Any transcriptional errors that result from this process are unintentional.

## 2021-05-22 NOTE — Patient Instructions (Addendum)
Follow up with [facility] for seizures and memory concerns   Continue clopidogrel 75 mg daily  and atorvastatin 80mg  daily  for secondary stroke prevention Please ensure that you have stopped aspirin at this time as no longer needed  Continue to follow up with PCP regarding cholesterol, blood pressure and diabetes management  Maintain strict control of hypertension with blood pressure goal below 130/90, diabetes with hemoglobin A1c goal below 7% and cholesterol with LDL cholesterol (bad cholesterol) goal below 70 mg/dL.        Thank you for coming to see [place] at [facility]. I hope we have been able to provide you high quality care today.  You may receive a patient satisfaction survey over the next few weeks. We would appreciate your feedback and comments so that we may continue to improve ourselves and the health of our patients.    Transient Ischemic Attack A transient ischemic attack (TIA) causes the same symptoms as a stroke, but the symptoms go away quickly. A TIA happens when blood flow to the brain is blocked. Having a TIA means you may be at risk for a stroke. A TIA is a Engineer, materials. What are the causes? A TIA is caused by a blocked artery in the head or neck. This means the brain does not get the blood supply it needs. A blockage can be caused by: Fatty buildup in an artery in the head or neck. A blood clot. A tear in an artery. Irritation and swelling (inflammation) of an artery. Sometimes the cause is not known. What increases the risk? Certain things may make you more likely to have a TIA. Some of these are things that you can change, such as: Being very overweight. Using products that have nicotine or tobacco. Taking birth control pills. Not being active. Drinking too much alcohol. Using drugs. Health conditions that may increase your risk include: High blood pressure. High cholesterol. Diabetes. Heart disease. A heartbeat that is not  regular (atrial fibrillation). Sickle cell disease. Sleep problems (sleep apnea). Long-term diseases that cause irritation and swelling. Problems with blood clotting. Other risk factors include: Being over the age of 25. Being female. Having a family history of stroke. Having had blood clots, stroke, TIA, or heart attack in the past. Having a history of high blood pressure when pregnant (preeclampsia). Very bad headaches (migraines). What are the signs or symptoms? The symptoms of a TIA are like those of a stroke. They can include: Weakness or loss of feeling in your face, arm, or leg. This often happens on one side of your body. Trouble walking. Trouble moving your arms or legs. Trouble talking or understanding what people are saying. Problems with how you see. Feeling dizzy. Feeling confused. Loss of balance or coordination. Feeling like you may vomit (nausea) or you vomit. Having a very bad headache. If you can, note what time you started to have symptoms. Tell your doctor. How is this treated? The goal of treatment is to lower the risk for a stroke. This may include: Changes to diet and lifestyle, such as getting regular exercise and stopping smoking. Taking medicines to: Thin the blood. Lower blood pressure. Lower cholesterol. Treating other health conditions, such as diabetes. If testing shows that an artery in your brain is narrow, your doctor may recommend a procedure to: Take the blockage out of your artery (carotid endarterectomy). Open or widen an artery in your neck (carotid angioplasty and stenting). Follow these instructions at home: Medicines Take over-the-counter and  prescription medicines only as told by your doctor. If you were told to take aspirin or another medicine to thin your blood, take it exactly as told by your doctor. Taking too much of the medicine can cause bleeding. Taking too little of the medicine may not work to treat the problem. Eating and  drinking  Eat 5 or more servings of fruits and vegetables each day. Follow instructions from your doctor about your diet. You may need to follow a certain diet to help lower your risk of a stroke. You may need to: Eat a diet that is low in fat and salt. Eat foods with a lot of fiber. Limit carbohydrates and sugar. If you drink alcohol: Limit how much you have to: 0-1 drink a day for women who are not pregnant. 0-2 drinks a day for men. Know how much alcohol is in a drink. In the [place] one drink equals one 12 oz bottle of beer (362mL), one 5 oz glass of wine (13mL), or one 1 oz glass of hard liquor (31mL).  General instructions Keep a healthy weight. Try to get at least 30 minutes of exercise on most days. Get treatment if you have sleep problems. Do not smoke or use any products that contain nicotine or tobacco. If you need help quitting, ask your doctor. Do not use drugs. Keep all follow-up visits. Where to find more information American Stroke Association: www.stroke.org Get help right away if: You have chest pain. You have a heartbeat that is not regular. You have any signs of a stroke. "BE FAST" is an easy way to remember the main warning signs: B - Balance. Dizziness, sudden trouble walking, or loss of balance. E - Eyes. Trouble seeing or a change in how you see. F - Face. Sudden weakness or loss of feeling of the face. The face or eyelid may droop on one side. A - Arms. Weakness or loss of feeling in an arm. This happens all of a sudden and most often on one side of the body. S - Speech. Sudden trouble speaking, slurred speech, or trouble understanding what people say. T - Time. Time to call emergency services. Write down what time symptoms started. You have other signs of a stroke, such as: A sudden, very bad headache with no known cause. Feeling like you may vomit. Vomiting. A seizure. These symptoms may be an emergency. Get help right away. Call your local emergency  services (911 in the [place] Do not wait to see if the symptoms will go away. Do not drive yourself to the hospital. Summary A transient ischemic attack (TIA) happens when an artery in the head or neck is blocked. This causes the same symptoms as a stroke. The symptoms go away quickly. A TIA is a medical emergency. Get help right away, even if your symptoms go away. Having a TIA means that you may be at risk for a stroke. Taking medicines and making diet and lifestyle changes can help to prevent a stroke. This information is not intended to replace advice given to you by your health care provider. Make sure you discuss any questions you have with your healthcare provider. Document Revised: 06/07/2020 Document Reviewed: 06/07/2020 Elsevier Patient Education  [place].

## 2021-05-23 ENCOUNTER — Ambulatory Visit: Payer: Medicare Other | Admitting: Family Medicine

## 2021-05-24 ENCOUNTER — Ambulatory Visit: Payer: Medicare Other | Admitting: Family Medicine

## 2021-05-24 NOTE — Progress Notes (Deleted)
    SUBJECTIVE:   CHIEF COMPLAINT / HPI:   Cognitive impairment Patient seen by geriatric clinic on 6/23 [facility] 15 out of 30 Neurology has diagnosed with static encephalopathy, could be consistent with dementia, but per geriatric note, would like neurology's opinion She continues to follow with [facility] for partial symptomatic epilepsy with complex partial seizures Patient also had a TIA and admitted to the hospital from 5/13-5/14 and has been following with [facility] neurology Last seen by [facility] neurology on 6/27 for stroke follow-up Was advised to continue Plavix and atorvastatin, could discontinue aspirin She has an appointment on 9/19 with her neurologist Dr. Lisabeth Devoid and will follow-up about their cognitive concerns then Social work has been asked to help with psychiatry referral*** ***  Headaches She was tried on topiramate, but having possible temporal contribution with unsteady gait, this was discontinued on 6/23*** Topiramate did significantly help her headaches ***  T2DM Current regimen: Metformin 500 mg twice daily Last A1c 6.1 on 5/17 Does not check her CBGs***  HLD On Lipitor 80 mg, increased on 5/17 after LDL resulted at 86 ***  PERTINENT  PMH / PSH: ***  OBJECTIVE:   There were no vitals taken for this visit.  ***  ASSESSMENT/PLAN:   No problem-specific Assessment & Plan notes found for this encounter.     Cleophas Dunker, DO [place]   {    This will disappear when note is signed, click to select method of visit    :1}

## 2021-05-26 NOTE — Progress Notes (Signed)
I agree with the above plan 

## 2021-05-30 ENCOUNTER — Ambulatory Visit: Payer: Self-pay | Admitting: Licensed Clinical Social Worker

## 2021-05-30 NOTE — Chronic Care Management (AMB) (Signed)
  Care Management  Collaboration  Note  05/30/2021 Name: [name] MRN: [id number] DOB: Jun 24, 1951  [name] is a 70 y.o. year old female who is a primary care patient of Alen Bleacher, MD. The CCM team was consulted reference care coordination needs for Mental Health Counseling and Resources.  Assessment: Patient was not interviewed or contacted during this encounter. CCM LCSE collaborated with referral coordinator and f/u on referral with [facility] . See Care Plan below for interventions and patient self-care actives.   Intervention:Conducted brief assessment, recommendations and relevant information discussed.  Outreach call to [facility] referral team left message to call [name].  Called patient's daughter [name], left voice message to call [name].  Follow up Plan: No follow up scheduled with CCM team at this time. Will follow up with patient's daughter in 7 to 10 days if no return call is received.    Review of patient past medical history, allergies, medications, and health status, including review of pertinent consultant reports was performed as part of comprehensive evaluation and provision of care management/care coordination services.   Care Plan Conditions to be addressed/monitored per PCP order: Depression and Dementia,   Patient Care Plan: General Social Work (Adult)   Problem Identified: No Advance Directive    Goal: Effective Long-Term Care Planning with Advance Directive   Start Date: 05/17/2021  This Visit's Progress: On track  Priority: High  Current barriers:   Patient does not have an advance directive.  Needs education, support and coordination in order to meet this need. Clinical Goal(s): Over the next 30 days, the patient and family will review information on advance directive, complete advance directive packet and have notarized.  Interventions: Collaboration with PCP regarding development and update of  comprehensive plan of care as evidenced by provider attestation and co-signature. Inter-disciplinary care team collaboration (see longitudinal plan of care) Assessed understanding of Advance Directives. A voluntary discussion about advanced care planning including importance of advanced directives, healthcare proxy and living will was discussed with the patient and husband.  Educational information on Advance Directives as well as an advance directive packet provided.  Patient Goals/Self-Care Activities : Over the next 30 days Review educational information on Advance Directive  Complete Advance Directive packet,  Have advance directive notarized and provide a copy to provider office Call [name] if you have questions       [name], [place] / [place]   [phone] 10:54 AM

## 2021-05-31 ENCOUNTER — Ambulatory Visit: Payer: Self-pay | Admitting: Licensed Clinical Social Worker

## 2021-05-31 DIAGNOSIS — Z789 Other specified health status: Secondary | ICD-10-CM

## 2021-05-31 NOTE — Chronic Care Management (AMB) (Signed)
  Care Management  Collaboration  Note  05/31/2021 Name: CRISLYN WILLBANKS MRN: [id number] DOB: [birth year]  HARTLEIGH EDMONSTON is a 70 y.o. year old female who is a primary care patient of Alen Bleacher, MD. The CCM team was consulted reference care coordination needs for  assessment of needs for [facility] .  Assessment: Patient was not interviewed or contacted during this encounter. CCM LCSW collaborated with [facility] Coordinator and  [facility].   See Care Plan below for interventions and patient self-care actives.   Interventions: Intervention:Conducted brief assessment, recommendations and relevant information discussed.  Follow up call to [facility], per Ailene Ravel patient has initial appointment July 1st with Nurse practitioner, has two other follow up appointments scheduled with NP and therapist.  July 18th and July 29th.    Follow up Plan: No follow up scheduled with CCM team at this time. , Patient may benefit from CCM LCSW to remain part of care team for the next 30 days.  If no needs are identified in the next 30 days, CCM LCSE will disconnect from the care team.   Review of patient past medical history, allergies, medications, and health status, including review of pertinent consultant reports was performed as part of comprehensive evaluation and provision of care management/care coordination services.   Care Plan Conditions to be addressed/monitored per PCP order: Cognitive Deficits  Patient Care Plan: General Social Work (Adult)   Problem Identified: No Advance Directive    Goal: Effective Long-Term Care Planning with Advance Directive   Start Date: 05/17/2021  This Visit's Progress: On track  Recent Progress: On track  Priority: High  Current barriers:   Patient and family given a copy of advance directive during [facility] [facility] Patient does not have an advance directive.  Needs education, support and coordination in order to meet  this need. Clinical Goal(s): Over the next 30 days, the patient and family will review information on advance directive, complete advance directive packet and have notarized.  Interventions: Inter-disciplinary care team collaboration (see longitudinal plan of care) Assessed understanding of Advance Directives. A voluntary discussion about advanced care planning including importance of advanced directives, healthcare proxy and living will was discussed with the patient and husband.  Educational information on Advance Directives as well as an advance directive packet provided.  Patient Goals/Self-Care Activities : Over the next 30 days Review educational information on Advance Directive  Complete Advance Directive packet,  Have advance directive notarized and provide a copy to provider office Call LCSW if you have questions      Casimer Lanius, [place] / [place]   [phone] 2:31 PM

## 2021-05-31 NOTE — Patient Instructions (Signed)
Visit Information   Goals Addressed             This Visit's Progress    Effective Long-Term Care Planning   On track    Patient Goals/Self-Care Activities :  Review educational information on Advance Directive  Complete Advance Directive packet,  Have advance directive notarized and provide a copy to provider office Call LCSW if you have questions        No further follow up required: LCSW will disconnect from care team in 30 days  Casimer Lanius, [place]

## 2021-06-12 ENCOUNTER — Other Ambulatory Visit: Payer: Self-pay | Admitting: Family Medicine

## 2021-06-12 DIAGNOSIS — I10 Essential (primary) hypertension: Secondary | ICD-10-CM

## 2021-06-12 DIAGNOSIS — E119 Type 2 diabetes mellitus without complications: Secondary | ICD-10-CM

## 2021-06-28 ENCOUNTER — Other Ambulatory Visit: Payer: Self-pay | Admitting: Family Medicine

## 2021-06-28 DIAGNOSIS — F411 Generalized anxiety disorder: Secondary | ICD-10-CM

## 2021-07-17 ENCOUNTER — Other Ambulatory Visit: Payer: Self-pay | Admitting: Student

## 2021-07-17 DIAGNOSIS — E119 Type 2 diabetes mellitus without complications: Secondary | ICD-10-CM

## 2021-07-17 DIAGNOSIS — I10 Essential (primary) hypertension: Secondary | ICD-10-CM

## 2021-07-30 IMAGING — MR MR HEAD W/O CM
6 of 11 series · 24 of 48 positions shown · non-contrast
Comparison: No pertinent prior exam.
COMPARISON: Noncontrast head CT performed earlier today 04/08/2021.
CT angiogram head/neck 03/07/2021. Brain MRI 12/19/2009.

CLINICAL DATA: Neuro deficit, acute, stroke suspected.

EXAM:
MRI HEAD WITHOUT CONTRAST
MRA HEAD WITHOUT CONTRAST
TECHNIQUE: Multiplanar, multi-echo pulse sequences of the brain and surrounding
structures were acquired without intravenous contrast. Angiographic
images of the Circle of Willis were acquired using MRA technique
without intravenous contrast.

[Series 2: DWI · axial · 3.0mm · 0.94mm/px · z∈[-102,+51]mm · 7 of 106 slices shown (1 of 2)]
[im 1/106]
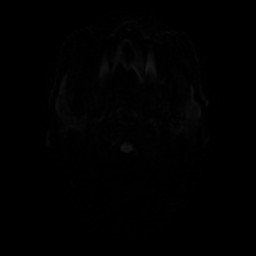
[im 18/106]
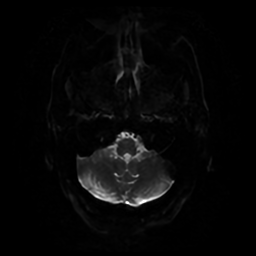
[im 36/106]
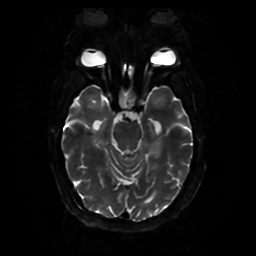
[im 53/106]
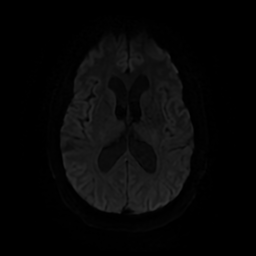
[im 71/106]
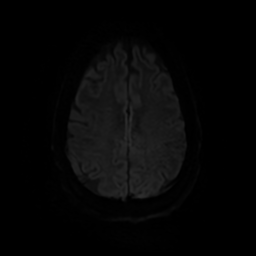
[im 88/106]
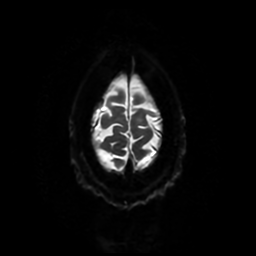
[im 106/106]
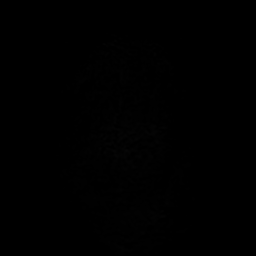

[Series 3: DWI · coronal · 4.0mm · 0.94mm/px · 6 of 76 slices shown (2 of 2)]
[im 1/76]
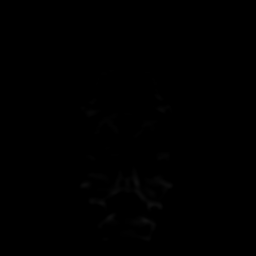
[im 16/76]
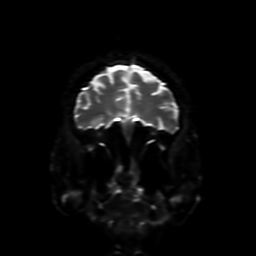
[im 31/76]
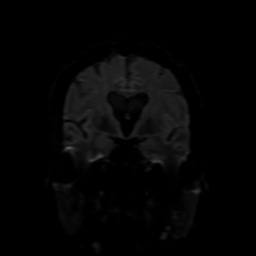
[im 46/76]
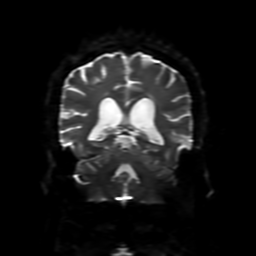
[im 61/76]
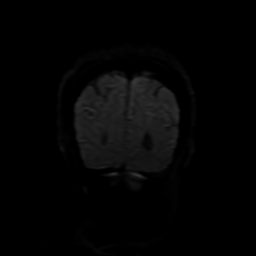
[im 76/76]
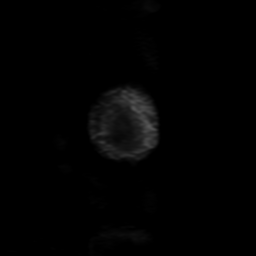

[Series 4: FLAIR · sagittal · 5.0mm · 0.23mm/px · 2 of 26 slices shown (1 of 2)]
[im 1/26]
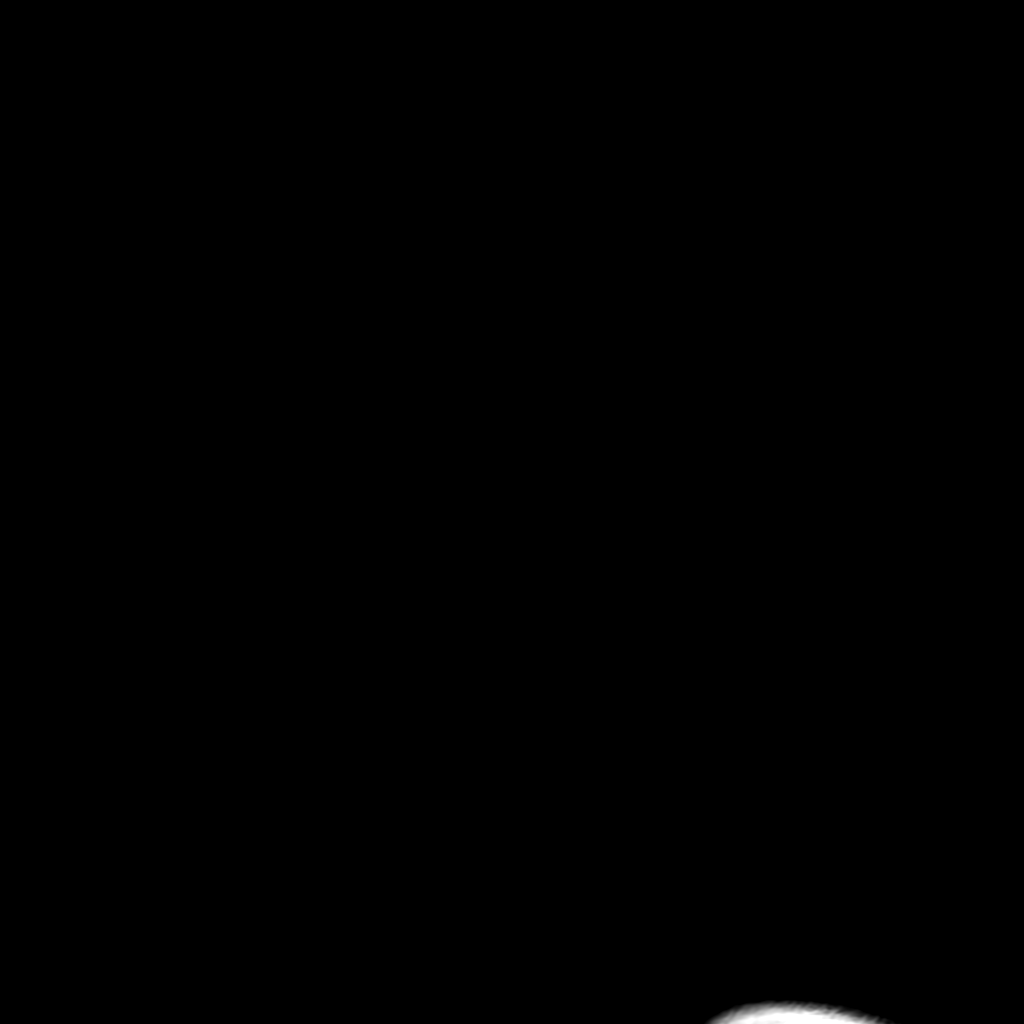
[im 26/26]
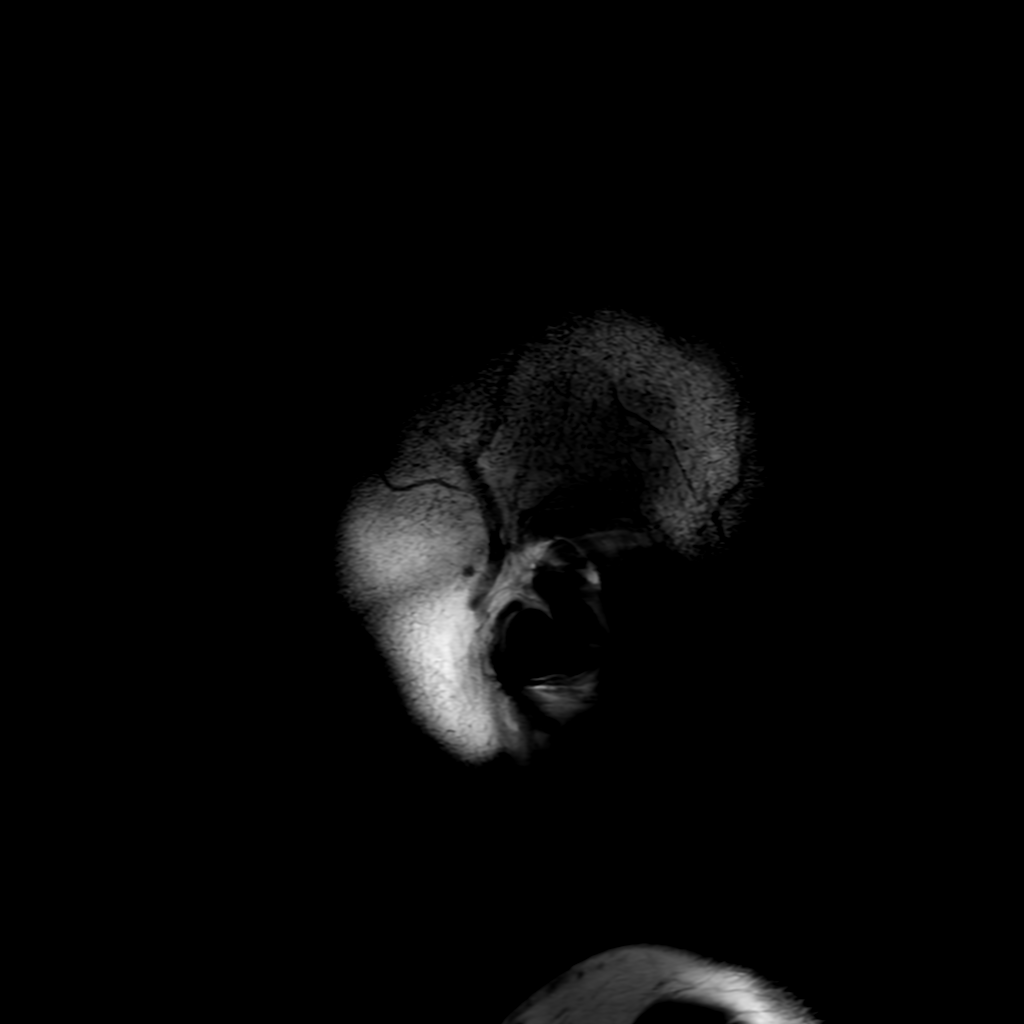

[Series 6: FLAIR · axial · 3.0mm · 0.45mm/px · z∈[-92,+63]mm · 2 of 27 slices shown (2 of 2)]
[im 1/27]
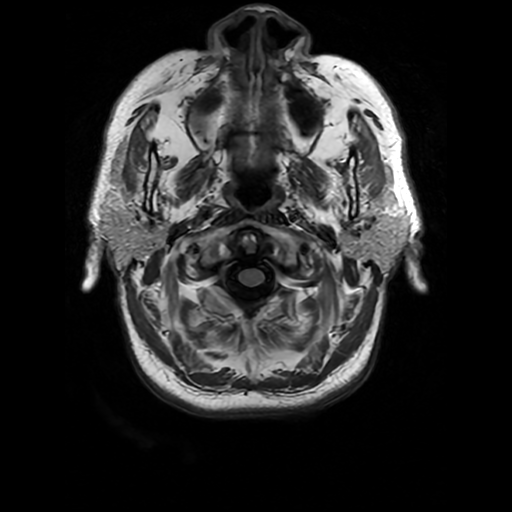
[im 27/27]
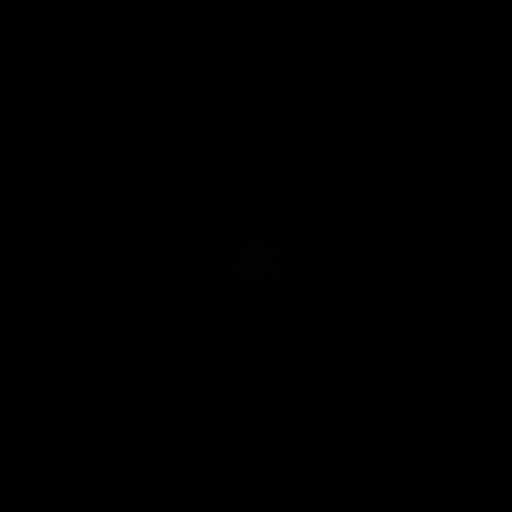

[Series 250: ADC · axial · 3.0mm · 0.94mm/px · z∈[-102,+51]mm · 4 of 53 slices shown (1 of 2)]
[im 1/53]
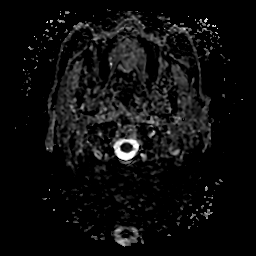
[im 18/53]
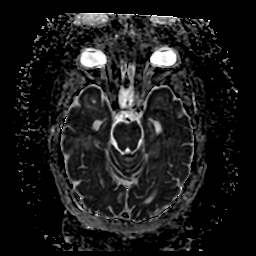
[im 35/53]
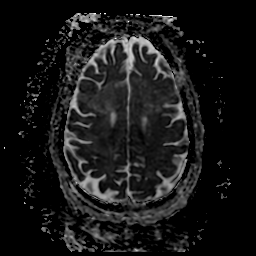
[im 53/53]
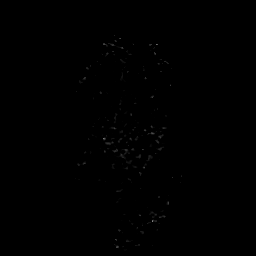

[Series 350: ADC · coronal · 4.0mm · 0.94mm/px · 3 of 38 slices shown (2 of 2)]
[im 1/38]
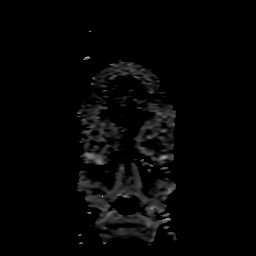
[im 19/38]
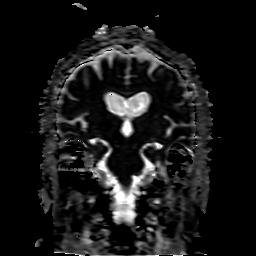
[im 38/38]
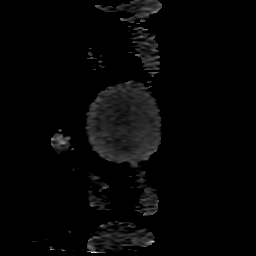

[24 of 48 positions shown; findings below may reference images not displayed]

FINDINGS: MRI HEAD FINDINGS

Brain:

Mild generalized cerebral and cerebellar atrophy.

Mild multifocal T2/FLAIR hyperintensity within the cerebral white
matter, nonspecific but most often secondary to chronic small vessel
ischemia. Findings have progressed as compared to the brain MRI of
12/19/2009.

[DATE] x 0.6 cm dural-based mass along the right aspect of the anterior
falx, likely reflecting an incidental meningioma (series 6, image
20).

There is no acute infarct.

No chronic intracranial blood products.

No extra-axial fluid collection.

No midline shift.

Vascular: Expected proximal arterial flow voids.

Skull and upper cervical spine: No focal marrow lesion.

Sinuses/Orbits: Visualized orbits show no acute finding. No
significant paranasal sinus disease.

MRA HEAD FINDINGS

Anterior circulation:

The intracranial internal carotid arteries are patent. The M1 middle
cerebral arteries are patent. No M2 proximal branch occlusion or
high-grade proximal stenosis is identified. The anterior cerebral
arteries are patent. No intracranial aneurysm is identified.

Posterior circulation:

The visualized intracranial vertebral arteries are patent. The
basilar artery is patent. The posterior cerebral arteries are
patent. Posterior communicating arteries are hypoplastic or absent
bilaterally

Anatomic variants: As described
IMPRESSION: MRI brain:

1. No evidence of acute infarction
2. 1.2 x 0.6 cm dural-based mass along the right aspect of the
anterior falx, likely reflecting an incidental meningioma.
3. Mild generalized parenchymal atrophy and cerebral white matter
chronic small vessel ischemic disease, progressed as compared to the
brain MRI of 12/19/2009.

MRA head:

Unremarkable MR angiography of the head. No intracranial large
vessel occlusion or proximal high-grade arterial stenosis.

## 2021-07-30 IMAGING — MR MR MRA HEAD W/O CM
2 series · 18 of 48 positions shown · non-contrast
Comparison: No pertinent prior exam.
COMPARISON: Noncontrast head CT performed earlier today 04/08/2021.
CT angiogram head/neck 03/07/2021. Brain MRI 12/19/2009.

CLINICAL DATA: Neuro deficit, acute, stroke suspected.

EXAM:
MRI HEAD WITHOUT CONTRAST
MRA HEAD WITHOUT CONTRAST
TECHNIQUE: Multiplanar, multi-echo pulse sequences of the brain and surrounding
structures were acquired without intravenous contrast. Angiographic
images of the Circle of Willis were acquired using MRA technique
without intravenous contrast.

[Series 4: ax (id) · axial · 1.0mm · 0.43mm/px · z∈[-76,+6]mm · 17 of 176 slices shown]
[im 1/176]
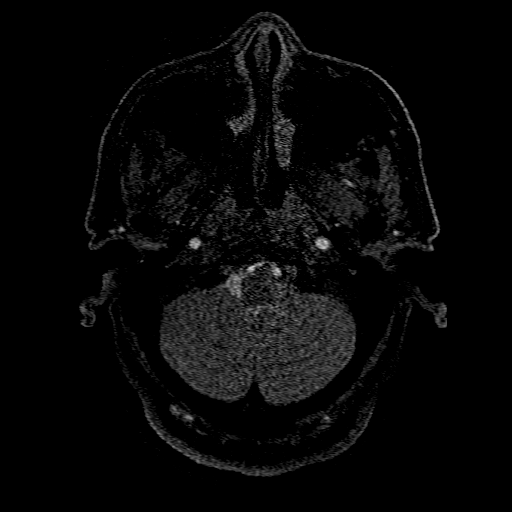
[im 4/176]
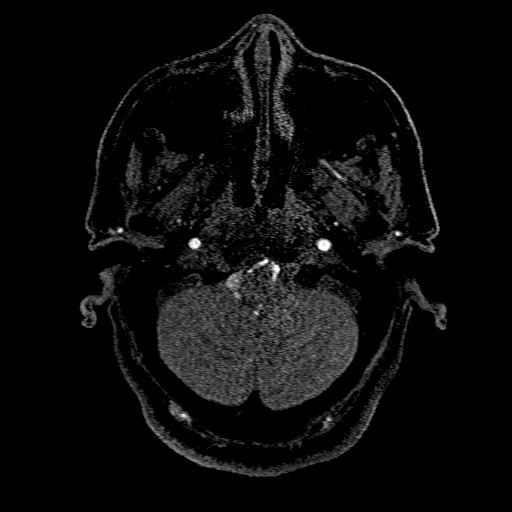
[im 8/176]
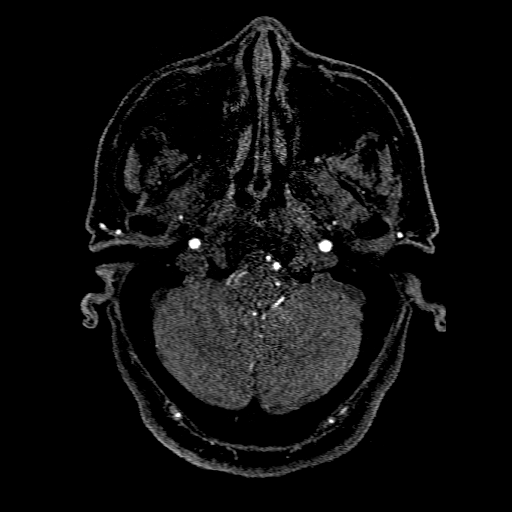
[im 12/176]
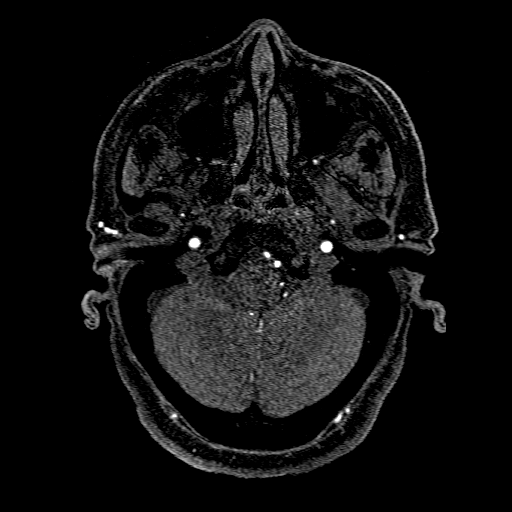
[im 16/176]
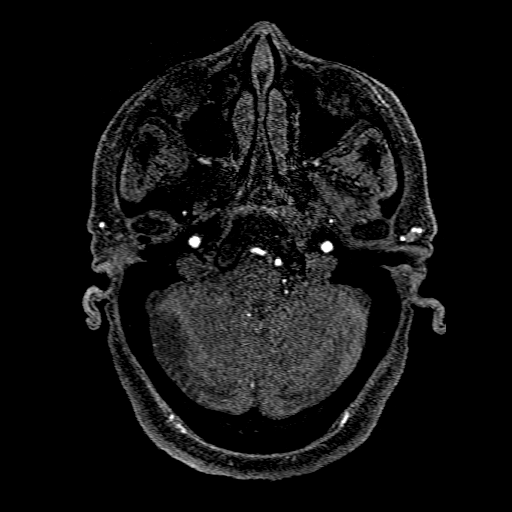
[im 20/176]
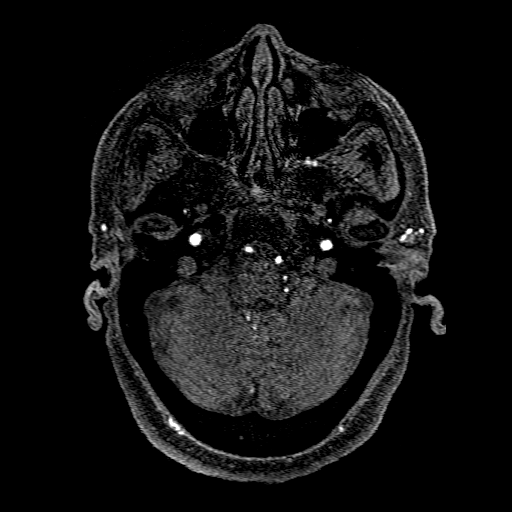
[im 23/176]
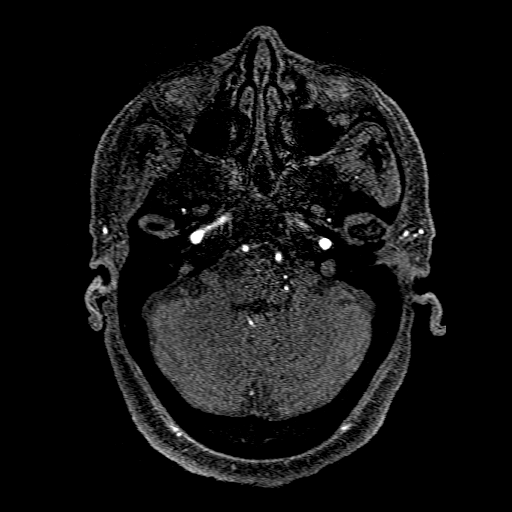
[im 27/176]
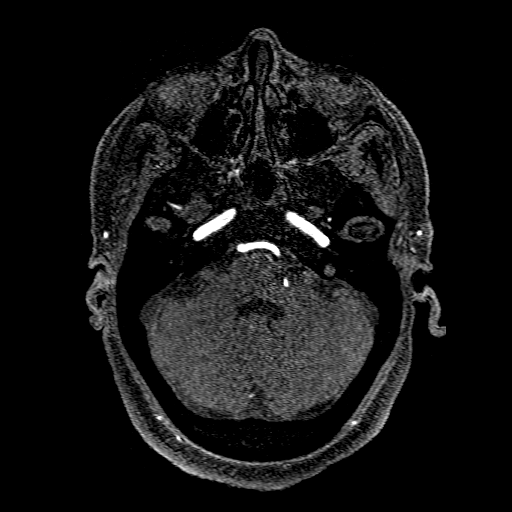
[im 31/176]
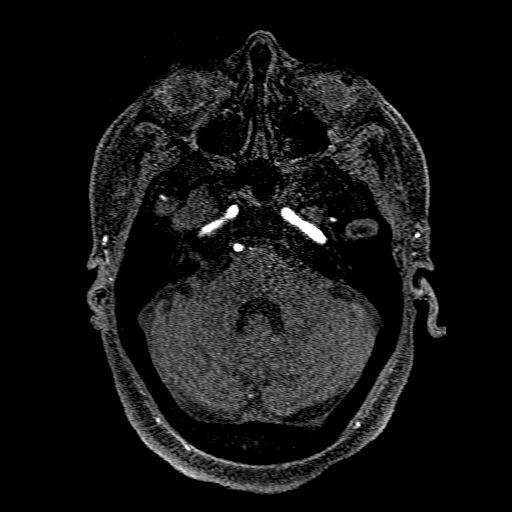
[im 54/176]
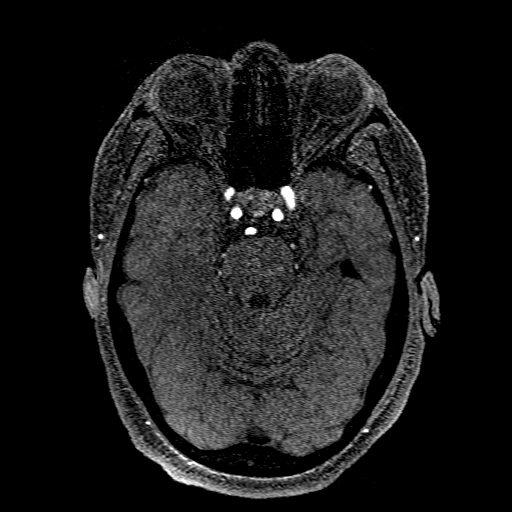
[im 77/176]
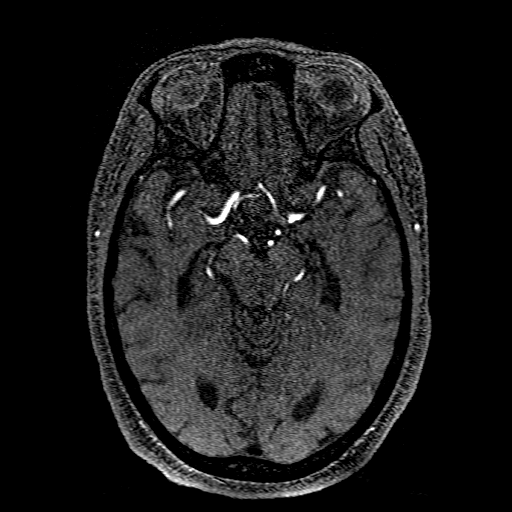
[im 88/176]
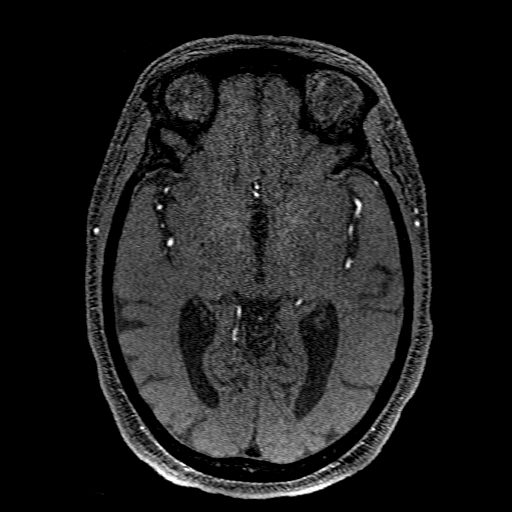
[im 99/176]
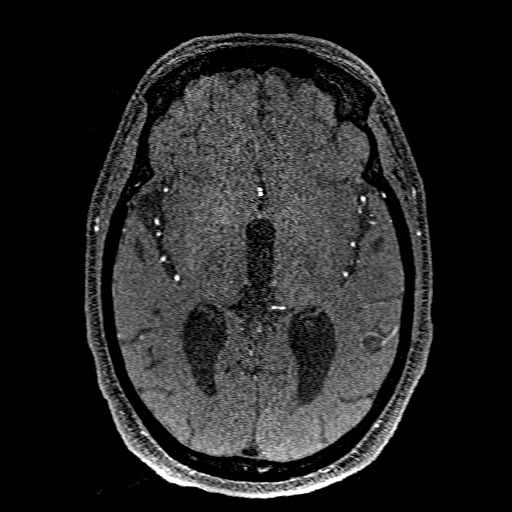
[im 122/176]
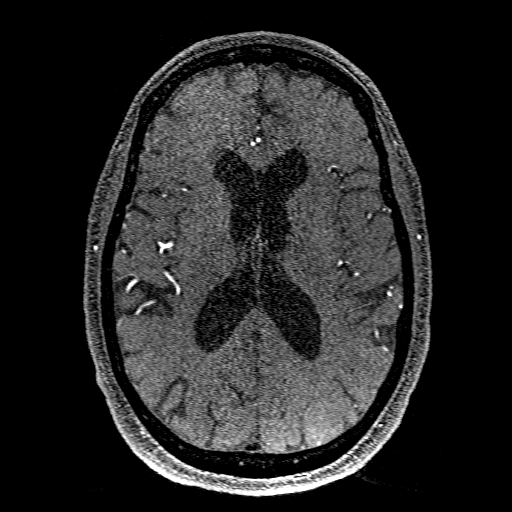
[im 145/176]
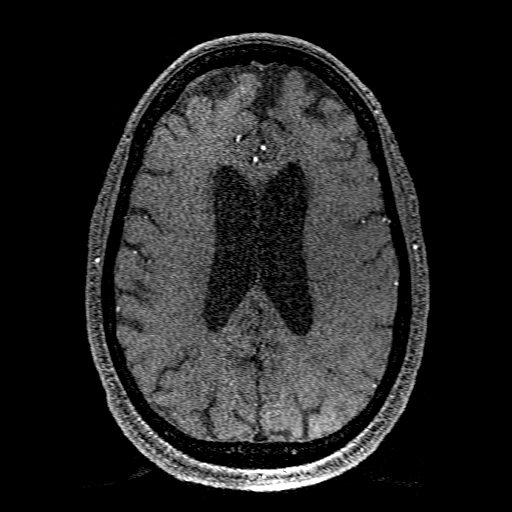
[im 149/176]
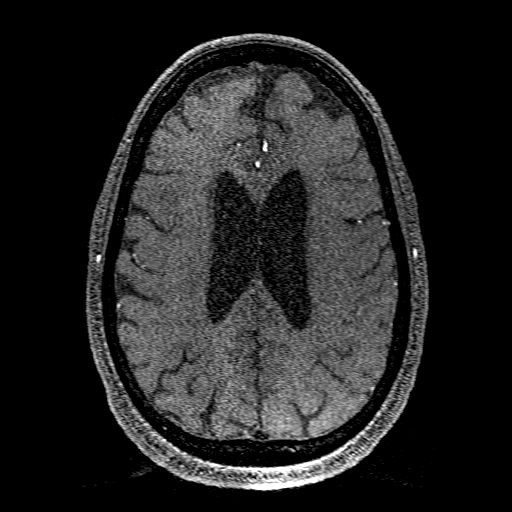
[im 168/176]
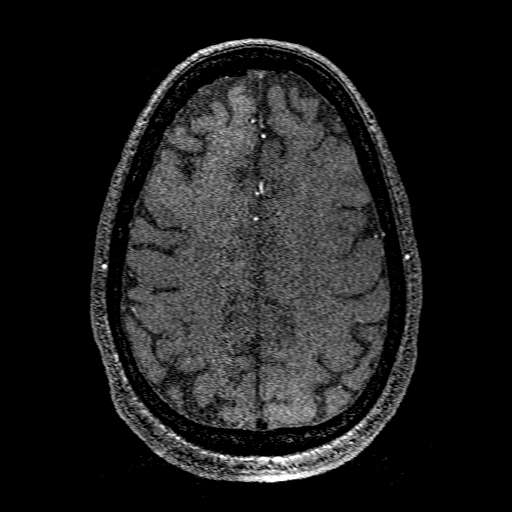

[Series 401: pjn:ax (id) · sagittal · 1.0mm · 0.43mm/px · 1 of 4 slices shown]
[im 1/4]
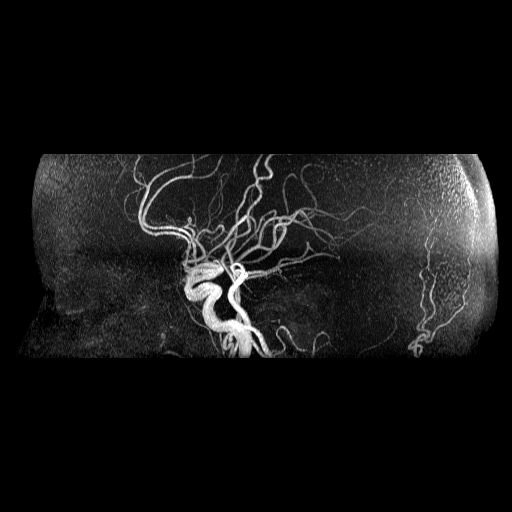

[18 of 48 positions shown; findings below may reference images not displayed]

FINDINGS: MRI HEAD FINDINGS

Brain:

Mild generalized cerebral and cerebellar atrophy.

Mild multifocal T2/FLAIR hyperintensity within the cerebral white
matter, nonspecific but most often secondary to chronic small vessel
ischemia. Findings have progressed as compared to the brain MRI of
12/19/2009.

[DATE] x 0.6 cm dural-based mass along the right aspect of the anterior
falx, likely reflecting an incidental meningioma (series 6, image
20).

There is no acute infarct.

No chronic intracranial blood products.

No extra-axial fluid collection.

No midline shift.

Vascular: Expected proximal arterial flow voids.

Skull and upper cervical spine: No focal marrow lesion.

Sinuses/Orbits: Visualized orbits show no acute finding. No
significant paranasal sinus disease.

MRA HEAD FINDINGS

Anterior circulation:

The intracranial internal carotid arteries are patent. The M1 middle
cerebral arteries are patent. No M2 proximal branch occlusion or
high-grade proximal stenosis is identified. The anterior cerebral
arteries are patent. No intracranial aneurysm is identified.

Posterior circulation:

The visualized intracranial vertebral arteries are patent. The
basilar artery is patent. The posterior cerebral arteries are
patent. Posterior communicating arteries are hypoplastic or absent
bilaterally

Anatomic variants: As described
IMPRESSION: MRI brain:

1. No evidence of acute infarction
2. 1.2 x 0.6 cm dural-based mass along the right aspect of the
anterior falx, likely reflecting an incidental meningioma.
3. Mild generalized parenchymal atrophy and cerebral white matter
chronic small vessel ischemic disease, progressed as compared to the
brain MRI of 12/19/2009.

MRA head:

Unremarkable MR angiography of the head. No intracranial large
vessel occlusion or proximal high-grade arterial stenosis.

## 2021-08-02 ENCOUNTER — Other Ambulatory Visit: Payer: Self-pay

## 2021-08-02 DIAGNOSIS — F411 Generalized anxiety disorder: Secondary | ICD-10-CM

## 2021-08-02 MED ORDER — CLONAZEPAM 0.5 MG PO TABS: 0.5000 mg | ORAL_TABLET | Freq: Two times a day (BID) | ORAL | 1 refills | Status: DC | PRN

## 2021-08-02 NOTE — Telephone Encounter (Signed)
PDMP reviewed. Refill sent to pharmacy.   Dorris Singh, MD  Family Medicine Teaching Service

## 2021-08-02 NOTE — Telephone Encounter (Signed)
Received phone call from patient's husband regarding issues with clonazepam refill. Called pharmacy. Pharmacist reports that resident DEA is not being accepted for medication. Attempted override was unsuccessful.  Sending to preceptor to resend medication.   Talbot Grumbling, RN

## 2021-08-12 ENCOUNTER — Other Ambulatory Visit: Payer: Self-pay | Admitting: Student

## 2021-08-12 DIAGNOSIS — E119 Type 2 diabetes mellitus without complications: Secondary | ICD-10-CM

## 2021-08-12 DIAGNOSIS — I10 Essential (primary) hypertension: Secondary | ICD-10-CM

## 2021-09-01 ENCOUNTER — Other Ambulatory Visit: Payer: Self-pay

## 2021-09-01 DIAGNOSIS — G479 Sleep disorder, unspecified: Secondary | ICD-10-CM

## 2021-09-04 MED ORDER — QUETIAPINE FUMARATE 50 MG PO TABS: ORAL_TABLET | ORAL | 0 refills | Status: DC

## 2021-09-04 MED ORDER — CITALOPRAM HYDROBROMIDE 20 MG PO TABS: 20.0000 mg | ORAL_TABLET | Freq: Every day | ORAL | 0 refills | Status: DC

## 2021-09-27 ENCOUNTER — Other Ambulatory Visit: Payer: Self-pay | Admitting: Family Medicine

## 2021-10-03 ENCOUNTER — Other Ambulatory Visit: Payer: Self-pay | Admitting: Family Medicine

## 2021-10-16 ENCOUNTER — Other Ambulatory Visit: Payer: Self-pay | Admitting: Family Medicine

## 2021-10-16 DIAGNOSIS — L301 Dyshidrosis [pompholyx]: Secondary | ICD-10-CM

## 2021-10-22 ENCOUNTER — Other Ambulatory Visit: Payer: Self-pay | Admitting: Student

## 2021-10-22 DIAGNOSIS — E119 Type 2 diabetes mellitus without complications: Secondary | ICD-10-CM

## 2021-11-15 ENCOUNTER — Other Ambulatory Visit: Payer: Self-pay | Admitting: Student

## 2021-11-15 ENCOUNTER — Other Ambulatory Visit: Payer: Self-pay | Admitting: Family Medicine

## 2021-11-15 DIAGNOSIS — E119 Type 2 diabetes mellitus without complications: Secondary | ICD-10-CM

## 2021-11-15 DIAGNOSIS — E039 Hypothyroidism, unspecified: Secondary | ICD-10-CM

## 2021-11-15 DIAGNOSIS — I10 Essential (primary) hypertension: Secondary | ICD-10-CM

## 2021-11-27 ENCOUNTER — Other Ambulatory Visit: Payer: Self-pay | Admitting: Student

## 2021-11-27 ENCOUNTER — Other Ambulatory Visit: Payer: Self-pay | Admitting: Family Medicine

## 2021-11-27 DIAGNOSIS — G479 Sleep disorder, unspecified: Secondary | ICD-10-CM

## 2021-11-27 DIAGNOSIS — E039 Hypothyroidism, unspecified: Secondary | ICD-10-CM

## 2021-11-27 DIAGNOSIS — E119 Type 2 diabetes mellitus without complications: Secondary | ICD-10-CM

## 2021-12-22 ENCOUNTER — Other Ambulatory Visit: Payer: Self-pay | Admitting: Student

## 2022-01-03 ENCOUNTER — Other Ambulatory Visit: Payer: Self-pay | Admitting: Student

## 2022-01-03 DIAGNOSIS — E119 Type 2 diabetes mellitus without complications: Secondary | ICD-10-CM

## 2022-01-30 ENCOUNTER — Other Ambulatory Visit: Payer: Self-pay | Admitting: Student

## 2022-01-30 DIAGNOSIS — E119 Type 2 diabetes mellitus without complications: Secondary | ICD-10-CM

## 2022-02-26 ENCOUNTER — Other Ambulatory Visit: Payer: Self-pay | Admitting: Student

## 2022-02-26 DIAGNOSIS — E119 Type 2 diabetes mellitus without complications: Secondary | ICD-10-CM

## 2022-02-26 DIAGNOSIS — I10 Essential (primary) hypertension: Secondary | ICD-10-CM

## 2022-03-09 ENCOUNTER — Other Ambulatory Visit: Payer: Self-pay | Admitting: Student

## 2022-03-09 DIAGNOSIS — E119 Type 2 diabetes mellitus without complications: Secondary | ICD-10-CM

## 2022-03-14 ENCOUNTER — Other Ambulatory Visit: Payer: Self-pay | Admitting: Student

## 2022-03-14 DIAGNOSIS — E119 Type 2 diabetes mellitus without complications: Secondary | ICD-10-CM

## 2022-03-19 ENCOUNTER — Other Ambulatory Visit: Payer: Self-pay | Admitting: Student

## 2022-04-11 ENCOUNTER — Other Ambulatory Visit: Payer: Self-pay

## 2022-04-11 MED ORDER — CLOPIDOGREL BISULFATE 75 MG PO TABS: 75.0000 mg | ORAL_TABLET | Freq: Every day | ORAL | 0 refills | Status: DC

## 2022-05-10 ENCOUNTER — Other Ambulatory Visit: Payer: Self-pay | Admitting: Student

## 2022-05-10 DIAGNOSIS — E119 Type 2 diabetes mellitus without complications: Secondary | ICD-10-CM

## 2022-05-14 ENCOUNTER — Other Ambulatory Visit: Payer: Self-pay

## 2022-05-14 DIAGNOSIS — F411 Generalized anxiety disorder: Secondary | ICD-10-CM

## 2022-05-15 MED ORDER — CLONAZEPAM 0.5 MG PO TABS: 0.5000 mg | ORAL_TABLET | Freq: Two times a day (BID) | ORAL | 1 refills | Status: DC | PRN

## 2022-05-15 NOTE — Telephone Encounter (Signed)
Called daughter to provide with update. Daughter states that there is another issue at the pharmacy. Called pharmacy. There is an issue with with resident DEA number and they are needing an attending to resend.   Forwarding to Dr. Owens Shark (afternoon preceptor)  Canceled current rx. Pending new rx to be resent.   Talbot Grumbling, RN

## 2022-05-15 NOTE — Telephone Encounter (Signed)
Patient's daughter returns call to nurse line. Reports concerns with patient being out of med x 2 days and possible side effects.   Provider paged at [phone].  Talbot Grumbling, RN

## 2022-05-15 NOTE — Telephone Encounter (Signed)
Patient's daughter returns call to nurse line regarding concerns with receiving prescription refill. Reports that patient has been out of medication for two days. Daughter also reports that they have been trying to get med refilled for the last 48 hours. Our first request was received yesterday afternoon.   Will forward to PCP.   Talbot Grumbling, RN

## 2022-05-16 ENCOUNTER — Other Ambulatory Visit: Payer: Self-pay

## 2022-05-16 DIAGNOSIS — I10 Essential (primary) hypertension: Secondary | ICD-10-CM

## 2022-05-16 DIAGNOSIS — E119 Type 2 diabetes mellitus without complications: Secondary | ICD-10-CM

## 2022-05-16 MED ORDER — LOSARTAN POTASSIUM 50 MG PO TABS: 50.0000 mg | ORAL_TABLET | Freq: Every day | ORAL | 0 refills | Status: DC

## 2022-05-26 ENCOUNTER — Other Ambulatory Visit: Payer: Self-pay | Admitting: Student

## 2022-05-26 DIAGNOSIS — E039 Hypothyroidism, unspecified: Secondary | ICD-10-CM

## 2022-06-05 ENCOUNTER — Ambulatory Visit: Payer: Medicare Other | Admitting: Student

## 2022-06-19 ENCOUNTER — Other Ambulatory Visit: Payer: Self-pay | Admitting: Student

## 2022-06-19 ENCOUNTER — Other Ambulatory Visit: Payer: Self-pay | Admitting: Family Medicine

## 2022-06-19 DIAGNOSIS — E119 Type 2 diabetes mellitus without complications: Secondary | ICD-10-CM

## 2022-06-19 DIAGNOSIS — E785 Hyperlipidemia, unspecified: Secondary | ICD-10-CM

## 2022-07-13 ENCOUNTER — Other Ambulatory Visit: Payer: Self-pay | Admitting: *Deleted

## 2022-07-13 DIAGNOSIS — F411 Generalized anxiety disorder: Secondary | ICD-10-CM

## 2022-07-13 MED ORDER — CLONAZEPAM 0.5 MG PO TABS: 0.5000 mg | ORAL_TABLET | Freq: Two times a day (BID) | ORAL | 1 refills | Status: AC | PRN

## 2022-07-23 ENCOUNTER — Other Ambulatory Visit: Payer: Self-pay | Admitting: Student

## 2022-07-23 DIAGNOSIS — E119 Type 2 diabetes mellitus without complications: Secondary | ICD-10-CM

## 2022-08-20 ENCOUNTER — Other Ambulatory Visit: Payer: Self-pay | Admitting: Student

## 2022-08-20 DIAGNOSIS — E039 Hypothyroidism, unspecified: Secondary | ICD-10-CM

## 2022-08-20 DIAGNOSIS — I10 Essential (primary) hypertension: Secondary | ICD-10-CM

## 2022-08-20 DIAGNOSIS — G479 Sleep disorder, unspecified: Secondary | ICD-10-CM

## 2022-08-20 DIAGNOSIS — E119 Type 2 diabetes mellitus without complications: Secondary | ICD-10-CM

## 2022-09-03 ENCOUNTER — Other Ambulatory Visit: Payer: Self-pay | Admitting: Student

## 2022-09-24 ENCOUNTER — Other Ambulatory Visit: Payer: Self-pay | Admitting: Student

## 2022-09-24 DIAGNOSIS — E119 Type 2 diabetes mellitus without complications: Secondary | ICD-10-CM

## 2022-09-24 DIAGNOSIS — E785 Hyperlipidemia, unspecified: Secondary | ICD-10-CM

## 2022-10-23 ENCOUNTER — Other Ambulatory Visit: Payer: Self-pay | Admitting: Student

## 2022-10-23 DIAGNOSIS — E119 Type 2 diabetes mellitus without complications: Secondary | ICD-10-CM

## 2022-10-25 ENCOUNTER — Other Ambulatory Visit: Payer: Self-pay | Admitting: Student

## 2022-10-25 ENCOUNTER — Ambulatory Visit (INDEPENDENT_AMBULATORY_CARE_PROVIDER_SITE_OTHER): Payer: Medicare Other | Admitting: Family Medicine

## 2022-10-25 VITALS — BP 128/64 | HR 82 | Ht 62.0 in | Wt 155.0 lb

## 2022-10-25 DIAGNOSIS — R238 Other skin changes: Secondary | ICD-10-CM

## 2022-10-25 DIAGNOSIS — R21 Rash and other nonspecific skin eruption: Secondary | ICD-10-CM | POA: Diagnosis not present

## 2022-10-25 DIAGNOSIS — L301 Dyshidrosis [pompholyx]: Secondary | ICD-10-CM

## 2022-10-25 MED ORDER — PREDNISONE 20 MG PO TABS: 40.0000 mg | ORAL_TABLET | Freq: Every day | ORAL | 0 refills | Status: AC

## 2022-10-25 MED ORDER — CLOBETASOL PROPIONATE 0.05 % EX OINT: 1.0000 | TOPICAL_OINTMENT | Freq: Two times a day (BID) | CUTANEOUS | 0 refills | Status: DC

## 2022-10-25 NOTE — Patient Instructions (Addendum)
It was wonderful to see you today.  Please bring ALL of your medications with you to every visit.   Today we talked about:  I am sending in oral steroids for her to take. This should calm down this reaction. She should take 40 mg once daily for 7 days.  She can continue to use the steroid cream (triamcinolone) as needed for itching to her neck and face.  I have sent in a stronger steroid ointment for her hands. She can take this twice a day until the flare calms down. Then she should back off and just take as needed for flares.   Thank you for coming to your visit as scheduled. We have had a large "no-show" problem lately, and this significantly limits our ability to see and care for patients. As a friendly reminder- if you cannot make your appointment please call to cancel. We do have a no show policy for those who do not cancel within 24 hours. Our policy is that if you miss or fail to cancel an appointment within 24 hours, 3 times in a 25-monthperiod, you may be dismissed from our clinic.   Thank you for choosing [facility]   Please call [phone] any questions about today's appointment.  Please be sure to schedule follow up at the front  desk before you leave today.   ASharion Settler DO PGY-3 Family Medicine

## 2022-10-25 NOTE — Assessment & Plan Note (Addendum)
Unclear what caused this rash. No signs of anaphylaxis as no other systems involved. Rash is present in sun-exposed areas but not present on forearms. It crosses midline and does not follow dermatomal distributions so unlikely shingles. The area on neck and face is not vesicular and appears to be different from hand. Could be a component of contact dermatitis vs allergy vs dyshidrotic eczema.  -Prednisone 40 mg x 7 days -Can continue triamcinolone 0.1% ointment to area of neck and face -Clobetasol 0.05% sent for hands -HSV Cx collected from draining vesicles   -Avoid allergens, keep note of any new exposures  -Strict return precautions given

## 2022-10-25 NOTE — Progress Notes (Signed)
    SUBJECTIVE:   CHIEF COMPLAINT / HPI:   [name] is a 71 y.o. female who presents to the [facility] clinic today to discuss the following concerns:   Rash Patient presents today with her daughter. Her daughter relays most of the history for her as patient reports a history of memory loss. Daughter notes that she has a known allergic reaction to coffee, chocolates and nuts. Even still, she consumes these foods regularly. She has been using Triamcinolone cream and 25 mg of Benadryl. She does not have an Epi pen because "it has never been that bad". She normally just gets a mild rash with the foods, but it also doesn't happen all the time either.   Bubbling of rash started overnight. It is burning and painful.  Denies tongue swelling, SOB, N/V.    PERTINENT  PMH / PSH: Hypothyroidism, T2DM, anxiety   OBJECTIVE:   BP 128/64   Pulse 82   Ht '5\' 2"'$  (1.575 m)   Wt 155 lb (70.3 kg)   SpO2 97%   BMI 28.35 kg/m    General: NAD, pleasant, able to participate in exam. No tongue swelling. Cardiac: RRR, no murmurs. Respiratory: CTAB, normal effort, No wheezes, rales or rhonchi Skin: On forehead and around neck/upper chest there is an erythematous and blanchable maculopapular rash. No drainage or vesicles. Her b/l hands are mildly swollen and there are areas of scattered vesicles between fingers and on dorsal hand. There is some crusting and drainage of vesicles between 3rd and 4th fingers on left hand.  Psych: Normal affect and mood          ASSESSMENT/PLAN:   Rash Unclear what caused this rash. No signs of anaphylaxis as no other systems involved. Rash is present in sun-exposed areas but not present on forearms. It crosses midline and does not follow dermatomal distributions so unlikely shingles. The area on neck and face is not vesicular and appears to be different from hand. Could be a component of contact dermatitis vs allergy vs dyshidrotic eczema.  -Prednisone 40 mg x 7  days -Can continue triamcinolone 0.1% ointment to area of neck and face -Clobetasol 0.05% sent for hands -HSV Cx collected from draining vesicles   -Avoid allergens, keep note of any new exposures  -Strict return precautions given    Sharion Settler, [place]

## 2022-10-29 ENCOUNTER — Telehealth: Payer: Self-pay

## 2022-10-29 ENCOUNTER — Other Ambulatory Visit: Payer: Self-pay | Admitting: Family Medicine

## 2022-10-29 LAB — HERPES SIMPLEX VIRUS CULTURE

## 2022-10-29 MED ORDER — CLOBETASOL PROPIONATE 0.05 % EX OINT: 1.0000 | TOPICAL_OINTMENT | Freq: Two times a day (BID) | CUTANEOUS | 0 refills | Status: DC

## 2022-10-29 NOTE — Telephone Encounter (Signed)
Received fax from [facility] asking for a refill of the following medication for the pt.  Hydrocortisone 2.5% CRM Apply cream to neck twice daily.  Please send Rx if appropriate.  Ottis Stain, CMA

## 2022-10-31 ENCOUNTER — Other Ambulatory Visit: Payer: Self-pay

## 2022-10-31 DIAGNOSIS — E119 Type 2 diabetes mellitus without complications: Secondary | ICD-10-CM

## 2022-10-31 DIAGNOSIS — I10 Essential (primary) hypertension: Secondary | ICD-10-CM

## 2022-10-31 MED ORDER — LOSARTAN POTASSIUM 50 MG PO TABS: 50.0000 mg | ORAL_TABLET | Freq: Every day | ORAL | 0 refills | Status: DC

## 2022-11-19 ENCOUNTER — Other Ambulatory Visit: Payer: Self-pay | Admitting: Student

## 2022-11-19 DIAGNOSIS — I10 Essential (primary) hypertension: Secondary | ICD-10-CM

## 2022-11-19 DIAGNOSIS — E039 Hypothyroidism, unspecified: Secondary | ICD-10-CM

## 2022-11-19 DIAGNOSIS — E119 Type 2 diabetes mellitus without complications: Secondary | ICD-10-CM

## 2022-11-29 ENCOUNTER — Other Ambulatory Visit: Payer: Self-pay | Admitting: Student

## 2022-11-29 DIAGNOSIS — E119 Type 2 diabetes mellitus without complications: Secondary | ICD-10-CM

## 2022-12-24 ENCOUNTER — Other Ambulatory Visit: Payer: Self-pay | Admitting: Student

## 2022-12-24 DIAGNOSIS — E785 Hyperlipidemia, unspecified: Secondary | ICD-10-CM

## 2022-12-28 ENCOUNTER — Other Ambulatory Visit: Payer: Self-pay | Admitting: Student

## 2022-12-28 DIAGNOSIS — E119 Type 2 diabetes mellitus without complications: Secondary | ICD-10-CM

## 2023-02-01 ENCOUNTER — Other Ambulatory Visit: Payer: Self-pay | Admitting: Student

## 2023-02-01 DIAGNOSIS — E119 Type 2 diabetes mellitus without complications: Secondary | ICD-10-CM

## 2023-02-21 ENCOUNTER — Other Ambulatory Visit: Payer: Self-pay | Admitting: Student

## 2023-02-21 DIAGNOSIS — E039 Hypothyroidism, unspecified: Secondary | ICD-10-CM

## 2023-02-27 ENCOUNTER — Other Ambulatory Visit: Payer: Self-pay | Admitting: Student

## 2023-02-27 DIAGNOSIS — G479 Sleep disorder, unspecified: Secondary | ICD-10-CM

## 2023-04-01 ENCOUNTER — Other Ambulatory Visit: Payer: Self-pay | Admitting: Student

## 2023-04-01 DIAGNOSIS — E785 Hyperlipidemia, unspecified: Secondary | ICD-10-CM

## 2023-04-02 ENCOUNTER — Telehealth: Payer: Self-pay | Admitting: Student

## 2023-04-02 NOTE — Telephone Encounter (Signed)
Contacted [name] to schedule their annual wellness visit. Appointment made for 04/04/2023.  Thank you,  [facility] Support [facility] Direct dial  [phone]

## 2023-04-03 NOTE — Progress Notes (Signed)
I connected with  [name], husband [name] on 04/04/2023 by a audio enabled telemedicine application and verified that I am speaking with the correct person using two identifiers.  Patient Location: Home  Provider Location: Home Office  I discussed the limitations of evaluation and management by telemedicine. The patient expressed understanding and agreed to proceed.   Subjective:   [name] is a 72 y.o. female who presents for an Initial Medicare Annual Wellness Visit.  Review of Systems    Per HPI unless specifically indicated below. Cardiac Risk Factors include: advanced age (>75men, >60 women);female gender, Hypertension, and Hyperlipidemia.          Objective:       10/25/2022    3:17 PM 10/25/2022    2:21 PM 05/22/2021    2:12 PM  Vitals with BMI  Height  5\' 2"  5\' 2"   Weight  155 lbs 156 lbs  BMI  28.34 28.53  Systolic 128 136 401  Diastolic 64 74 87  Pulse  82 62    There were no vitals filed for this visit. There is no height or weight on file to calculate BMI.     10/25/2022    2:22 PM 05/18/2021    1:44 PM 04/08/2021    8:16 PM 12/21/2020   10:04 AM 09/21/2020   10:19 AM 06/15/2020    3:48 PM 05/02/2020   11:03 AM  Advanced Directives  Does Patient Have a Medical Advance Directive? No No Yes No No No No  Type of Surveyor, minerals;Living will      Does patient want to make changes to medical advance directive?   No - Patient declined  No - Patient declined    Copy of Healthcare Power of Attorney in Chart?   No - copy requested      Would patient like information on creating a medical advance directive? No - Patient declined No - Patient declined  Yes (MAU/Ambulatory/Procedural Areas - Information given)  No - Patient declined No - Patient declined    Current Medications (verified) Outpatient Encounter Medications as of 04/04/2023  Medication Sig   atorvastatin (LIPITOR) 80 MG tablet Take 1 tablet by  mouth once daily   citalopram (CELEXA) 20 MG tablet Take 1 tablet by mouth once daily   clobetasol ointment (TEMOVATE) 0.05 % Apply 1 Application topically 2 (two) times daily.   clonazePAM (KLONOPIN) 0.5 MG tablet Take 1 tablet (0.5 mg total) by mouth 2 (two) times daily as needed. for anxiety   clopidogrel (PLAVIX) 75 MG tablet Take 1 tablet by mouth once daily   levothyroxine (SYNTHROID) 75 MCG tablet TAKE 1 TABLET BY MOUTH ONCE DAILY BEFORE BREAKFAST   losartan (COZAAR) 50 MG tablet Take 1 tablet by mouth once daily   metFORMIN (GLUCOPHAGE) 500 MG tablet TAKE 1 TABLET BY MOUTH TWICE DAILY WITH A MEAL   QUEtiapine (SEROQUEL) 50 MG tablet TAKE 1/2 (ONE-HALF) TABLET BY MOUTH AT BEDTIME   SUMAtriptan (IMITREX) 25 MG tablet Take 1 tablet (25 mg total) by mouth every 2 (two) hours as needed for migraine. May repeat in 2 hours if headache persists or recurs.   triamcinolone cream (KENALOG) 0.1 % APPLY ONE APPLICATION TOPICALLY TWICE DAILY.   levETIRAcetam (KEPPRA) 500 MG tablet Take 3 tablets (1,500 mg total) by mouth 2 (two) times daily.   No facility-administered encounter medications on file as of 04/04/2023.    Allergies (verified) Chocolate, Coffee bean extract [  coffea arabica], Peanut-containing drug products, and Topiramate   History: Past Medical History:  Diagnosis Date   Allergy    Anxiety    Arthritis    Cerebral atrophy (HCC) 05/17/2021   Cough due to ACE inhibitor 06/16/2020   CVA (cerebral vascular accident) (HCC) 04/08/2021   Diabetes mellitus type 2, controlled (HCC) 04/21/2015   Headache 04/11/2021   Hearing loss 05/18/2021   History of panic attacks 05/19/2021   Hx of adenomatous colonic polyps 01/26/2015   and also 2021 - recall 2024   Hyperlipidemia    Hypertension    Memory loss of unknown cause 03/04/2002   suspect depression with psychotic features. patient hospitalized and work-up negative.    Panic attack    5-6 a day per pt and husband    Pompholyx eczema  05/30/2018   Seizure (HCC) 02/23/2010   Qualifier: Diagnosis of  By: Deirdre Priest MD, [place]     Seizures [facility])    per husband last seizure 3 yeras ago- documented 06-29-2020   Thyroid disease    thyroid surgery when young   TIA (transient ischemic attack) 04/08/2021   Urge incontinence 05/18/2021   Past Surgical History:  Procedure Laterality Date   BUNIONECTOMY Bilateral    COLONOSCOPY     THYROIDECTOMY, PARTIAL     TONSILLECTOMY     Family History  Problem Relation Age of Onset   Coronary artery disease Sister    Cancer - Prostate Paternal Grandfather    Colon cancer Neg Hx    Rectal cancer Neg Hx    Stomach cancer Neg Hx    Colon polyps Neg Hx    Esophageal cancer Neg Hx    Social History   Socioeconomic History   Marital status: Married    Spouse name: [name]   Number of children: 4   Years of education: Not on file   Highest education level: Not on file  Occupational History   Not on file  Tobacco Use   Smoking status: Never   Smokeless tobacco: Never  Vaping Use   Vaping Use: Never used  Substance and Sexual Activity   Alcohol use: No    Alcohol/week: 0.0 standard drinks of alcohol   Drug use: No   Sexual activity: Not on file  Other Topics Concern   Not on file  Social History Narrative   Social Information 05/17/2021   Patient lives with husband  ,has 2 daughters . Patient enjoys watching bird, doing puzzles and watching TV .  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  Has 12 siblings and raised on a farm.   Primary family support persons is her husband of 27 years.    Transportation to appointments provided by husband or daughter;   ( she no longer drives, last drove about 2 years ago)   Strengths:Active sense of humor   Scientist, water quality   Motivation for treatment/growth   Supportive family/friends      Basic Activities of Daily Living    Dressing:Self-care   Eating: Self-care  ( husband does all of  the cooking)   Ambulation: Self-care   Toileting: Self-care   Bathing: Self-care       Instrumental Activities of Daily Living   Shopping: Total assistance   House/Yard Work: Partial assistance   Administration of medications: Partial assistance with reminders   Finances: Total assistance   Telephone: Self-care   Transportation: Total assistance   Social Determinants of Health  Financial Resource Strain: Low Risk  (04/04/2023)   Overall Financial Resource Strain (CARDIA)    Difficulty of Paying Living Expenses: Not hard at all  Food Insecurity: No Food Insecurity (04/04/2023)   Hunger Vital Sign    Worried About Running Out of Food in the Last Year: Never true    Ran Out of Food in the Last Year: Never true  Transportation Needs: No Transportation Needs (04/04/2023)   PRAPARE - Administrator, Civil Service (Medical): No    Lack of Transportation (Non-Medical): No  Physical Activity: Insufficiently Active (04/04/2023)   Exercise Vital Sign    Days of Exercise per Week: 7 days    Minutes of Exercise per Session: 10 min  Stress: Stress Concern Present (04/04/2023)   [employer] of Occupational Health - Occupational Stress Questionnaire    Feeling of Stress : To some extent  Social Connections: Socially Integrated (04/04/2023)   Social Connection and Isolation Panel [NHANES]    Frequency of Communication with Friends and Family: More than three times a week    Frequency of Social Gatherings with Friends and Family: Once a week    Attends Religious Services: More than 4 times per year    Active Member of [employer] or Organizations: Yes    Attends Banker Meetings: 1 to 4 times per year    Marital Status: Married    Tobacco Counseling Counseling given: No   Clinical Intake:  Pre-visit preparation completed: No  Pain : No/denies pain     Nutritional Status: BMI of 19-24  Normal Nutritional Risks: None Diabetes: No  How often do you need to have  someone help you when you read instructions, pamphlets, or other written materials from your doctor or pharmacy?: 4 - Often  Diabetic?Nutrition Risk Assessment:  Has the patient had any N/V/D within the last 2 months?  No  Does the patient have any non-healing wounds?  No  Has the patient had any unintentional weight loss or weight gain?  No   Diabetes:  Is the patient diabetic?  Yes  If diabetic, was a CBG obtained today?  No  Did the patient bring in their glucometer from home?  No  How often do you monitor your CBG's? never.   Financial Strains and Diabetes Management:  Are you having any financial strains with the device, your supplies or your medication? No .  Does the patient want to be seen by Chronic Care Management for management of their diabetes?  No  Would the patient like to be referred to a Nutritionist or for Diabetic Management?  No   Diabetic Exams:  Diabetic Eye Exam: Overdue for diabetic eye exam. Pt has been advised about the importance in completing this exam. Patient advised to call and schedule an eye exam. Diabetic Foot Exam: Overdue, Pt has been advised about the importance in completing this exam. Pt is scheduled for diabetic foot exam on at next diabetic appointment .   Interpreter Needed?: No  Information entered by :: Laurel Dimmer, CMA   Activities of Daily Living    04/04/2023    9:17 AM  In your present state of health, do you have any difficulty performing the following activities:  Hearing? 0  Vision? 1  Difficulty concentrating or making decisions? 1  Walking or climbing stairs? 0  Dressing or bathing? 0  Doing errands, shopping? 0    Patient Care Team: Jerre Simon, MD as PCP - General (Family Medicine) Malvin Johns, MD (  Inactive) as Referring Physician (Psychiatry)  Indicate any recent Medical Services you may have received from other than [facility] providers in the past year (date may be approximate).     Assessment:   This is a  routine wellness examination for [place].  Hearing/Vision screen No results found.  Dietary issues and exercise activities discussed: Current Exercise Habits: Structured exercise class, Type of exercise: Other - see comments (Stationary bike), Time (Minutes): 10, Frequency (Times/Week): 7, Weekly Exercise (Minutes/Week): 70, Intensity: Mild, Exercise limited by: Other - see comments   Goals Addressed   None    Depression Screen    04/04/2023    9:16 AM 05/18/2021    1:43 PM 03/01/2021    8:47 AM 12/21/2020   10:04 AM 09/21/2020   10:23 AM 06/15/2020    3:49 PM 05/02/2020   11:02 AM  PHQ 2/9 Scores  PHQ - 2 Score 1 6 5 6 6 2 1   PHQ- 9 Score  24 17 15 21       Fall Risk    04/04/2023    9:17 AM 05/18/2021    1:43 PM 12/21/2020   10:03 AM 09/21/2020   10:23 AM 05/02/2020   11:02 AM  Fall Risk   Falls in the past year? 0 1 0 0 0  Number falls in past yr: 0 0 0    Injury with Fall? 0 0 0    Risk for fall due to : No Fall Risks      Follow up Falls evaluation completed        FALL RISK PREVENTION PERTAINING TO THE HOME:  Any stairs in or around the home? No  If so, are there any without handrails? No  Home free of loose throw rugs in walkways, pet beds, electrical cords, etc? Yes  Adequate lighting in your home to reduce risk of falls? Yes   ASSISTIVE DEVICES UTILIZED TO PREVENT FALLS:  Life alert? No  Use of a cane, walker or w/c? No  Grab bars in the bathroom? No  Shower chair or bench in shower? Yes  Elevated toilet seat or a handicapped toilet? Yes   TIMED UP AND GO:  Was the test performed?Unable to perform, virtual appointment      08/22/2017    2:00 PM 03/30/2013    2:00 PM  MMSE - Mini Mental State Exam  Orientation to time 2 4  Orientation to time comments oriented to year and season   Orientation to Place 5 5  Registration 3 3  Attention/ Calculation 5 4  Attention/Calculation-comments wasn't able to count back but was able to spell world backward   Recall 0 2   Language- name 2 objects 2 2  Language- repeat 1 1  Language- follow 3 step command 3 3  Language- read & follow direction 1 1  Write a sentence 1 1  Copy design 1 1  Total score 24 27        04/04/2023    9:21 AM  6CIT Screen  What Year? 4 points  What month? 0 points  What time? 0 points  Count back from 20 0 points  Months in reverse 0 points  Repeat phrase 0 points  Total Score 4 points    Immunizations Immunization History  Administered Date(s) Administered   Fluad Quad(high Dose 65+) 09/21/2020   Influenza,inj,Quad PF,6+ Mos 10/05/2013, 10/20/2014, 01/25/2016, 09/14/2016, 07/24/2017, 12/18/2018   PFIZER Comirnaty(Gray Top)Covid-19 Tri-Sucrose Vaccine 12/21/2020   PFIZER(Purple Top)SARS-COV-2 Vaccination 01/22/2020, 02/17/2020  Pneumococcal Conjugate-13 07/24/2017   Pneumococcal Polysaccharide-23 10/05/2013, 12/19/2018   Td 08/31/2005    TDAP status: Due, Education has been provided regarding the importance of this vaccine. Advised may receive this vaccine at local pharmacy or Health Dept. Aware to provide a copy of the vaccination record if obtained from local pharmacy or Health Dept. Verbalized acceptance and understanding.  Flu Vaccine status: Up to date  Pneumococcal vaccine status: Up to date  Covid-19 vaccine status: Information provided on how to obtain vaccines.   Qualifies for Shingles Vaccine? Yes   Zostavax completed No   Shingrix Completed?: No.    Education has been provided regarding the importance of this vaccine. Patient has been advised to call insurance company to determine out of pocket expense if they have not yet received this vaccine. Advised may also receive vaccine at local pharmacy or Health Dept. Verbalized acceptance and understanding.  Screening Tests Health Maintenance  Topic Date Due   OPHTHALMOLOGY EXAM  Never done   MAMMOGRAM  Never done   Zoster Vaccines- Shingrix (1 of 2) Never done   DTaP/Tdap/Td (2 - Tdap) 09/01/2015    DEXA SCAN  Never done   Diabetic kidney evaluation - Urine ACR  07/23/2018   FOOT EXAM  06/19/2019   HEMOGLOBIN A1C  10/12/2021   Diabetic kidney evaluation - eGFR measurement  04/11/2022   COVID-19 Vaccine (4 - 2023-24 season) 07/27/2022   INFLUENZA VACCINE  06/27/2023   COLONOSCOPY (Pts 45-60yrs Insurance coverage will need to be confirmed)  07/14/2023   Medicare Annual Wellness (AWV)  04/03/2024   Pneumonia Vaccine 69+ Years old  Completed   Hepatitis C Screening  Completed   HPV VACCINES  Aged Out    Health Maintenance  Health Maintenance Due  Topic Date Due   OPHTHALMOLOGY EXAM  Never done   MAMMOGRAM  Never done   Zoster Vaccines- Shingrix (1 of 2) Never done   DTaP/Tdap/Td (2 - Tdap) 09/01/2015   DEXA SCAN  Never done   Diabetic kidney evaluation - Urine ACR  07/23/2018   FOOT EXAM  06/19/2019   HEMOGLOBIN A1C  10/12/2021   Diabetic kidney evaluation - eGFR measurement  04/11/2022   COVID-19 Vaccine (4 - 2023-24 season) 07/27/2022    Colorectal cancer screening: Type of screening: Colonoscopy. Completed 07/13/2020. Repeat every 3 years  Mammogram status: Ordered 04/04/2023. Pt provided with contact info and advised to call to schedule appt.   Bone Density status: Ordered 04/04/2023. Pt provided with contact info and advised to call to schedule appt.  Lung Cancer Screening: (Low Dose CT Chest recommended if Age 60-80 years, 30 pack-year currently smoking OR have quit w/in 15years.) does not qualify.   Lung Cancer Screening Referral: not applicable   Additional Screening:  Hepatitis C Screening: does qualify; Completed 07/23/2017  Vision Screening: Recommended annual ophthalmology exams for early detection of glaucoma and other disorders of the eye. Is the patient up to date with their annual eye exam?  Yes  Who is the provider or what is the name of the office in which the patient attends annual eye exams?  If pt is not established with a provider, would they like  to be referred to a provider to establish care? No .   Dental Screening: Recommended annual dental exams for proper oral hygiene  Community Resource Referral / Chronic Care Management: CRR required this visit?  No   CCM required this visit?  No      Plan:  I have personally reviewed and noted the following in the patient's chart:   Medical and social history Use of alcohol, tobacco or illicit drugs  Current medications and supplements including opioid prescriptions. Patient is not currently taking opioid prescriptions. Functional ability and status Nutritional status Physical activity Advanced directives List of other physicians Hospitalizations, surgeries, and ER visits in previous 12 months Vitals Screenings to include cognitive, depression, and falls Referrals and appointments  In addition, I have reviewed and discussed with patient certain preventive protocols, quality metrics, and best practice recommendations. A written personalized care plan for preventive services as well as general preventive health recommendations were provided to patient.     Lonna Cobb, CMA   04/04/2023  Nurse Notes: Approximately 30 minute Non-Face -To-Face Medicare Wellness Visit

## 2023-04-03 NOTE — Patient Instructions (Signed)

## 2023-04-04 ENCOUNTER — Ambulatory Visit (INDEPENDENT_AMBULATORY_CARE_PROVIDER_SITE_OTHER): Payer: Medicare Other

## 2023-04-04 DIAGNOSIS — Z0000 Encounter for general adult medical examination without abnormal findings: Secondary | ICD-10-CM | POA: Diagnosis not present

## 2023-04-04 DIAGNOSIS — E2839 Other primary ovarian failure: Secondary | ICD-10-CM

## 2023-04-04 DIAGNOSIS — Z1231 Encounter for screening mammogram for malignant neoplasm of breast: Secondary | ICD-10-CM

## 2023-04-15 ENCOUNTER — Encounter: Payer: Self-pay | Admitting: Student

## 2023-04-15 ENCOUNTER — Ambulatory Visit (INDEPENDENT_AMBULATORY_CARE_PROVIDER_SITE_OTHER): Payer: Medicare Other | Admitting: Student

## 2023-04-15 VITALS — BP 182/81 | HR 72 | Ht 62.0 in | Wt 155.1 lb

## 2023-04-15 DIAGNOSIS — E1159 Type 2 diabetes mellitus with other circulatory complications: Secondary | ICD-10-CM

## 2023-04-15 DIAGNOSIS — I10 Essential (primary) hypertension: Secondary | ICD-10-CM | POA: Diagnosis not present

## 2023-04-15 DIAGNOSIS — I152 Hypertension secondary to endocrine disorders: Secondary | ICD-10-CM | POA: Diagnosis not present

## 2023-04-15 DIAGNOSIS — E119 Type 2 diabetes mellitus without complications: Secondary | ICD-10-CM

## 2023-04-15 LAB — POCT GLYCOSYLATED HEMOGLOBIN (HGB A1C): HbA1c, POC (controlled diabetic range): 6.5 % (ref 0.0–7.0)

## 2023-04-15 MED ORDER — LOSARTAN POTASSIUM 100 MG PO TABS
100.0000 mg | ORAL_TABLET | Freq: Every day | ORAL | 2 refills | Status: DC
Start: 2023-04-15 — End: 2023-08-21

## 2023-04-15 NOTE — Assessment & Plan Note (Addendum)
Patient with well-controlled diabetes and A1c of 6.5 which is increased from 2 years ago ago at 6.1.  No episodes of hypoglycemia or symptoms of hypoglycemia.  Completed annual foot exam which was normal. -Obtained A1c, lipid panel and microalbuminuria -Continued metformin 500 mg twice daily -Advised patient to complete her annual diabetes eye exam -Encouraged appropriate dieting and exercise

## 2023-04-15 NOTE — Assessment & Plan Note (Signed)
BP today is elevated at 182/81.  Patient currently asymptomatic without headache, vision changes, shortness of breath or chest pain.  Given poorly  control blood pressure losartan 50 mg would increase medication to 100 mg daily -Increased losartan to 100 mg daily -Encourage patient to keep daily blood pressure diary -Follow-up in 2 weeks to reassess blood pressure and for lab

## 2023-04-15 NOTE — Patient Instructions (Addendum)
It was wonderful to see you today. Thank you for allowing me to be a part of your care. Below is a short summary of what we discussed at your visit today:  Your A1c today is 6.5  Today we ordered lab to check your cholesterol level, kidney function and electrolytes.  Please continue to take your metformin as prescribed.   Please make sure to follow-up with your ophthalmologist and have results sent to [place].  Your blood pressure today is elevated.  I recommend increasing your losartan to 100 mg daily.  Please keep a daily blood pressure diary and follow-up with me in 2 weeks less check your blood pressure at that time.  Please bring all of your medications to every appointment!  If you have any questions or concerns, please do not hesitate to contact us via phone or MyChart message.   Jerre Simon, MD [facility]

## 2023-04-15 NOTE — Progress Notes (Signed)
    SUBJECTIVE:   CHIEF COMPLAINT / HPI:   Diabetes, Type 2 - Last A1c -6.1 (2 years ago) - Medications: Metformin 500 mg twice daily - Compliance: Good - Checking BG at home: No - Diet: Regular diet, drinking lots of iced tea.  "Can do better" - Exercise: The size but tries to be active - Eye exam: Ophthalmologist this year and had bilateral surgery surgery - Foot exam: Due and completed today.  - Microalbumin: Normal from 5 years ago. - Statin: Lipitor 80 mg daily - Denies symptoms of hypoglycemia, polyuria, polydipsia, numbness extremities, foot ulcers/trauma   PERTINENT  PMH / PSH: Reviewed  OBJECTIVE:   BP (!) 182/81   Pulse 72   Ht 5\' 2"  (1.575 m)   Wt 155 lb 2 oz (70.4 kg)   SpO2 100%   BMI 28.37 kg/m    Physical Exam General: Alert, well appearing, NAD Cardiovascular: RRR, No Murmurs, Normal S2/S2 Respiratory: CTAB, No wheezing or Rales Abdomen: No distension or tenderness Extremities: No edema on extremities   Skin: Warm and dry  ASSESSMENT/PLAN:   Diabetes mellitus type 2, controlled (HCC) Patient with well-controlled diabetes and A1c of 6.5 which is increased from 2 years ago ago at 6.1.  No episodes of hypoglycemia or symptoms of hypoglycemia.  Completed annual foot exam which was normal. -Obtained A1c, lipid panel and microalbuminuria -Continued metformin 500 mg twice daily -Advised patient to complete her annual diabetes eye exam -Encouraged appropriate dieting and exercise   Hypertension associated with diabetes (HCC) BP today is elevated at 182/81.  Patient currently asymptomatic without headache, vision changes, shortness of breath or chest pain.  Given poorly  control blood pressure losartan 50 mg would increase medication to 100 mg daily -Increased losartan to 100 mg daily -Encourage patient to keep daily blood pressure diary -Follow-up in 2 weeks to reassess blood pressure and for lab     Isabella Simon, MD [facility]

## 2023-04-16 LAB — BASIC METABOLIC PANEL
BUN/Creatinine Ratio: 8 — ABNORMAL LOW (ref 12–28)
BUN: 6 mg/dL — ABNORMAL LOW (ref 8–27)
CO2: 30 mmol/L — ABNORMAL HIGH (ref 20–29)
Calcium: 9.5 mg/dL (ref 8.7–10.3)
Chloride: 99 mmol/L (ref 96–106)
Creatinine, Ser: 0.73 mg/dL (ref 0.57–1.00)
Glucose: 121 mg/dL — ABNORMAL HIGH (ref 70–99)
Potassium: 3.2 mmol/L — ABNORMAL LOW (ref 3.5–5.2)
Sodium: 143 mmol/L (ref 134–144)
eGFR: 87 mL/min/{1.73_m2} (ref >59)

## 2023-04-16 LAB — LIPID PANEL
Chol/HDL Ratio: 2.3 ratio (ref 0.0–4.4)
Cholesterol, Total: 152 mg/dL (ref 100–199)
HDL: 65 mg/dL (ref >39)
LDL Chol Calc (NIH): 70 mg/dL (ref 0–99)
Triglycerides: 89 mg/dL (ref 0–149)
VLDL Cholesterol Cal: 17 mg/dL (ref 5–40)

## 2023-04-16 LAB — MICROALBUMIN / CREATININE URINE RATIO
Creatinine, Urine: 75.6 mg/dL
Microalb/Creat Ratio: 96 mg/g creat — ABNORMAL HIGH (ref 0–29)
Microalbumin, Urine: 72.7 ug/mL

## 2023-05-03 ENCOUNTER — Other Ambulatory Visit: Payer: Self-pay | Admitting: Student

## 2023-05-03 DIAGNOSIS — E119 Type 2 diabetes mellitus without complications: Secondary | ICD-10-CM

## 2023-06-09 ENCOUNTER — Other Ambulatory Visit: Payer: Self-pay | Admitting: Student

## 2023-06-09 DIAGNOSIS — E039 Hypothyroidism, unspecified: Secondary | ICD-10-CM

## 2023-06-24 ENCOUNTER — Telehealth: Payer: Self-pay

## 2023-06-25 NOTE — Transitions of Care (Post Inpatient/ED Visit) (Unsigned)
06/25/2023  Name: [name] MRN: [id number] DOB: [birth year]  Today's TOC FU Call Status: Today's TOC FU Call Status:: Successful TOC FU Call Competed TOC FU Call Complete Date: 06/25/23  Transition Care Management Follow-up Telephone Call Date of Discharge: 06/22/23 Discharge Facility: Other ([facility]) Name of Other ([facility]) Discharge Facility: [facility] Type of Discharge: Inpatient Admission Primary Inpatient Discharge Diagnosis:: epileptic syndrome How have you been since you were released from the hospital?: Better Any questions or concerns?: No  Items Reviewed: Did you receive and understand the discharge instructions provided?: Yes Medications obtained,verified, and reconciled?: Yes (Medications Reviewed) Any new allergies since your discharge?: No Dietary orders reviewed?: Yes Do you have support at home?: Yes People in Home: spouse  Medications Reviewed Today: Medications Reviewed Today     Reviewed by Karena Addison, LPN (Licensed Practical Nurse) on 06/25/23 at 1220  Med List Status: <None>   Medication Order Taking? Sig Documenting Provider Last Dose Status Informant  atorvastatin (LIPITOR) 80 MG tablet [id number]  Take 1 tablet by mouth once daily Jerre Simon, MD  Active   citalopram (CELEXA) 20 MG tablet [id number]  Take 1 tablet by mouth once daily Jerre Simon, MD  Active   clobetasol ointment (TEMOVATE) 0.05 % [id number]  Apply 1 Application topically 2 (two) times daily. Sabino Dick, DO  Active   clonazePAM (KLONOPIN) 0.5 MG tablet [id number]  Take 1 tablet (0.5 mg total) by mouth 2 (two) times daily as needed. for anxiety Jerre Simon, MD  Active   clopidogrel (PLAVIX) 75 MG tablet [id number]  Take 1 tablet by mouth once daily Jerre Simon, MD  Active   lacosamide (VIMPAT) 50 MG TABS tablet [id number] Yes Take 50 mg by mouth 2 (two) times daily. [provider]  Active   levETIRAcetam (KEPPRA) 500 MG tablet [id number]  Take 3  tablets (1,500 mg total) by mouth 2 (two) times daily. Almon Hercules, MD  Expired 05/22/21 2359   levothyroxine (SYNTHROID) 75 MCG tablet [id number]  TAKE 1 TABLET BY MOUTH ONCE DAILY BEFORE Janne Napoleon, MD  Active   losartan (COZAAR) 100 MG tablet [id number]  Take 1 tablet (100 mg total) by mouth daily. Jerre Simon, MD  Active   metFORMIN (GLUCOPHAGE) 500 MG tablet [id number]  TAKE 1 TABLET BY MOUTH TWICE DAILY WITH A MEAL Jerre Simon, MD  Active   QUEtiapine (SEROQUEL) 50 MG tablet [id number]  TAKE 1/2 (ONE-HALF) TABLET BY MOUTH AT BEDTIME Jerre Simon, MD  Active   SUMAtriptan (IMITREX) 25 MG tablet [id number]  Take 1 tablet (25 mg total) by mouth every 2 (two) hours as needed for migraine. May repeat in 2 hours if headache persists or recurs. McDiarmid, Leighton Roach, MD  Active   triamcinolone cream (KENALOG) 0.1 % [id number]  APPLY ONE APPLICATION TOPICALLY TWICE DAILY. Jerre Simon, MD  Active             Home Care and Equipment/Supplies: Were Home Health Services Ordered?: NA Any new equipment or medical supplies ordered?: NA  Functional Questionnaire: Do you need assistance with bathing/showering or dressing?: No Do you need assistance with meal preparation?: No Do you need assistance with eating?: No Do you have difficulty maintaining continence: No Do you need assistance with getting out of bed/getting out of a chair/moving?: No Do you have difficulty managing or taking your medications?: No  Follow up appointments reviewed: PCP Follow-up appointment confirmed?: No (no avail appt times, sent message to staff to schedule) MD Provider Line Number:[phone] Given:  No Specialist Hospital Follow-up appointment confirmed?: NA Do you need transportation to your follow-up appointment?: No Do you understand care options if your condition(s) worsen?: Yes-patient verbalized understanding    SIGNATURE Karena Addison, LPN [facility] Nurse Health Advisor Direct Dial [phone]

## 2023-06-27 ENCOUNTER — Other Ambulatory Visit: Payer: Self-pay | Admitting: Student

## 2023-06-27 DIAGNOSIS — E785 Hyperlipidemia, unspecified: Secondary | ICD-10-CM

## 2023-07-03 ENCOUNTER — Other Ambulatory Visit: Payer: Self-pay | Admitting: Student

## 2023-07-03 DIAGNOSIS — L301 Dyshidrosis [pompholyx]: Secondary | ICD-10-CM

## 2023-07-23 ENCOUNTER — Other Ambulatory Visit: Payer: Self-pay | Admitting: Student

## 2023-07-23 DIAGNOSIS — E119 Type 2 diabetes mellitus without complications: Secondary | ICD-10-CM

## 2023-08-21 ENCOUNTER — Other Ambulatory Visit: Payer: Self-pay

## 2023-08-21 DIAGNOSIS — E119 Type 2 diabetes mellitus without complications: Secondary | ICD-10-CM

## 2023-08-21 DIAGNOSIS — I10 Essential (primary) hypertension: Secondary | ICD-10-CM

## 2023-08-21 MED ORDER — LOSARTAN POTASSIUM 100 MG PO TABS
100.0000 mg | ORAL_TABLET | Freq: Every day | ORAL | 2 refills | Status: DC
Start: 2023-08-21 — End: 2023-11-04

## 2023-08-22 ENCOUNTER — Other Ambulatory Visit: Payer: Self-pay | Admitting: Student

## 2023-08-22 DIAGNOSIS — G479 Sleep disorder, unspecified: Secondary | ICD-10-CM

## 2023-09-02 ENCOUNTER — Other Ambulatory Visit: Payer: Self-pay | Admitting: Student

## 2023-09-02 DIAGNOSIS — L301 Dyshidrosis [pompholyx]: Secondary | ICD-10-CM

## 2023-09-03 ENCOUNTER — Ambulatory Visit (INDEPENDENT_AMBULATORY_CARE_PROVIDER_SITE_OTHER): Payer: Medicare Other | Admitting: Student

## 2023-09-03 ENCOUNTER — Encounter: Payer: Self-pay | Admitting: Student

## 2023-09-03 VITALS — BP 164/82 | HR 73 | Ht 63.0 in | Wt 149.0 lb

## 2023-09-03 DIAGNOSIS — L509 Urticaria, unspecified: Secondary | ICD-10-CM | POA: Diagnosis present

## 2023-09-03 MED ORDER — CETIRIZINE HCL 10 MG PO TABS: 10.0000 mg | ORAL_TABLET | Freq: Every day | ORAL | 0 refills | Status: DC

## 2023-09-03 MED ORDER — HYDROXYZINE HCL 10 MG PO TABS: 10.0000 mg | ORAL_TABLET | Freq: Every evening | ORAL | 0 refills | Status: DC | PRN

## 2023-09-03 NOTE — Progress Notes (Signed)
    SUBJECTIVE:   CHIEF COMPLAINT / HPI:   [name] is a 72 year-old female here with rash. Her husband provides majority of the history because patient says that she had an event years ago in which she now has extremely poor memory.  The rash started yesterday on her arms and the legs, moved to her trunk and back.  It is very itchy. They deny any fevers, chills, nausea or vomiting.  No chest tightness, shortness of breath or wheezing.  Denies any facial swelling, but says she has had that in the past with the reaction. She has not started any new medications or supplements.  Husband denies that she has had any new lotions, foods, detergents.  No recent exposure to any new animals.  Her husband has most concern that this is an allergy to coffee.  He said this happened many years ago.  She has been drinking coffee for the past 3 to 4 months but has not had any issues until yesterday.   PERTINENT  PMH / PSH: Hyperlipidemia, T2DM, history of allergic reaction  OBJECTIVE:   Pulse 73   Ht 5\' 3"  (1.6 m)   Wt 149 lb (67.6 kg)   SpO2 98%   BMI 26.39 kg/m   General: Pleasant and well-appearing, elderly HEENT: Face is without any lesions.  Patent airway.  No oropharyngeal lesions.  Mucous membranes. Cardiac: Regular rate and rhythm Respiratory: Normal work of breathing on room air.  No wheezing heard in lung fields.  Good air movement throughout.  No upper airway sounds noted. Skin: Diffuse urticarial rash over entire surface of the arms, legs, trunk and back with wheals.   ASSESSMENT/PLAN:    Assessment & Plan Urticaria Diffuse urticarial rash.  Presumed secondary to unknown allergen.  Unlikely to be secondary to coffee given that she has been drinking this for months without any issue.  Cognitive impairment does provide extra challenge with obtaining history about exposures to new foods or lotions/detergents. Reassuringly, no angioedema, anaphylactic reaction or concern for  airway protection. Patient was also examined by Dr. Deirdre Priest who agrees with plan as below: -Will continue to treat symptomatically with hydroxyzine as needed for itching, and Zyrtec 10 mg daily. -If symptoms recur, may consider referral to allergy and asthma for testing. -ED return precautions extensively discussed.    Darral Dash, DO [facility]

## 2023-09-03 NOTE — Patient Instructions (Signed)
It was great seeing you today.  As we discussed, - Start taking Zyrtec daily. - Take Hydroxyzine at night for the next few nights for itching  If your rash is not better in 2-3 days, please let us know.  Go to the ER for shortness of breath, tightness in the airway, or swelling in the eyes or face.  If you have any questions or concerns, please feel free to call the clinic.   Have a wonderful day,  Dr. Darral Dash [facility] [phone]

## 2023-09-08 ENCOUNTER — Other Ambulatory Visit: Payer: Self-pay | Admitting: Student

## 2023-09-12 ENCOUNTER — Telehealth: Payer: Self-pay

## 2023-09-12 ENCOUNTER — Other Ambulatory Visit (HOSPITAL_COMMUNITY): Payer: Self-pay

## 2023-09-12 NOTE — Telephone Encounter (Signed)
Pharmacy Patient Advocate Encounter   Received notification from CoverMyMeds that prior authorization for HYDROXYZINE TABS is required/requested.  PA required; PA submitted to [facility] via CoverMyMeds Key/confirmation #/EOC [facility] Status is pending

## 2023-09-16 ENCOUNTER — Other Ambulatory Visit (HOSPITAL_COMMUNITY): Payer: Self-pay

## 2023-09-16 NOTE — Telephone Encounter (Signed)
Pharmacy Patient Advocate Encounter  Received notification from [employer] that Prior Authorization for HYDROXYZINE TABLETS has been DENIED.  Full denial letter will be uploaded to the media tab. See denial reason below.    Patient may be able to pay cash price  PA #/Case ID/Reference #: [id number]

## 2023-09-19 ENCOUNTER — Other Ambulatory Visit: Payer: Self-pay | Admitting: Student

## 2023-09-19 DIAGNOSIS — E785 Hyperlipidemia, unspecified: Secondary | ICD-10-CM

## 2023-09-19 DIAGNOSIS — E039 Hypothyroidism, unspecified: Secondary | ICD-10-CM

## 2023-09-30 ENCOUNTER — Other Ambulatory Visit: Payer: Self-pay | Admitting: Student

## 2023-10-07 ENCOUNTER — Encounter: Payer: Self-pay | Admitting: Internal Medicine

## 2023-10-28 ENCOUNTER — Other Ambulatory Visit: Payer: Self-pay | Admitting: Student

## 2023-10-28 DIAGNOSIS — L301 Dyshidrosis [pompholyx]: Secondary | ICD-10-CM

## 2023-11-01 ENCOUNTER — Ambulatory Visit: Payer: Medicare Other

## 2023-11-01 ENCOUNTER — Ambulatory Visit (INDEPENDENT_AMBULATORY_CARE_PROVIDER_SITE_OTHER): Payer: Medicare Other | Admitting: Family Medicine

## 2023-11-01 ENCOUNTER — Encounter: Payer: Self-pay | Admitting: Family Medicine

## 2023-11-01 VITALS — BP 195/105 | HR 73 | Ht 63.0 in | Wt 149.2 lb

## 2023-11-01 DIAGNOSIS — I152 Hypertension secondary to endocrine disorders: Secondary | ICD-10-CM | POA: Diagnosis not present

## 2023-11-01 DIAGNOSIS — E119 Type 2 diabetes mellitus without complications: Secondary | ICD-10-CM

## 2023-11-01 DIAGNOSIS — L609 Nail disorder, unspecified: Secondary | ICD-10-CM | POA: Insufficient documentation

## 2023-11-01 DIAGNOSIS — E039 Hypothyroidism, unspecified: Secondary | ICD-10-CM

## 2023-11-01 DIAGNOSIS — R21 Rash and other nonspecific skin eruption: Secondary | ICD-10-CM

## 2023-11-01 DIAGNOSIS — E1159 Type 2 diabetes mellitus with other circulatory complications: Secondary | ICD-10-CM

## 2023-11-01 DIAGNOSIS — Z7985 Long-term (current) use of injectable non-insulin antidiabetic drugs: Secondary | ICD-10-CM

## 2023-11-01 LAB — POCT GLYCOSYLATED HEMOGLOBIN (HGB A1C): HbA1c, POC (controlled diabetic range): 6.5 % (ref 0.0–7.0)

## 2023-11-01 MED ORDER — PREDNISONE 20 MG PO TABS: 40.0000 mg | ORAL_TABLET | Freq: Every day | ORAL | 0 refills | Status: AC

## 2023-11-01 MED ORDER — CETIRIZINE HCL 10 MG PO TABS: 10.0000 mg | ORAL_TABLET | Freq: Every day | ORAL | 0 refills | Status: DC

## 2023-11-01 MED ORDER — BLOOD GLUCOSE MONITOR KIT: PACK | 0 refills | Status: AC

## 2023-11-01 MED ORDER — HALOBETASOL PROPIONATE 0.05 % EX CREA: TOPICAL_CREAM | Freq: Two times a day (BID) | CUTANEOUS | 0 refills | Status: DC

## 2023-11-01 NOTE — Assessment & Plan Note (Signed)
TSH checked Unclear if she is taking her meds since she can't tell the name of her meds Plan to bring in meds to PCP next Monday If TSH is elevated with confirmed compliance, will need to re-dose her meds She and her husband agreed with the plan

## 2023-11-01 NOTE — Patient Instructions (Signed)
It was nice seeing you today. I am sorry about your rash. I have sent in a new steroid medication one to take by mouth for 5 days and the other topical. Stop your triamcinolone. Take hydroxyzine as needed for itching. Note that your oral steroid will make your glucose go up a bit. Please monitor your glucose closely at home.  Your blood pressure is high. Ensure you are compliant with your BP medication. Please see PCP in 2-3 days as planned.

## 2023-11-01 NOTE — Progress Notes (Addendum)
SUBJECTIVE:   CHIEF COMPLAINT / HPI:   Rash This is a new problem. Episode onset: Started 1 week ago after eating peanut oil fried [place] on Thanksgiving day. The problem has been gradually worsening since onset. The rash is diffuse (Rash on face, scal, hands and chest). The rash is characterized by burning, redness and itchiness (Hand swelling. No SOB, no tongue or mouth or lip swelling). Pertinent negatives include no anorexia, congestion, diarrhea, eye pain, facial edema, fatigue, fever, shortness of breath or vomiting. Treatments tried: Hydroxyzine as needed for itching and triancinolone for rash with no improvement. The treatment provided no relief. Her past medical history is significant for allergies.  She had seen an allergist who completed allergy testing and advised her that she has peanut oil allergy.  HTN/DM2/Hypothyroidism: She stated she is compliant with all medications. She can't tell me the names of her meds or how she takes them. Her husband does not name her meds either. He stated she took her BP meds a few minutes before arriving. She denies any cardiopulmonary or neurologic symptoms. She feels well otherwise.   PERTINENT  PMH / PSH: PMHx reviewed  OBJECTIVE:   BP (!) 195/105   Pulse 73   Ht 5\' 3"  (1.6 m)   Wt 149 lb 3.2 oz (67.7 kg)   SpO2 98%   BMI 26.43 kg/m   Physical Exam Vitals and nursing note reviewed.  Constitutional:      General: She is not in acute distress.    Appearance: She is not toxic-appearing.  HENT:     Head: Normocephalic.     Comments: No lip swelling Eyes:     Conjunctiva/sclera: Conjunctivae normal.  Cardiovascular:     Rate and Rhythm: Normal rate and regular rhythm.     Heart sounds: Normal heart sounds. No murmur heard. Pulmonary:     Effort: Pulmonary effort is normal. No respiratory distress.     Breath sounds: Normal breath sounds. No wheezing or rhonchi.  Skin:    Comments: Erythematous, maculopapular patch on the dorsum  of her hands with scattered few pustular lesions on her fingers and the dorsum of her hands.  She has a similar but less intense rash on her face and chest with no pustular lesions.  Hyperkeratotic finger nails.          ASSESSMENT/PLAN:   Severe rash Differential include dyshidrotic eczema vs Eczema vs Food allergy rash Given rash severity, I started her on oral steroid x 5 days She is aware this can make her glucose go up Glucometer kit provided to them in print to pick up from the pharmacy for home CBG monitoring with CBG parameters HIV and RPR testing offered, but she and her husband declined test Continue Hydroxyzine prn itching D/C Triamcinolone Halobetasol escribed They declined allergy referral F/U soon if no improvement They agreed with the plan  Hypothyroidism TSH checked Unclear if she is taking her meds since she can't tell the name of her meds Plan to bring in meds to PCP next Monday If TSH is elevated with confirmed compliance, will need to re-dose her meds She and her husband agreed with the plan  Diabetes mellitus type 2, controlled (HCC) A1C checked today remains same at 6.5 Continue current dose of Metformin   Hypertension associated with diabetes (HCC) BP uncontrolled ?? Medication non-adherence vs ineffective agent I am unable to tell which is the issue as she and her husband does not know names of medications she is  on or how she takes them Repeat BP improved She is totally asymptomatic F/U appointment made with her PCP in 3 days for BP management I strongly recommended they came in with all her home medication for that visit She and her husband agreed with the plan  Fingernail abnormalities Unclear if due to onychomycosis vs chronic allergic reaction rash Consider reassessment and nail clipping for biopsy vs hand specialist referral I will defer to PCP to reassess once she improves from her rash treatment      Janit Pagan, MD [facility]  Health [facility]

## 2023-11-01 NOTE — Assessment & Plan Note (Signed)
A1C checked today remains same at 6.5 Continue current dose of Metformin

## 2023-11-01 NOTE — Assessment & Plan Note (Addendum)
Differential include dyshidrotic eczema vs Eczema vs Food allergy rash Given rash severity, I started her on oral steroid x 5 days She is aware this can make her glucose go up Glucometer kit provided to them in print to pick up from the pharmacy for home CBG monitoring with CBG parameters HIV and RPR testing offered, but she and her husband declined test Continue Hydroxyzine prn itching D/C Triamcinolone Halobetasol escribed They declined allergy referral F/U soon if no improvement They agreed with the plan

## 2023-11-01 NOTE — Assessment & Plan Note (Signed)
Unclear if due to onychomycosis vs chronic allergic reaction rash Consider reassessment and nail clipping for biopsy vs hand specialist referral I will defer to PCP to reassess once she improves from her rash treatment

## 2023-11-01 NOTE — Assessment & Plan Note (Signed)
BP uncontrolled ?? Medication non-adherence vs ineffective agent I am unable to tell which is the issue as she and her husband does not know names of medications she is on or how she takes them Repeat BP improved She is totally asymptomatic F/U appointment made with her PCP in 3 days for BP management I strongly recommended they came in with all her home medication for that visit She and her husband agreed with the plan

## 2023-11-02 ENCOUNTER — Telehealth: Payer: Self-pay | Admitting: Family Medicine

## 2023-11-02 LAB — TSH: TSH: 7 u[IU]/mL — ABNORMAL HIGH (ref 0.450–4.500)

## 2023-11-02 NOTE — Telephone Encounter (Signed)
  I called to discuss the test results. The husband picked up the call.  TSH elevation discussed. Per the husband, she is taking all meds as prescribed by PCP.  Plan to clarify medication with PCP. He is aware of the PCP appointment scheduled for 12/9 at 3:50 pm. They will come in with all medication bottles to confirm what she takes and what she does not take.  Plan: If she is compliant with Synthroid 75 mcg QD, increase Synthroid to 88 mcg QD and repeat TSH in 4-8 weeks If she is non-compliant with her Synthroid 75 mcg QD, the plan is to continue the same dose and repeat TSH in 4-8 weeks The husband verbalized understanding and agreed with the plan.

## 2023-11-04 ENCOUNTER — Encounter: Payer: Self-pay | Admitting: Student

## 2023-11-04 ENCOUNTER — Ambulatory Visit (INDEPENDENT_AMBULATORY_CARE_PROVIDER_SITE_OTHER): Payer: Self-pay | Admitting: Student

## 2023-11-04 VITALS — BP 171/80 | HR 79 | Temp 98.0°F | Ht 63.5 in | Wt 146.8 lb

## 2023-11-04 DIAGNOSIS — I152 Hypertension secondary to endocrine disorders: Secondary | ICD-10-CM

## 2023-11-04 DIAGNOSIS — E038 Other specified hypothyroidism: Secondary | ICD-10-CM | POA: Diagnosis not present

## 2023-11-04 DIAGNOSIS — E1159 Type 2 diabetes mellitus with other circulatory complications: Secondary | ICD-10-CM

## 2023-11-04 MED ORDER — AMLODIPINE BESYLATE-VALSARTAN 5-320 MG PO TABS: 1.0000 | ORAL_TABLET | Freq: Every day | ORAL | 2 refills | Status: DC

## 2023-11-04 NOTE — Assessment & Plan Note (Signed)
Poorly controlled BP.  Pressure today elevated and consistent with home blood pressure readings.  Although patient is currently asymptomatic will need better blood pressure control.  Via shared decision with patient she expressed preference for combo pill to reduce pill burden.  Will start patient on amlodipine-valsartan combo pill. -Rx Exforge 5-320 mg daily -Encourage patient to keep daily blood pressure logs -Follow-up in 2 weeks to reassess BP

## 2023-11-04 NOTE — Progress Notes (Signed)
    SUBJECTIVE:   CHIEF COMPLAINT / HPI: Hypertension 72 year old female history of cognitive impairment and hypertension Presenting today for blood pressure follow-up Blood pressure medication includes losartan 100 mg daily Admittedly checked home blood pressures usually around 160-170 systolic Denies any vision changes, chest pain or shortness of breath  Hypothyroidism History of hypothyroidism on levothyroxine 75 mg daily TSH was 7.0 and shows hypothyroidism Today patient endorses taking her medication daily However she mostly taking it with meals and how other medications. No concerns for weight gain, cold intolerance or constipation   PERTINENT  PMH / PSH: Reviewed   OBJECTIVE:   BP (!) 171/80   Pulse 79   Temp 98 F (36.7 C)   Ht 5' 3.5" (1.613 m)   Wt 146 lb 12.8 oz (66.6 kg)   SpO2 94%   BMI 25.60 kg/m    Physical Exam General: Alert, well appearing, NAD Cardiovascular: RRR, No Murmurs, Normal S2/S2 Respiratory: CTAB, No wheezing or Rales Abdomen: No distension or tenderness Extremities: No edema on extremities   Skin: Warm and dry  ASSESSMENT/PLAN:   Hypertension associated with diabetes (HCC) Poorly controlled BP.  Pressure today elevated and consistent with home blood pressure readings.  Although patient is currently asymptomatic will need better blood pressure control.  Via shared decision with patient she expressed preference for combo pill to reduce pill burden.  Will start patient on amlodipine-valsartan combo pill. -Rx Exforge 5-320 mg daily -Encourage patient to keep daily blood pressure logs -Follow-up in 2 weeks to reassess BP  Hypothyroidism Recent thyroid study from a week ago showed hypothyroidism with TSH of 7.0.  In discussion with patient she does take her medication however inappropriately as she is taking them with meals and oral medication.  Suspect her hypothyroidism and poor response to medication could be attributed to poor absorption  from how she takes her med.  Via shared decision patient expressed preference of not increasing her thyroid dose at this time making changes to take her medications fasting in the morning without meals.  Patient will follow-up in 6 weeks to recheck thyroid lab and if no changes at that time will increase her Synthroid to 88 mcg.     Jerre Simon, MD [facility]

## 2023-11-04 NOTE — Patient Instructions (Addendum)
Good to see you today.  Your blood pressure today was elevated.  I have started you on a combination pill that contains 2 blood pressure medication.  Please take them daily   Also check your blood pressure daily so that we can see what your readings are and follow-up in 2 weeks to recheck your blood pressure.   Your thyroid exam showed that you still have low thyroid levels.  I suspect this is most likely because you were not taking the medication on empty stomach.  As discussed with you please make sure to take the medication first thing in the morning before taking anything by mouth.  You need to follow-up in 6 weeks lets recheck your thyroid level.

## 2023-11-04 NOTE — Assessment & Plan Note (Signed)
Recent thyroid study from a week ago showed hypothyroidism with TSH of 7.0.  In discussion with patient she does take her medication however inappropriately as she is taking them with meals and oral medication.  Suspect her hypothyroidism and poor response to medication could be attributed to poor absorption from how she takes her med.  Via shared decision patient expressed preference of not increasing her thyroid dose at this time making changes to take her medications fasting in the morning without meals.  Patient will follow-up in 6 weeks to recheck thyroid lab and if no changes at that time will increase her Synthroid to 88 mcg.

## 2023-11-10 ENCOUNTER — Other Ambulatory Visit: Payer: Self-pay | Admitting: Student

## 2023-11-10 DIAGNOSIS — E119 Type 2 diabetes mellitus without complications: Secondary | ICD-10-CM

## 2023-11-12 ENCOUNTER — Other Ambulatory Visit: Payer: Self-pay | Admitting: Family Medicine

## 2023-11-26 ENCOUNTER — Other Ambulatory Visit: Payer: Self-pay | Admitting: Student

## 2023-11-28 ENCOUNTER — Encounter (HOSPITAL_COMMUNITY): Payer: Self-pay

## 2023-11-28 ENCOUNTER — Ambulatory Visit: Payer: Medicare Other

## 2023-11-28 ENCOUNTER — Ambulatory Visit (HOSPITAL_COMMUNITY)
Admission: EM | Admit: 2023-11-28 | Discharge: 2023-11-28 | Disposition: A | Discharge status: Home / Self Care | Payer: Medicare Other | Attending: Emergency Medicine | Admitting: Emergency Medicine

## 2023-11-28 DIAGNOSIS — L089 Local infection of the skin and subcutaneous tissue, unspecified: Secondary | ICD-10-CM

## 2023-11-28 DIAGNOSIS — L259 Unspecified contact dermatitis, unspecified cause: Secondary | ICD-10-CM

## 2023-11-28 DIAGNOSIS — L209 Atopic dermatitis, unspecified: Secondary | ICD-10-CM | POA: Diagnosis not present

## 2023-11-28 DIAGNOSIS — B9689 Other specified bacterial agents as the cause of diseases classified elsewhere: Secondary | ICD-10-CM | POA: Diagnosis not present

## 2023-11-28 LAB — POCT FASTING CBG KUC MANUAL ENTRY: POCT Glucose (KUC): 130 mg/dL — AB (ref 70–99)

## 2023-11-28 MED ORDER — CEPHALEXIN 500 MG PO CAPS: 500.0000 mg | ORAL_CAPSULE | Freq: Two times a day (BID) | ORAL | 0 refills | Status: AC

## 2023-11-28 MED ORDER — METHYLPREDNISOLONE SODIUM SUCC 125 MG IJ SOLR
INTRAMUSCULAR | Status: AC
  Filled 2023-11-28: qty 2

## 2023-11-28 MED ORDER — METHYLPREDNISOLONE SODIUM SUCC 125 MG IJ SOLR
60.0000 mg | Freq: Once | INTRAMUSCULAR | Status: AC
  Administered 2023-11-28: 60 mg via INTRAMUSCULAR

## 2023-11-28 NOTE — ED Triage Notes (Signed)
 Pt c/o itchy rash all over x3 days. States allergic to chocolate, coffee, and peanuts. States this started after eating a deep fried [place] in peanut oil. Last benadryl was at 6:30 this morning.

## 2023-11-28 NOTE — ED Provider Notes (Signed)
 [facility]    CSN: [id number] Arrival date & time: 11/28/23  0802      History   Chief Complaint Chief Complaint  Patient presents with   Rash    HPI [name] is a 73 y.o. female.  With spouse who helps provide history  3 days of itching and burning rash on face and neck No known exposure to allergen. Denies new foods, medications, products. No skin trauma known. Took benadryl  2 hours prior to arrival Not having oral swelling, shortness of breath, abd pain/NV  Hx DM. A1c last month was 6.5   Reports peanut allergy that causes body rash. She originally mentioned eating something cooked in peanut oil - however husband clarifies that was actually 2 months ago  Past Medical History:  Diagnosis Date   Allergy    Anxiety    Arthritis    Cerebral atrophy (HCC) 05/17/2021   Cough due to ACE inhibitor 06/16/2020   CVA (cerebral vascular accident) (HCC) 04/08/2021   Diabetes mellitus type 2, controlled (HCC) 04/21/2015   Headache 04/11/2021   Hearing loss 05/18/2021   History of panic attacks 05/19/2021   Hx of adenomatous colonic polyps 01/26/2015   and also 2021 - recall 2024   Hyperlipidemia    Hypertension    Memory loss of unknown cause 03/04/2002   suspect depression with psychotic features. patient hospitalized and work-up negative.    Panic attack    5-6 a day per pt and husband    Pompholyx eczema 05/30/2018   Seizure (HCC) 02/23/2010   Qualifier: Diagnosis of  By: Jeanelle MD, Marshall     Seizures [facility])    per husband last seizure 3 yeras ago- documented 06-29-2020   Thyroid  disease    thyroid  surgery when young   TIA (transient ischemic attack) 04/08/2021   Urge incontinence 05/18/2021    Patient Active Problem List   Diagnosis Date Noted   Fingernail abnormalities 11/01/2023   Severe rash 10/25/2022   Unsteady gait when walking 05/19/2021   History of panic attacks 05/19/2021   Urge incontinence 05/18/2021   Hearing loss 05/18/2021    Visual impairment 05/18/2021   Complex partial seizure evolving to generalized seizure (HCC)    Allergic rhinitis 05/17/2021   Cerebral atrophy (HCC) 05/17/2021   Chronic mixed headache syndrome 03/01/2021   Diabetes mellitus type 2, controlled (HCC) 04/21/2015   Dizziness 01/28/2014   Disturbance in sleep behavior 01/05/2010   Major depression, recurrent, chronic (HCC) 09/13/2009   Anxiety state 09/03/2007   Hypothyroidism 01/23/2007   Hyperlipidemia associated with type 2 diabetes mellitus (HCC) 01/23/2007   Cognitive impairment 01/23/2007   Hypertension associated with diabetes (HCC) 01/23/2007   Static encephalopathy 11/26/2001    Past Surgical History:  Procedure Laterality Date   BUNIONECTOMY Bilateral    COLONOSCOPY     THYROIDECTOMY, PARTIAL     TONSILLECTOMY      OB History   No obstetric history on file.      Home Medications    Prior to Admission medications   Medication Sig Start Date End Date Taking? Authorizing Provider  cephALEXin  (KEFLEX ) 500 MG capsule Take 1 capsule (500 mg total) by mouth 2 (two) times daily for 7 days. 11/28/23 12/05/23 Yes Jesselee Poth, Asberry, PA-C  amLODipine -valsartan  (EXFORGE ) 5-320 MG tablet Take 1 tablet by mouth daily. 11/04/23 02/02/24  Rosendo Rush, MD  atorvastatin  (LIPITOR) 80 MG tablet Take 1 tablet by mouth once daily 09/19/23   Rosendo Rush, MD  blood glucose  meter kit and supplies KIT Dispense based on patient and insurance preference. Use to check blood glucose once daily. 11/01/23   Anders Otto DASEN, MD  cetirizine  (ZYRTEC ) 10 MG tablet Take 1 tablet (10 mg total) by mouth daily. 11/01/23   Anders Otto DASEN, MD  citalopram  (CELEXA ) 20 MG tablet Take 1 tablet by mouth once daily 02/27/22   Rosendo Rush, MD  clonazePAM  (KLONOPIN ) 0.5 MG tablet Take 1 tablet (0.5 mg total) by mouth 2 (two) times daily as needed. for anxiety 07/13/22   Rosendo Rush, MD  clopidogrel  (PLAVIX ) 75 MG tablet Take 1 tablet by mouth once daily 09/09/23    Rosendo Rush, MD  halobetasol  (ULTRAVATE ) 0.05 % cream APPLY  CREAM TOPICALLY TWICE DAILY 11/12/23   Rosendo Rush, MD  hydrOXYzine  (ATARAX ) 10 MG tablet TAKE 1 TABLET BY MOUTH AT BEDTIME AS NEEDED FOR ITCHING 10/28/23   Rosendo Rush, MD  lacosamide (VIMPAT) 50 MG TABS tablet Take 50 mg by mouth 2 (two) times daily. 06/22/23 06/21/24  [provider]  levETIRAcetam  (KEPPRA ) 500 MG tablet Take 3 tablets (1,500 mg total) by mouth 2 (two) times daily. 07/26/17 05/22/21  Gonfa, Taye T, MD  levothyroxine  (SYNTHROID ) 75 MCG tablet TAKE 1 TABLET BY MOUTH ONCE DAILY BEFORE BREAKFAST 09/19/23   Rosendo Rush, MD  metFORMIN  (GLUCOPHAGE ) 500 MG tablet TAKE 1 TABLET BY MOUTH TWICE DAILY WITH A MEAL 11/11/23   Rosendo Rush, MD  QUEtiapine  (SEROQUEL ) 50 MG tablet TAKE 1/2 (ONE-HALF) TABLET BY MOUTH AT BEDTIME 08/22/23   Rosendo Rush, MD  SUMAtriptan  (IMITREX ) 25 MG tablet Take 1 tablet (25 mg total) by mouth every 2 (two) hours as needed for migraine. May repeat in 2 hours if headache persists or recurs. 05/19/21   McDiarmid, Krystal BIRCH, MD    Family History Family History  Problem Relation Age of Onset   Coronary artery disease Sister    Cancer - Prostate Paternal Grandfather    Colon cancer Neg Hx    Rectal cancer Neg Hx    Stomach cancer Neg Hx    Colon polyps Neg Hx    Esophageal cancer Neg Hx     Social History Social History   Tobacco Use   Smoking status: Never   Smokeless tobacco: Never  Vaping Use   Vaping status: Never Used  Substance Use Topics   Alcohol use: No    Alcohol/week: 0.0 standard drinks of alcohol   Drug use: No     Allergies   Chocolate, Coffee bean extract [coffea arabica], Peanut-containing drug products, and Topiramate    Review of Systems Review of Systems  Skin:  Positive for rash.   Per HPI  Physical Exam Triage Vital Signs ED Triage Vitals [11/28/23 0820]  Encounter Vitals Group     BP (!) 167/91     Systolic BP Percentile      Diastolic BP  Percentile      Pulse Rate 70     Resp 18     Temp 98 F (36.7 C)     Temp Source Oral     SpO2 97 %     Weight      Height      Head Circumference      Peak Flow      Pain Score 0     Pain Loc      Pain Education      Exclude from Growth Chart    No data found.  Updated Vital Signs BP (!) 167/91 (  BP Location: Right Arm)   Pulse 70   Temp 98 F (36.7 C) (Oral)   Resp 18   SpO2 97%    Physical Exam Vitals and nursing note reviewed.  Constitutional:      General: She is not in acute distress.    Appearance: Normal appearance.  HENT:     Mouth/Throat:     Mouth: Mucous membranes are moist.     Pharynx: Oropharynx is clear. No posterior oropharyngeal erythema.  Eyes:     Conjunctiva/sclera: Conjunctivae normal.     Pupils: Pupils are equal, round, and reactive to light.  Cardiovascular:     Rate and Rhythm: Normal rate and regular rhythm.     Pulses: Normal pulses.     Heart sounds: Normal heart sounds.  Pulmonary:     Effort: Pulmonary effort is normal. No respiratory distress.     Breath sounds: Normal breath sounds. No wheezing.  Abdominal:     General: Bowel sounds are normal.     Tenderness: There is no abdominal tenderness.  Musculoskeletal:        General: Normal range of motion.     Cervical back: Normal range of motion.  Skin:    Findings: Rash present.     Comments: Forehead, nasal folds, chin, neck and chest have rash. Erythematous especially the chest. Hot to touch. There is some excoriated skin on the chin. A patch of dry/scaly skin on the chest. No rash on arms, hands, back.  Neurological:     Mental Status: She is alert and oriented to person, place, and time.     UC Treatments / Results  Labs (all labs ordered are listed, but only abnormal results are displayed) Labs Reviewed  POCT FASTING CBG [facility] MANUAL ENTRY - Abnormal; Notable for the following components:      Result Value   POCT Glucose ([facility]) 130 (*)    All other components within  normal limits    EKG  Radiology No results found.  Procedures Procedures   Medications Ordered in UC Medications  methylPREDNISolone  sodium succinate (SOLU-MEDROL ) 125 mg/2 mL injection 60 mg (60 mg Intramuscular Given 11/28/23 0856)    Initial Impression / Assessment and Plan / UC Course  I have reviewed the triage vital signs and the nursing notes.  Pertinent labs & imaging results that were available during my care of the patient were reviewed by me and considered in my medical decision making (see chart for details).  Rash does not seem allergic Suspect a contact or atopic dermatitis with likely secondary bacterial skin infection. CBG 130 IM steroid given in clinic for itch. Advised home glucose monitoring Keflex  BID x 7, cover for infection  Advised return and ED precautions Patient and spouse agree to plan, no questions at this time   Final Clinical Impressions(s) / UC Diagnoses   Final diagnoses:  Contact dermatitis, unspecified contact dermatitis type, unspecified trigger  Bacterial skin infection  Atopic dermatitis, unspecified type     Discharge Instructions      Please monitor your blood sugar over the next several days, as the steroid injection will likely raise it.  Apply topical lotion and cool compress to the skin  Please take the antibiotic as prescribed. This is to cover for a possible bacterial infection of the skin. Please take the whole course. Take with food to avoid upset stomach.  Please go to the emergency department if symptoms worsen.     ED Prescriptions     Medication  Sig Dispense Auth. Provider   cephALEXin  (KEFLEX ) 500 MG capsule Take 1 capsule (500 mg total) by mouth 2 (two) times daily for 7 days. 14 capsule Cheskel Silverio, Asberry, PA-C      PDMP not reviewed this encounter.   Jeryl Asberry RIGGERS 11/28/23 [id number]

## 2023-11-28 NOTE — Discharge Instructions (Addendum)
 Please monitor your blood sugar over the next several days, as the steroid injection will likely raise it.  Apply topical lotion and cool compress to the skin  Please take the antibiotic as prescribed. This is to cover for a possible bacterial infection of the skin. Please take the whole course. Take with food to avoid upset stomach.  Please go to the emergency department if symptoms worsen.

## 2023-12-18 ENCOUNTER — Other Ambulatory Visit: Payer: Self-pay | Admitting: Student

## 2023-12-30 ENCOUNTER — Other Ambulatory Visit: Payer: Self-pay | Admitting: Family Medicine

## 2023-12-30 ENCOUNTER — Other Ambulatory Visit: Payer: Self-pay | Admitting: Student

## 2023-12-30 DIAGNOSIS — E785 Hyperlipidemia, unspecified: Secondary | ICD-10-CM

## 2023-12-30 DIAGNOSIS — E039 Hypothyroidism, unspecified: Secondary | ICD-10-CM

## 2024-01-10 ENCOUNTER — Other Ambulatory Visit: Payer: Self-pay | Admitting: Student

## 2024-01-27 ENCOUNTER — Other Ambulatory Visit: Payer: Self-pay | Admitting: Student

## 2024-01-27 DIAGNOSIS — E1159 Type 2 diabetes mellitus with other circulatory complications: Secondary | ICD-10-CM

## 2024-02-04 ENCOUNTER — Other Ambulatory Visit: Payer: Self-pay | Admitting: Student

## 2024-02-04 DIAGNOSIS — E039 Hypothyroidism, unspecified: Secondary | ICD-10-CM

## 2024-02-12 ENCOUNTER — Other Ambulatory Visit: Payer: Self-pay | Admitting: Student

## 2024-02-12 DIAGNOSIS — E119 Type 2 diabetes mellitus without complications: Secondary | ICD-10-CM

## 2024-02-17 ENCOUNTER — Other Ambulatory Visit: Payer: Self-pay | Admitting: Student

## 2024-02-22 ENCOUNTER — Other Ambulatory Visit: Payer: Self-pay | Admitting: Student

## 2024-02-22 DIAGNOSIS — G479 Sleep disorder, unspecified: Secondary | ICD-10-CM

## 2024-02-27 ENCOUNTER — Other Ambulatory Visit: Payer: Self-pay | Admitting: Student

## 2024-02-27 DIAGNOSIS — I152 Hypertension secondary to endocrine disorders: Secondary | ICD-10-CM

## 2024-03-17 LAB — HM DIABETES EYE EXAM

## 2024-03-24 ENCOUNTER — Other Ambulatory Visit: Payer: Self-pay | Admitting: Student

## 2024-03-29 ENCOUNTER — Other Ambulatory Visit: Payer: Self-pay | Admitting: Student

## 2024-04-04 ENCOUNTER — Other Ambulatory Visit: Payer: Self-pay | Admitting: Student

## 2024-04-04 DIAGNOSIS — E1159 Type 2 diabetes mellitus with other circulatory complications: Secondary | ICD-10-CM

## 2024-04-04 DIAGNOSIS — E785 Hyperlipidemia, unspecified: Secondary | ICD-10-CM

## 2024-04-13 ENCOUNTER — Ambulatory Visit: Payer: Medicare Other

## 2024-04-13 VITALS — Ht 63.5 in | Wt 148.0 lb

## 2024-04-13 DIAGNOSIS — Z0000 Encounter for general adult medical examination without abnormal findings: Secondary | ICD-10-CM | POA: Diagnosis not present

## 2024-04-13 NOTE — Progress Notes (Signed)
 Because this visit was a virtual/telehealth visit,  certain criteria was not obtained, such a blood pressure, CBG if applicable, and timed get up and go. Any medications not marked as "taking" were not mentioned during the medication reconciliation part of the visit. Any vitals not documented were not able to be obtained due to this being a telehealth visit or patient was unable to self-report a recent blood pressure reading due to a lack of equipment at home via telehealth. Vitals that have been documented are verbally provided by the patient.   Subjective:   [name] is a 73 y.o. who presents for a Medicare Wellness preventive visit.  As a reminder, Annual Wellness Visits don't include a physical exam, and some assessments may be limited, especially if this visit is performed virtually. We may recommend an in-person follow-up visit with your provider if needed.  Visit Complete: Virtual I connected with  [name] on 04/13/24 by a audio enabled telemedicine application and verified that I am speaking with the correct person using two identifiers.  Patient Location: Home  Provider Location: Office/Clinic  I discussed the limitations of evaluation and management by telemedicine. The patient expressed understanding and agreed to proceed.  Vital Signs: Because this visit was a virtual/telehealth visit, some criteria may be missing or patient reported. Any vitals not documented were not able to be obtained and vitals that have been documented are patient reported.  VideoDeclined- This patient declined Librarian, academic. Therefore the visit was completed with audio only.  Persons Participating in Visit: Patient assisted by Husband.  AWV Questionnaire: No: Patient Medicare AWV questionnaire was not completed prior to this visit.  Cardiac Risk Factors include: advanced age (>63men, >47 women);dyslipidemia;hypertension;diabetes mellitus;sedentary  lifestyle     Objective:     Today's Vitals   04/13/24 0838  Weight: 148 lb (67.1 kg)  Height: 5' 3.5" (1.613 m)  PainSc: 0-No pain   Body mass index is 25.81 kg/m.     04/13/2024    8:41 AM 11/04/2023    3:55 PM 11/01/2023    9:55 AM 04/15/2023    1:43 PM 10/25/2022    2:22 PM 05/18/2021    1:44 PM 04/08/2021    8:16 PM  Advanced Directives  Does Patient Have a Medical Advance Directive? No No No No No No Yes  Type of Tax inspector;Living will  Does patient want to make changes to medical advance directive?       No - Patient declined  Copy of Healthcare Power of Attorney in Chart?       No - copy requested  Would patient like information on creating a medical advance directive? No - Patient declined  No - Patient declined No - Patient declined No - Patient declined No - Patient declined     Current Medications (verified) Outpatient Encounter Medications as of 04/13/2024  Medication Sig   amLODipine -valsartan  (EXFORGE ) 5-320 MG tablet Take 1 tablet by mouth once daily   atorvastatin  (LIPITOR) 80 MG tablet Take 1 tablet by mouth once daily   blood glucose meter kit and supplies KIT Dispense based on patient and insurance preference. Use to check blood glucose once daily.   cetirizine  (ZYRTEC ) 10 MG tablet Take 1 tablet by mouth once daily   citalopram  (CELEXA ) 20 MG tablet Take 1 tablet by mouth once daily   clonazePAM  (KLONOPIN ) 0.5 MG tablet Take 1 tablet (0.5 mg total)  by mouth 2 (two) times daily as needed. for anxiety   clopidogrel  (PLAVIX ) 75 MG tablet Take 1 tablet by mouth once daily   halobetasol  (ULTRAVATE ) 0.05 % cream APPLY CREAM TOPICALLY TWICE DAILY   hydrOXYzine  (ATARAX ) 10 MG tablet TAKE 1 TABLET BY MOUTH AT BEDTIME AS NEEDED FOR ITCHING   lacosamide (VIMPAT) 50 MG TABS tablet Take 50 mg by mouth 2 (two) times daily.   levETIRAcetam  (KEPPRA ) 500 MG tablet Take 3 tablets (1,500 mg total) by mouth 2 (two) times daily.    levothyroxine  (SYNTHROID ) 75 MCG tablet TAKE 1 TABLET BY MOUTH ONCE DAILY BEFORE BREAKFAST   metFORMIN  (GLUCOPHAGE ) 500 MG tablet TAKE 1 TABLET BY MOUTH TWICE DAILY WITH A MEAL   QUEtiapine  (SEROQUEL ) 50 MG tablet TAKE 1/2 (ONE-HALF) TABLET BY MOUTH AT BEDTIME   SUMAtriptan  (IMITREX ) 25 MG tablet Take 1 tablet (25 mg total) by mouth every 2 (two) hours as needed for migraine. May repeat in 2 hours if headache persists or recurs.   No facility-administered encounter medications on file as of 04/13/2024.    Allergies (verified) Chocolate, Coffee bean extract [coffea arabica], Peanut-containing drug products, and Topiramate    History: Past Medical History:  Diagnosis Date   Allergy    Anxiety    Arthritis    Cerebral atrophy (HCC) 05/17/2021   Cough due to ACE inhibitor 06/16/2020   CVA (cerebral vascular accident) (HCC) 04/08/2021   Diabetes mellitus type 2, controlled (HCC) 04/21/2015   Headache 04/11/2021   Hearing loss 05/18/2021   History of panic attacks 05/19/2021   Hx of adenomatous colonic polyps 01/26/2015   and also 2021 - recall 2024   Hyperlipidemia    Hypertension    Memory loss of unknown cause 03/04/2002   suspect depression with psychotic features. patient hospitalized and work-up negative.    Panic attack    5-6 a day per pt and husband    Pompholyx eczema 05/30/2018   Seizure (HCC) 02/23/2010   Qualifier: Diagnosis of  By: Lanetta Pion MD, [place]     Seizures [facility])    per husband last seizure 3 yeras ago- documented 06-29-2020   Thyroid  disease    thyroid  surgery when young   TIA (transient ischemic attack) 04/08/2021   Urge incontinence 05/18/2021   Past Surgical History:  Procedure Laterality Date   BUNIONECTOMY Bilateral    COLONOSCOPY     THYROIDECTOMY, PARTIAL     TONSILLECTOMY     Family History  Problem Relation Age of Onset   Coronary artery disease Sister    Cancer - Prostate Paternal Grandfather    Colon cancer Neg Hx    Rectal cancer Neg Hx     Stomach cancer Neg Hx    Colon polyps Neg Hx    Esophageal cancer Neg Hx    Social History   Socioeconomic History   Marital status: Married    Spouse name: [name]   Number of children: 4   Years of education: Not on file   Highest education level: Not on file  Occupational History   Not on file  Tobacco Use   Smoking status: Never   Smokeless tobacco: Never  Vaping Use   Vaping status: Never Used  Substance and Sexual Activity   Alcohol use: No    Alcohol/week: 0.0 standard drinks of alcohol   Drug use: No   Sexual activity: Not on file  Other Topics Concern   Not on file  Social History Narrative   Social Information  05/17/2021   Patient lives with husband  ,has 2 daughters . Patient enjoys watching bird, doing puzzles and watching TV .  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  Has 12 siblings and raised on a farm.   Primary family support persons is her husband of 27 years.    Transportation to appointments provided by husband or daughter;   ( she no longer drives, last drove about 2 years ago)   Strengths:Active sense of humor   Scientist, water quality   Motivation for treatment/growth   Supportive family/friends      Basic Activities of Daily Living    Dressing:Self-care   Eating: Self-care  ( husband does all of the cooking)   Ambulation: Self-care   Toileting: Self-care   Bathing: Self-care       Instrumental Activities of Daily Living   Shopping: Total assistance   House/Yard Work: Partial assistance   Administration of medications: Partial assistance with reminders   Finances: Total assistance   Telephone: Self-care   Transportation: Total assistance   Social Drivers of Health   Financial Resource Strain: Low Risk  (04/13/2024)   Overall Financial Resource Strain (CARDIA)    Difficulty of Paying Living Expenses: Not hard at all  Food Insecurity: No Food Insecurity (04/13/2024)   Hunger Vital Sign     Worried About Running Out of Food in the Last Year: Never true    Ran Out of Food in the Last Year: Never true  Transportation Needs: No Transportation Needs (04/13/2024)   PRAPARE - Administrator, Civil Service (Medical): No    Lack of Transportation (Non-Medical): No  Physical Activity: Insufficiently Active (04/13/2024)   Exercise Vital Sign    Days of Exercise per Week: 7 days    Minutes of Exercise per Session: 10 min  Stress: Stress Concern Present (04/13/2024)   [employer] of Occupational Health - Occupational Stress Questionnaire    Feeling of Stress : To some extent  Social Connections: Socially Integrated (04/13/2024)   Social Connection and Isolation Panel [NHANES]    Frequency of Communication with Friends and Family: More than three times a week    Frequency of Social Gatherings with Friends and Family: Once a week    Attends Religious Services: More than 4 times per year    Active Member of [employer] or Organizations: Yes    Attends Banker Meetings: 1 to 4 times per year    Marital Status: Married    Tobacco Counseling Counseling given: Not Answered    Clinical Intake:  Pre-visit preparation completed: No  Pain : No/denies pain Pain Score: 0-No pain     BMI - recorded: 25.81 Nutritional Status: BMI 25 -29 Overweight Diabetes: Yes CBG done?: No Did pt. bring in CBG monitor from home?: No  Lab Results  Component Value Date   HGBA1C 6.5 11/01/2023   HGBA1C 6.5 04/15/2023   HGBA1C 6.1 04/11/2021     How often do you need to have someone help you when you read instructions, pamphlets, or other written materials from your doctor or pharmacy?: 2 - Rarely  Interpreter Needed?: No  Information entered by :: Irvin Bastin N. Meko Bellanger, LPN.   Activities of Daily Living     04/13/2024    8:43 AM  In your present state of health, do you have any difficulty performing the following activities:  Hearing? 0  Vision? 0  Difficulty  concentrating or making decisions? 1  Walking or climbing stairs? 0  Dressing or bathing? 0  Doing errands, shopping? 0  Preparing Food and eating ? N  Using the Toilet? N  In the past six months, have you accidently leaked urine? Y  Do you have problems with loss of bowel control? N  Managing your Medications? Y  Managing your Finances? Y  Housekeeping or managing your Housekeeping? N    Patient Care Team: Goble Last, MD as PCP - General (Family Medicine) Suzi Essex, MD (Inactive) as Referring Physician (Psychiatry) Linard Reno, MD as Consulting Physician (Ophthalmology)  Indicate any recent Medical Services you may have received from other than [facility] providers in the past year (date may be approximate).     Assessment:    This is a routine wellness examination for [place].  Hearing/Vision screen Hearing Screening - Comments:: Denies hearing difficulties.  Vision Screening - Comments:: Cataracts done  - up to date with routine eye exams with Dr. Linard Reno.    Goals Addressed               This Visit's Progress     Patient Stated (pt-stated)        04/13/2024: To be able to drive again.       Depression Screen     04/13/2024    8:44 AM 11/04/2023    3:55 PM 11/01/2023    9:58 AM 09/03/2023    3:39 PM 04/15/2023    1:49 PM 04/04/2023    9:16 AM 05/18/2021    1:43 PM  PHQ 2/9 Scores  PHQ - 2 Score 0 6 6 4 4 1 6   PHQ- 9 Score 4 20 18 14 12  24     Fall Risk     04/13/2024    8:41 AM 11/01/2023    9:55 AM 04/04/2023    9:17 AM 05/18/2021    1:43 PM 12/21/2020   10:03 AM  Fall Risk   Falls in the past year? 0 0 0 1 0  Number falls in past yr: 0 0 0 0 0  Injury with Fall? 0 0 0 0 0  Risk for fall due to : No Fall Risks  No Fall Risks    Follow up Falls prevention discussed;Falls evaluation completed  Falls evaluation completed      MEDICARE RISK AT HOME:  Medicare Risk at Home Any stairs in or around the home?: No If so, are there any without  handrails?: No Home free of loose throw rugs in walkways, pet beds, electrical cords, etc?: Yes Adequate lighting in your home to reduce risk of falls?: Yes Life alert?: Yes (ALEXA) Use of a cane, walker or w/c?: No Grab bars in the bathroom?: Yes Shower chair or bench in shower?: Yes Elevated toilet seat or a handicapped toilet?: Yes  TIMED UP AND GO:  Was the test performed?  No  Cognitive Function: Impaired: Patient has current diagnosis of cognitive impairment.    04/13/2024    8:45 AM 08/22/2017    2:00 PM 03/30/2013    2:00 PM  MMSE - Mini Mental State Exam  Not completed: Unable to complete    Orientation to time  2 4  Orientation to time comments  oriented to year and season   Orientation to Place  5 5  Registration  3 3  Attention/ Calculation  5 4  Attention/Calculation-comments  wasn't able to count back but was able to spell world backward   Recall  0 2  Language- name  2 objects  2 2  Language- repeat  1 1  Language- follow 3 step command  3 3  Language- read & follow direction  1 1  Write a sentence  1 1  Copy design  1 1  Total score  24 27        04/04/2023    9:21 AM  6CIT Screen  What Year? 4 points  What month? 0 points  What time? 0 points  Count back from 20 0 points  Months in reverse 0 points  Repeat phrase 0 points  Total Score 4 points    Immunizations Immunization History  Administered Date(s) Administered   Fluad Quad(high Dose 65+) 09/21/2020   Influenza,inj,Quad PF,6+ Mos 10/05/2013, 10/20/2014, 01/25/2016, 09/14/2016, 07/24/2017, 12/18/2018   PFIZER Comirnaty(Gray Top)Covid-19 Tri-Sucrose Vaccine 12/21/2020   PFIZER(Purple Top)SARS-COV-2 Vaccination 01/22/2020, 02/17/2020   Pneumococcal Conjugate-13 07/24/2017   Pneumococcal Polysaccharide-23 10/05/2013, 12/19/2018   Td 08/31/2005    Screening Tests Health Maintenance  Topic Date Due   OPHTHALMOLOGY EXAM  Never done   MAMMOGRAM  Never done   Zoster Vaccines- Shingrix (1 of 2)  Never done   DTaP/Tdap/Td (2 - Tdap) 09/01/2015   DEXA SCAN  Never done   FOOT EXAM  06/19/2019   Colonoscopy  07/14/2023   COVID-19 Vaccine (4 - 2024-25 season) 07/28/2023   Diabetic kidney evaluation - eGFR measurement  04/14/2024   Diabetic kidney evaluation - Urine ACR  04/14/2024   HEMOGLOBIN A1C  05/01/2024   INFLUENZA VACCINE  06/26/2024   Medicare Annual Wellness (AWV)  04/13/2025   Pneumonia Vaccine 65+ Years old  Completed   Hepatitis C Screening  Completed   HPV VACCINES  Aged Out   Meningococcal B Vaccine  Aged Out    Health Maintenance  Health Maintenance Due  Topic Date Due   OPHTHALMOLOGY EXAM  Never done   MAMMOGRAM  Never done   Zoster Vaccines- Shingrix (1 of 2) Never done   DTaP/Tdap/Td (2 - Tdap) 09/01/2015   DEXA SCAN  Never done   FOOT EXAM  06/19/2019   Colonoscopy  07/14/2023   COVID-19 Vaccine (4 - 2024-25 season) 07/28/2023   Diabetic kidney evaluation - eGFR measurement  04/14/2024   Diabetic kidney evaluation - Urine ACR  04/14/2024   Health Maintenance Items Addressed: Yes Patient and spouse aware of current health care gaps.  Additional Screening:  Vision Screening: Recommended annual ophthalmology exams for early detection of glaucoma and other disorders of the eye.  Dental Screening: Recommended annual dental exams for proper oral hygiene  Community Resource Referral / Chronic Care Management: CRR required this visit?  No   CCM required this visit?  No   Plan:    I have personally reviewed and noted the following in the patient's chart:   Medical and social history Use of alcohol, tobacco or illicit drugs  Current medications and supplements including opioid prescriptions. Patient is not currently taking opioid prescriptions. Functional ability and status Nutritional status Physical activity Advanced directives List of other physicians Hospitalizations, surgeries, and ER visits in previous 12 months Vitals Screenings to  include cognitive, depression, and falls Referrals and appointments  In addition, I have reviewed and discussed with patient certain preventive protocols, quality metrics, and best practice recommendations. A written personalized care plan for preventive services as well as general preventive health recommendations were provided to patient.   Margette Sheldon, LPN   07/06/9146   After Visit Summary: (Declined) Due to this being a telephonic  visit, with patients personalized plan was offered to patient but patient Declined AVS at this time   Notes: Patient and husband aware of current care gaps.  Follow up appointment scheduled to see PCP 04/28/2024.

## 2024-04-13 NOTE — Patient Instructions (Signed)
 Ms. Lucado , Thank you for taking time out of your busy schedule to complete your Annual Wellness Visit with me. I enjoyed our conversation and look forward to speaking with you again next year. I, as well as your care team,  appreciate your ongoing commitment to your health goals. Please review the following plan we discussed and let me know if I can assist you in the future. Your Game plan/ To Do List    Referrals: If you haven't heard from the office you've been referred to, please reach out to them at the phone provided.   Follow up Visits: Next Medicare AWV with our clinical staff: 04/15/2025 at 8:30 a.m. PHONE VISIT with Nurse   Have you seen your provider in the last 6 months (3 months if uncontrolled diabetes)? Yes Next Office Visit with your provider: 04/28/2024 at 9:30 a.m. with Dr. Goble Last  Clinician Recommendations:  Aim for 30 minutes of exercise or brisk walking, 6-8 glasses of water, and 5 servings of fruits and vegetables each day.       This is a list of the screening recommended for you and due dates:  Health Maintenance  Topic Date Due   Eye exam for diabetics  Never done   Mammogram  Never done   Zoster (Shingles) Vaccine (1 of 2) Never done   DTaP/Tdap/Td vaccine (2 - Tdap) 09/01/2015   DEXA scan (bone density measurement)  Never done   Complete foot exam   06/19/2019   Colon Cancer Screening  07/14/2023   COVID-19 Vaccine (4 - 2024-25 season) 07/28/2023   Yearly kidney function blood test for diabetes  04/14/2024   Yearly kidney health urinalysis for diabetes  04/14/2024   Hemoglobin A1C  05/01/2024   Flu Shot  06/26/2024   Medicare Annual Wellness Visit  04/13/2025   Pneumonia Vaccine  Completed   Hepatitis C Screening  Completed   HPV Vaccine  Aged Out   Meningitis B Vaccine  Aged Out    Advanced directives: (Declined) Advance directive discussed with you today. Even though you declined this today, please call our office should you change your mind, and  we can give you the proper paperwork for you to fill out. Advance Care Planning is important because it:  [x]  Makes sure you receive the medical care that is consistent with your values, goals, and preferences  [x]  It provides guidance to your family and loved ones and reduces their decisional burden about whether or not they are making the right decisions based on your wishes.  Follow the link provided in your after visit summary or read over the paperwork we have mailed to you to help you started getting your Advance Directives in place. If you need assistance in completing these, please reach out to us  so that we can help you!  See attachments for Preventive Care and Fall Prevention Tips.

## 2024-04-16 ENCOUNTER — Other Ambulatory Visit: Payer: Self-pay | Admitting: Student

## 2024-04-16 DIAGNOSIS — E119 Type 2 diabetes mellitus without complications: Secondary | ICD-10-CM

## 2024-04-16 DIAGNOSIS — I10 Essential (primary) hypertension: Secondary | ICD-10-CM

## 2024-04-22 ENCOUNTER — Other Ambulatory Visit: Payer: Self-pay | Admitting: Student

## 2024-04-22 DIAGNOSIS — E119 Type 2 diabetes mellitus without complications: Secondary | ICD-10-CM

## 2024-04-22 DIAGNOSIS — I10 Essential (primary) hypertension: Secondary | ICD-10-CM

## 2024-04-28 ENCOUNTER — Ambulatory Visit (INDEPENDENT_AMBULATORY_CARE_PROVIDER_SITE_OTHER): Admitting: Student

## 2024-04-28 ENCOUNTER — Encounter: Payer: Self-pay | Admitting: Student

## 2024-04-28 VITALS — BP 184/86 | HR 65 | Wt 156.4 lb

## 2024-04-28 DIAGNOSIS — E119 Type 2 diabetes mellitus without complications: Secondary | ICD-10-CM | POA: Diagnosis present

## 2024-04-28 DIAGNOSIS — I152 Hypertension secondary to endocrine disorders: Secondary | ICD-10-CM | POA: Diagnosis not present

## 2024-04-28 DIAGNOSIS — E1159 Type 2 diabetes mellitus with other circulatory complications: Secondary | ICD-10-CM | POA: Diagnosis not present

## 2024-04-28 DIAGNOSIS — E038 Other specified hypothyroidism: Secondary | ICD-10-CM

## 2024-04-28 LAB — POCT GLYCOSYLATED HEMOGLOBIN (HGB A1C): HbA1c, POC (controlled diabetic range): 7.6 % — AB (ref 0.0–7.0)

## 2024-04-28 MED ORDER — AMLODIPINE BESYLATE-VALSARTAN 5-320 MG PO TABS
1.0000 | ORAL_TABLET | Freq: Every day | ORAL | 0 refills | Status: DC
Start: 2024-04-28 — End: 2024-06-09

## 2024-04-28 MED ORDER — METFORMIN HCL 1000 MG PO TABS: 1000.0000 mg | ORAL_TABLET | Freq: Two times a day (BID) | ORAL | 5 refills | Status: DC

## 2024-04-28 NOTE — Patient Instructions (Addendum)
 Pleasure to see you today.  Blood pressure today was elevated and I suspect this is because you have been out of your blood pressure medication.  I have called your pharmacy who said you can come pick up your new blood pressure medication which is Exforge  you will take 1 tablets daily.  Please make sure you are checking your blood pressure daily, keep a blood pressure log and follow-up in 2 weeks let us  recheck your blood pressure.   Your A1c today was elevated to 7.6%.  I have increased your metformin  to 2 tablets 2 times daily.  Also we collected lab to check your thyroid  level today.  You are currently due for your colonoscopy.  I have placed a referral to a stomach doctor they will call you to schedule that appointment.

## 2024-04-28 NOTE — Progress Notes (Signed)
    SUBJECTIVE:   CHIEF COMPLAINT / HPI:   [name] is a 73 year old female with hypertension and diabetes who presents with elevated blood pressure and increased A1c levels.  She has been out of her prescribed losartan  for the past two weeks due to difficulties in obtaining the medication from the pharmacy. Her A1c level has increased from 6.5 to 7.6. She is currently taking metformin , one tablet in the morning and one at night.  PERTINENT  PMH / PSH: Reviewed  OBJECTIVE:   BP (!) 184/86   Pulse 65   Wt 156 lb 6.4 oz (70.9 kg)   SpO2 97%   BMI 27.27 kg/m    Physical Exam General: Alert, well appearing, NAD, Oriented x4 Cardiovascular: RRR, No Murmurs, Normal S2/S2 Respiratory: CTAB, No wheezing or Rales Abdomen: No distension or tenderness Extremities: No edema on extremities   Skin: Warm and dry  ASSESSMENT/PLAN:   Hypertension associated with diabetes (HCC) BP elevated x 2 today.  Poorly controlled mostly due to being out of medication for the past few weeks.  Called [facility] to clarify the patient can pick up the Exforge  without any issues with insurance as she has described in the past. - Patient to start Exforge  daily - Commend keeping a daily blood pressure logs. - Follow-up in 2 weeks and at that time we will check BMP  Diabetes mellitus type 2, controlled (HCC) Hemoglobin A1c increased to 7.6%.  Although at goal for patient to expected goal is less than 8 shared decision patient and husband is amenable to increasing metformin  to keep blood glucose better controlled.   - Increase metformin  to 1000 mg twice daily - Implement new diet plan to manage blood glucose levels. - Provide written instructions for medication adjustment.  Hypothyroidism Previous labs consistent with hypothyroidism and patient instructured on how to take her medication. - Recheck TSH today.     Goble Last, MD [facility]

## 2024-04-28 NOTE — Assessment & Plan Note (Addendum)
 Previous labs consistent with hypothyroidism and patient instructured on how to take her medication. - Recheck TSH today.

## 2024-04-28 NOTE — Assessment & Plan Note (Signed)
 Hemoglobin A1c increased to 7.6%.  Although at goal for patient to expected goal is less than 8 shared decision patient and husband is amenable to increasing metformin  to keep blood glucose better controlled.   - Increase metformin  to 1000 mg twice daily - Implement new diet plan to manage blood glucose levels. - Provide written instructions for medication adjustment.

## 2024-04-28 NOTE — Assessment & Plan Note (Signed)
 BP elevated x 2 today.  Poorly controlled mostly due to being out of medication for the past few weeks.  Called [facility] to clarify the patient can pick up the Exforge  without any issues with insurance as she has described in the past. - Patient to start Exforge  daily - Commend keeping a daily blood pressure logs. - Follow-up in 2 weeks and at that time we will check BMP

## 2024-04-29 LAB — MICROALBUMIN / CREATININE URINE RATIO
Creatinine, Urine: 44.7 mg/dL
Microalb/Creat Ratio: 57 mg/g{creat} — ABNORMAL HIGH (ref 0–29)
Microalbumin, Urine: 25.7 ug/mL

## 2024-04-29 LAB — TSH: TSH: 1.78 u[IU]/mL (ref 0.450–4.500)

## 2024-04-30 ENCOUNTER — Other Ambulatory Visit: Payer: Self-pay | Admitting: Student

## 2024-05-10 ENCOUNTER — Other Ambulatory Visit: Payer: Self-pay | Admitting: Student

## 2024-05-10 DIAGNOSIS — E039 Hypothyroidism, unspecified: Secondary | ICD-10-CM

## 2024-05-18 ENCOUNTER — Encounter: Payer: Self-pay | Admitting: Student

## 2024-05-18 ENCOUNTER — Ambulatory Visit (INDEPENDENT_AMBULATORY_CARE_PROVIDER_SITE_OTHER): Admitting: Student

## 2024-05-18 VITALS — BP 135/86 | HR 68 | Ht 62.5 in | Wt 153.6 lb

## 2024-05-18 DIAGNOSIS — E1159 Type 2 diabetes mellitus with other circulatory complications: Secondary | ICD-10-CM

## 2024-05-18 DIAGNOSIS — Z1211 Encounter for screening for malignant neoplasm of colon: Secondary | ICD-10-CM | POA: Diagnosis present

## 2024-05-18 DIAGNOSIS — Z1231 Encounter for screening mammogram for malignant neoplasm of breast: Secondary | ICD-10-CM

## 2024-05-18 DIAGNOSIS — I152 Hypertension secondary to endocrine disorders: Secondary | ICD-10-CM | POA: Diagnosis not present

## 2024-05-18 NOTE — Assessment & Plan Note (Signed)
 BP well controlled.  Patient endorses good tolerance to medication. - Continue current antihypertensive medication regimen - Continue home BP monitoring - Return precautions and hypotension precaution discussed with elderly patient

## 2024-05-18 NOTE — Progress Notes (Signed)
    SUBJECTIVE:   CHIEF COMPLAINT / HPI:   [name] is a 73 year old female with hypertension who presents for blood pressure management.  Her blood pressure remains elevated at 190/105 mmHg, consistent with home readings. She is on a modified medication regimen and has reduced her salt intake.  She has low thyroid  levels noted two to three months ago, resulting in a weight gain of about ten pounds. She is on thyroid  medication.  PERTINENT  PMH / PSH: Reviewed  OBJECTIVE:   BP (!) 145/70   Pulse 68   Ht 5' 2.5 (1.588 m)   Wt 153 lb 9.6 oz (69.7 kg)   SpO2 98%   BMI 27.65 kg/m    Physical Exam General: Alert, well appearing, NAD Cardiovascular: RRR, No Murmurs, Normal S2/S2 Respiratory: CTAB, No wheezing or Rales Abdomen: No distension or tenderness Extremities: No edema on extremities   Skin: Warm and dry  ASSESSMENT/PLAN:   Hypertension associated with diabetes (HCC) BP well controlled.  Patient endorses good tolerance to medication. - Continue current antihypertensive medication regimen - Continue home BP monitoring - Return precautions and hypotension precaution discussed with elderly patient   Colon cancer screening Due for colon cancer screening.  Last done 2021, found and removed adenomas x 7 with plan to return in 2024. - Referral placed to GI for colonoscopy  Breast cancer screening Patient due for breast cancer screening. - Screening mammogram ordered and contact provided to patient.  Norleen April, MD [facility]

## 2024-05-18 NOTE — Patient Instructions (Signed)
 Pleasure to meet you today.  Your blood pressure is much improved and appropriate for your age.  He is continue to take your blood pressure and even if it starts getting high please return to be reevaluated.  Today I placed referral to gastroenterologist for your colon cancer screening.  I have also placed order for breast mammogram to screen for breast cancer.  Please you can call this number [phone] to schedule an appointment.  This would be with the [facility] breast center at [phone].

## 2024-05-26 ENCOUNTER — Other Ambulatory Visit: Payer: Self-pay

## 2024-05-26 DIAGNOSIS — E119 Type 2 diabetes mellitus without complications: Secondary | ICD-10-CM

## 2024-05-26 MED ORDER — METFORMIN HCL 1000 MG PO TABS: 1000.0000 mg | ORAL_TABLET | Freq: Two times a day (BID) | ORAL | 5 refills | Status: DC

## 2024-05-27 ENCOUNTER — Other Ambulatory Visit: Payer: Self-pay | Admitting: Student

## 2024-06-09 ENCOUNTER — Other Ambulatory Visit: Payer: Self-pay | Admitting: Student

## 2024-06-09 DIAGNOSIS — E1159 Type 2 diabetes mellitus with other circulatory complications: Secondary | ICD-10-CM

## 2024-06-17 NOTE — Telephone Encounter (Signed)
 Called patient and scheduled follow up visit on 06/26/24.  Isabella JAYSON English, RN

## 2024-06-26 ENCOUNTER — Ambulatory Visit: Admitting: Family Medicine

## 2024-06-29 ENCOUNTER — Other Ambulatory Visit: Payer: Self-pay | Admitting: Student

## 2024-08-02 ENCOUNTER — Other Ambulatory Visit: Payer: Self-pay | Admitting: Student

## 2024-08-02 DIAGNOSIS — E785 Hyperlipidemia, unspecified: Secondary | ICD-10-CM

## 2024-08-12 ENCOUNTER — Other Ambulatory Visit: Payer: Self-pay | Admitting: Student

## 2024-08-12 ENCOUNTER — Other Ambulatory Visit: Payer: Self-pay | Admitting: Family Medicine

## 2024-08-12 DIAGNOSIS — E039 Hypothyroidism, unspecified: Secondary | ICD-10-CM

## 2024-08-12 DIAGNOSIS — G479 Sleep disorder, unspecified: Secondary | ICD-10-CM

## 2024-09-18 ENCOUNTER — Other Ambulatory Visit: Payer: Self-pay | Admitting: Family Medicine

## 2024-09-18 DIAGNOSIS — I152 Hypertension secondary to endocrine disorders: Secondary | ICD-10-CM

## 2024-10-13 ENCOUNTER — Other Ambulatory Visit: Payer: Self-pay | Admitting: Family Medicine

## 2024-11-06 ENCOUNTER — Other Ambulatory Visit: Payer: Self-pay | Admitting: Family Medicine

## 2024-11-06 DIAGNOSIS — E039 Hypothyroidism, unspecified: Secondary | ICD-10-CM

## 2024-11-18 ENCOUNTER — Other Ambulatory Visit: Payer: Self-pay | Admitting: Family Medicine

## 2024-11-18 DIAGNOSIS — G479 Sleep disorder, unspecified: Secondary | ICD-10-CM

## 2024-11-26 ENCOUNTER — Other Ambulatory Visit: Payer: Self-pay | Admitting: Family Medicine

## 2024-11-26 DIAGNOSIS — E119 Type 2 diabetes mellitus without complications: Secondary | ICD-10-CM

## 2024-11-26 MED ORDER — METFORMIN HCL 1000 MG PO TABS: 1000.0000 mg | ORAL_TABLET | Freq: Two times a day (BID) | ORAL | 5 refills | Status: AC

## 2024-11-26 NOTE — Progress Notes (Signed)
 Pharmacy faxed notice to PCP office regarding metformin  prescription non-adherence therapy advisory. Refilled metformin  as previously prescribed. Requesting patient to be seen in office before next refill.

## 2024-12-16 ENCOUNTER — Ambulatory Visit: Payer: Self-pay | Admitting: Family Medicine

## 2024-12-16 ENCOUNTER — Encounter: Payer: Self-pay | Admitting: Family Medicine

## 2024-12-16 VITALS — BP 172/84 | HR 62 | Ht 62.5 in | Wt 153.0 lb

## 2024-12-16 DIAGNOSIS — E349 Endocrine disorder, unspecified: Secondary | ICD-10-CM | POA: Diagnosis not present

## 2024-12-16 DIAGNOSIS — E119 Type 2 diabetes mellitus without complications: Secondary | ICD-10-CM

## 2024-12-16 DIAGNOSIS — Z1211 Encounter for screening for malignant neoplasm of colon: Secondary | ICD-10-CM | POA: Diagnosis not present

## 2024-12-16 DIAGNOSIS — M818 Other osteoporosis without current pathological fracture: Secondary | ICD-10-CM

## 2024-12-16 DIAGNOSIS — E1159 Type 2 diabetes mellitus with other circulatory complications: Secondary | ICD-10-CM

## 2024-12-16 DIAGNOSIS — E039 Hypothyroidism, unspecified: Secondary | ICD-10-CM

## 2024-12-16 DIAGNOSIS — I152 Hypertension secondary to endocrine disorders: Secondary | ICD-10-CM | POA: Diagnosis not present

## 2024-12-16 LAB — POCT GLYCOSYLATED HEMOGLOBIN (HGB A1C): HbA1c, POC (controlled diabetic range): 7.1 % — AB (ref 0.0–7.0)

## 2024-12-16 MED ORDER — AMLODIPINE BESYLATE-VALSARTAN 10-320 MG PO TABS: 1.0000 | ORAL_TABLET | Freq: Every day | ORAL | 1 refills | Status: AC

## 2024-12-16 NOTE — Assessment & Plan Note (Signed)
 Chronically poorly controlled. - Increase Exforge  to 10-320 mg from 5-320 mg - BMP

## 2024-12-16 NOTE — Patient Instructions (Addendum)
 It was great to see you today! Thank you for choosing [facility] for your primary care.  Today we addressed: Diabetes Your diabetes is better controlled today and at goal of A1c less than 8.  Your A1c is 7.1.  You need a bone density scan.  Please schedule an appointment.  You can call [phone].      You had a referral placed for a colonoscopy, they will call you to set up an appointment. Please give us  a call if you don't hear back in the next 2 weeks.  And we are checking some labs today.  I will give you a call if there are abnormal.  You should return to our clinic in 1 month for blood pressure follow-up.  Thank you for coming to see us  at [facility] and for the opportunity to care for you! Caterina Racine, MD 12/16/2024, 10:16 AM

## 2024-12-16 NOTE — Assessment & Plan Note (Signed)
-   Routine TSH

## 2024-12-16 NOTE — Progress Notes (Signed)
" ° °  SUBJECTIVE:   CHIEF COMPLAINT / HPI:  [name] is a 74 y.o. female with a pertinent past medical history of T2DM, HLD, hypothyroidism presenting to the clinic for T2DM follow up and routine screening.  Type 2 diabetes mellitus - Prior A1c 7.6 in June 2025 - Medications: Metformin  1000 mg BID - Hypoglycemia plan: Verbalized - Adherence: Excellent - Eye exam: UTD - Foot exam: DUE - Microalbumin: DUE - Statin: Atorvastatin  80 mg - No symptoms of hypoglycemia, polyuria, polydipsia, numbness of extremities, foot ulcers/trauma  Hypertension - BP: (!) 172/84 today. - Home medications include: Exforge  (amlodipine -valsartan ) 5-320 mg. - She endorses taking these medications as prescribed. - Does not check blood pressure at home. - Denies hypotension symptoms including dizziness and orthostatic symptoms. - Denies headache, vision changes, LUQ pain, hematuria, chest pain.  Most recent creatinine trend:  Lab Results  Component Value Date   CREATININE 0.73 04/15/2023   CREATININE 0.74 04/11/2021   CREATININE 0.54 04/09/2021    PERTINENT PMH / PSH: Hypothyroidism T2DM, HLD Short term memory loss, unclear reason Hx partial seizure  *Remainder reviewed in problem list.   OBJECTIVE:   BP (!) 172/84   Pulse 62   Ht 5' 2.5 (1.588 m)   Wt 153 lb (69.4 kg)   SpO2 97%   BMI 27.54 kg/m   General: Age-appropriate, resting comfortably in chair, NAD, alert and at baseline. Neck: Smooth, nonpalpable thyroid . Cardiovascular: Regular rate and rhythm. Normal S1/S2. No murmurs, rubs, or gallops appreciated. 2+ radial pulses. Pulmonary: Clear bilaterally to ascultation. No wheezes, crackles, or rhonchi. Normal WOB on room air. No accessory muscle use. Skin: Warm and dry. Extremities: No peripheral edema bilaterally. Capillary refill <2 seconds.  Diabetic Foot Exam - Simple   Simple Foot Form Diabetic Foot exam was performed with the following findings: Yes 12/16/2024 10:06 AM   Visual Inspection No deformities, no ulcerations, no other skin breakdown bilaterally: Yes Sensation Testing Intact to touch and monofilament testing bilaterally: Yes Pulse Check Posterior Tibialis and Dorsalis pulse intact bilaterally: Yes Comments Diabetic foot exam with monofilament shows intact sensation and discrimination over all toes and plantar surfaces of feet bilaterally.  No ulcers or calluses present.     Results for orders placed or performed in visit on 12/16/24 (from the past 48 hours)  HgB A1c     Status: Abnormal   Collection Time: 12/16/24  9:57 AM  Result Value Ref Range   Hemoglobin A1C     HbA1c POC (<> result, manual entry)     HbA1c, POC (prediabetic range)     HbA1c, POC (controlled diabetic range) 7.1 (A) 0.0 - 7.0 %    ASSESSMENT/PLAN:   Assessment & Plan Type 2 diabetes mellitus without complication, without long-term current use of insulin  (HCC) A1c 7.1, goal <8. - Urine microalbumin/Cr - Foot exam unremarkable as above - BMP - Continue metformin  1000 mg BID Osteoporosis due to endocrine disorder Hypothyroidism risk factor.  Due for DEXA. - DEXA ordered, [facility] phone number provided Hypothyroidism, unspecified type - Routine TSH Hypertension associated with diabetes (HCC) Chronically poorly controlled. - Increase Exforge  to 10-320 mg from 5-320 mg - BMP Colon cancer screening - Amb ref GI  Return in about 1 month (around 01/16/2025) for HTN follow up.  Layth Cerezo Toma, MD [facility] "

## 2024-12-17 ENCOUNTER — Ambulatory Visit: Payer: Self-pay | Admitting: Family Medicine

## 2024-12-17 LAB — BASIC METABOLIC PANEL WITH GFR
BUN/Creatinine Ratio: 17 (ref 12–28)
BUN: 10 mg/dL (ref 8–27)
CO2: 24 mmol/L (ref 20–29)
Calcium: 9.3 mg/dL (ref 8.7–10.3)
Chloride: 101 mmol/L (ref 96–106)
Creatinine, Ser: 0.59 mg/dL (ref 0.57–1.00)
Glucose: 140 mg/dL — ABNORMAL HIGH (ref 70–99)
Potassium: 3.8 mmol/L (ref 3.5–5.2)
Sodium: 140 mmol/L (ref 134–144)
eGFR: 95 mL/min/1.73

## 2024-12-17 LAB — MICROALBUMIN / CREATININE URINE RATIO
Creatinine, Urine: 34.6 mg/dL
Microalb/Creat Ratio: 49 mg/g{creat} — ABNORMAL HIGH (ref 0–29)
Microalbumin, Urine: 16.9 ug/mL

## 2024-12-17 LAB — TSH: TSH: 1.79 u[IU]/mL (ref 0.450–4.500)

## 2025-01-15 ENCOUNTER — Ambulatory Visit: Payer: Self-pay | Admitting: Family Medicine

## 2025-04-15 ENCOUNTER — Encounter
